# Patient Record
Sex: Female | Born: 1940 | Hispanic: Yes | Marital: Married | Smoking: Former smoker
Health system: Southern US, Community
[De-identification: ages and names within clinical notes are randomized; demographics above are authoritative.]

## PROBLEM LIST (undated history)

## (undated) DIAGNOSIS — M1990 Unspecified osteoarthritis, unspecified site: Secondary | ICD-10-CM

## (undated) DIAGNOSIS — K648 Other hemorrhoids: Secondary | ICD-10-CM

## (undated) DIAGNOSIS — R109 Unspecified abdominal pain: Secondary | ICD-10-CM

## (undated) DIAGNOSIS — K5790 Diverticulosis of intestine, part unspecified, without perforation or abscess without bleeding: Secondary | ICD-10-CM

## (undated) DIAGNOSIS — E785 Hyperlipidemia, unspecified: Secondary | ICD-10-CM

## (undated) DIAGNOSIS — K5730 Diverticulosis of large intestine without perforation or abscess without bleeding: Secondary | ICD-10-CM

## (undated) DIAGNOSIS — I10 Essential (primary) hypertension: Secondary | ICD-10-CM

## (undated) DIAGNOSIS — K2270 Barrett's esophagus without dysplasia: Secondary | ICD-10-CM

## (undated) DIAGNOSIS — K219 Gastro-esophageal reflux disease without esophagitis: Secondary | ICD-10-CM

## (undated) HISTORY — DX: Unspecified abdominal pain: R10.9

## (undated) HISTORY — PX: ABDOMINAL HYSTERECTOMY: SHX81

## (undated) HISTORY — DX: Unspecified osteoarthritis, unspecified site: M19.90

## (undated) HISTORY — DX: Barrett's esophagus without dysplasia: K22.70

## (undated) HISTORY — DX: Essential (primary) hypertension: I10

## (undated) HISTORY — PX: UPPER GASTROINTESTINAL ENDOSCOPY: SHX188

## (undated) HISTORY — DX: Hyperlipidemia, unspecified: E78.5

## (undated) HISTORY — DX: Diverticulosis of large intestine without perforation or abscess without bleeding: K57.30

## (undated) HISTORY — DX: Gastro-esophageal reflux disease without esophagitis: K21.9

## (undated) HISTORY — PX: LUMBAR LAMINECTOMY: SHX95

## (undated) HISTORY — PX: KNEE ARTHROSCOPY: SUR90

## (undated) HISTORY — PX: COLONOSCOPY: SHX174

## (undated) HISTORY — DX: Other hemorrhoids: K64.8

## (undated) HISTORY — DX: Diverticulosis of intestine, part unspecified, without perforation or abscess without bleeding: K57.90

---

## 1997-08-22 ENCOUNTER — Ambulatory Visit (HOSPITAL_COMMUNITY): Admission: RE | Admit: 1997-08-22 | Discharge: 1997-08-22 | Payer: Self-pay | Admitting: Internal Medicine

## 1997-10-31 ENCOUNTER — Ambulatory Visit (HOSPITAL_BASED_OUTPATIENT_CLINIC_OR_DEPARTMENT_OTHER): Admission: RE | Admit: 1997-10-31 | Discharge: 1997-10-31 | Payer: Self-pay | Admitting: *Deleted

## 1998-02-20 ENCOUNTER — Encounter: Admission: RE | Admit: 1998-02-20 | Discharge: 1998-05-21 | Payer: Self-pay | Admitting: Internal Medicine

## 1999-09-28 ENCOUNTER — Encounter: Admission: RE | Admit: 1999-09-28 | Discharge: 1999-09-28 | Payer: Self-pay | Admitting: Internal Medicine

## 1999-09-28 ENCOUNTER — Encounter: Payer: Self-pay | Admitting: Internal Medicine

## 2000-05-17 ENCOUNTER — Encounter: Payer: Self-pay | Admitting: *Deleted

## 2000-05-17 ENCOUNTER — Ambulatory Visit (HOSPITAL_COMMUNITY): Admission: RE | Admit: 2000-05-17 | Discharge: 2000-05-17 | Payer: Self-pay | Admitting: *Deleted

## 2000-06-09 ENCOUNTER — Encounter: Payer: Self-pay | Admitting: *Deleted

## 2000-06-09 ENCOUNTER — Encounter: Admission: RE | Admit: 2000-06-09 | Discharge: 2000-06-09 | Payer: Self-pay | Admitting: *Deleted

## 2000-06-23 ENCOUNTER — Encounter: Admission: RE | Admit: 2000-06-23 | Discharge: 2000-06-23 | Payer: Self-pay | Admitting: *Deleted

## 2000-06-23 ENCOUNTER — Encounter: Payer: Self-pay | Admitting: *Deleted

## 2000-11-24 ENCOUNTER — Ambulatory Visit (HOSPITAL_COMMUNITY): Admission: RE | Admit: 2000-11-24 | Discharge: 2000-11-24 | Payer: Self-pay

## 2000-12-12 DIAGNOSIS — K2270 Barrett's esophagus without dysplasia: Secondary | ICD-10-CM

## 2000-12-12 HISTORY — DX: Barrett's esophagus without dysplasia: K22.70

## 2000-12-13 ENCOUNTER — Other Ambulatory Visit: Admission: RE | Admit: 2000-12-13 | Discharge: 2000-12-13 | Payer: Self-pay | Admitting: Gastroenterology

## 2000-12-13 ENCOUNTER — Encounter (INDEPENDENT_AMBULATORY_CARE_PROVIDER_SITE_OTHER): Payer: Self-pay

## 2002-11-21 ENCOUNTER — Ambulatory Visit (HOSPITAL_COMMUNITY): Admission: RE | Admit: 2002-11-21 | Discharge: 2002-11-21 | Payer: Self-pay | Admitting: *Deleted

## 2004-01-03 ENCOUNTER — Ambulatory Visit: Payer: Self-pay | Admitting: Internal Medicine

## 2004-06-26 ENCOUNTER — Ambulatory Visit: Payer: Self-pay | Admitting: Internal Medicine

## 2005-09-23 ENCOUNTER — Ambulatory Visit: Payer: Self-pay | Admitting: Internal Medicine

## 2005-11-12 ENCOUNTER — Ambulatory Visit: Payer: Self-pay | Admitting: Internal Medicine

## 2006-02-10 ENCOUNTER — Ambulatory Visit: Payer: Self-pay | Admitting: Internal Medicine

## 2006-03-15 ENCOUNTER — Ambulatory Visit: Payer: Self-pay | Admitting: Internal Medicine

## 2006-04-26 ENCOUNTER — Ambulatory Visit: Payer: Self-pay | Admitting: Internal Medicine

## 2006-04-26 LAB — CONVERTED CEMR LAB: Hgb A1c MFr Bld: 7.4 % — ABNORMAL HIGH (ref 4.6–6.0)

## 2006-07-26 ENCOUNTER — Ambulatory Visit: Payer: Self-pay | Admitting: Internal Medicine

## 2006-07-26 DIAGNOSIS — Z9889 Other specified postprocedural states: Secondary | ICD-10-CM | POA: Insufficient documentation

## 2006-07-26 DIAGNOSIS — E119 Type 2 diabetes mellitus without complications: Secondary | ICD-10-CM | POA: Insufficient documentation

## 2006-07-26 DIAGNOSIS — I10 Essential (primary) hypertension: Secondary | ICD-10-CM

## 2006-07-26 DIAGNOSIS — K5730 Diverticulosis of large intestine without perforation or abscess without bleeding: Secondary | ICD-10-CM

## 2006-07-26 DIAGNOSIS — E785 Hyperlipidemia, unspecified: Secondary | ICD-10-CM | POA: Insufficient documentation

## 2006-07-26 HISTORY — DX: Other specified postprocedural states: Z98.890

## 2006-07-26 HISTORY — DX: Essential (primary) hypertension: I10

## 2006-07-26 HISTORY — DX: Type 2 diabetes mellitus without complications: E11.9

## 2006-07-26 HISTORY — DX: Diverticulosis of large intestine without perforation or abscess without bleeding: K57.30

## 2006-10-26 ENCOUNTER — Encounter: Payer: Self-pay | Admitting: Internal Medicine

## 2006-12-12 ENCOUNTER — Encounter: Payer: Self-pay | Admitting: Internal Medicine

## 2007-02-16 ENCOUNTER — Ambulatory Visit: Payer: Self-pay | Admitting: Internal Medicine

## 2007-02-16 LAB — CONVERTED CEMR LAB: Blood Glucose, Fingerstick: 227

## 2007-02-20 ENCOUNTER — Telehealth: Payer: Self-pay | Admitting: *Deleted

## 2007-02-21 ENCOUNTER — Encounter: Payer: Self-pay | Admitting: Internal Medicine

## 2007-03-24 ENCOUNTER — Ambulatory Visit: Payer: Self-pay | Admitting: Internal Medicine

## 2007-03-24 DIAGNOSIS — J069 Acute upper respiratory infection, unspecified: Secondary | ICD-10-CM | POA: Insufficient documentation

## 2007-05-16 ENCOUNTER — Ambulatory Visit: Payer: Self-pay | Admitting: Internal Medicine

## 2007-05-16 LAB — CONVERTED CEMR LAB
Albumin: 3.7 g/dL (ref 3.5–5.2)
Basophils Absolute: 0.1 10*3/uL (ref 0.0–0.1)
Blood Glucose, Fingerstick: 130
Cholesterol: 168 mg/dL (ref 0–200)
Creatinine,U: 140.2 mg/dL
Eosinophils Absolute: 0.2 10*3/uL (ref 0.0–0.6)
GFR calc Af Amer: 108 mL/min
GFR calc non Af Amer: 89 mL/min
HCT: 38.2 % (ref 36.0–46.0)
HDL: 55.3 mg/dL (ref >39.0)
Hemoglobin: 12.7 g/dL (ref 12.0–15.0)
Hgb A1c MFr Bld: 7.4 % — ABNORMAL HIGH (ref 4.6–6.0)
MCHC: 33.4 g/dL (ref 30.0–36.0)
MCV: 89.6 fL (ref 78.0–100.0)
Microalb, Ur: 1.1 mg/dL (ref 0.0–1.9)
Monocytes Absolute: 0.4 10*3/uL (ref 0.2–0.7)
Neutrophils Relative %: 51.9 % (ref 43.0–77.0)
Potassium: 4.2 meq/L (ref 3.5–5.1)
Sodium: 141 meq/L (ref 135–145)
TSH: 2.45 microintl units/mL (ref 0.35–5.50)
Total CHOL/HDL Ratio: 3

## 2007-07-21 ENCOUNTER — Telehealth (INDEPENDENT_AMBULATORY_CARE_PROVIDER_SITE_OTHER): Payer: Self-pay

## 2007-09-04 ENCOUNTER — Ambulatory Visit: Payer: Self-pay | Admitting: Internal Medicine

## 2007-09-04 DIAGNOSIS — R002 Palpitations: Secondary | ICD-10-CM

## 2007-09-04 DIAGNOSIS — M1990 Unspecified osteoarthritis, unspecified site: Secondary | ICD-10-CM | POA: Insufficient documentation

## 2007-09-04 HISTORY — DX: Palpitations: R00.2

## 2007-11-30 ENCOUNTER — Ambulatory Visit: Payer: Self-pay | Admitting: Internal Medicine

## 2008-02-27 ENCOUNTER — Ambulatory Visit: Payer: Self-pay | Admitting: Internal Medicine

## 2008-02-29 ENCOUNTER — Telehealth (INDEPENDENT_AMBULATORY_CARE_PROVIDER_SITE_OTHER): Payer: Self-pay

## 2008-02-29 ENCOUNTER — Telehealth (INDEPENDENT_AMBULATORY_CARE_PROVIDER_SITE_OTHER): Payer: Self-pay | Admitting: *Deleted

## 2008-03-21 ENCOUNTER — Telehealth: Payer: Self-pay | Admitting: Internal Medicine

## 2008-05-28 ENCOUNTER — Ambulatory Visit: Payer: Self-pay | Admitting: Internal Medicine

## 2008-06-03 ENCOUNTER — Telehealth: Payer: Self-pay | Admitting: Internal Medicine

## 2008-06-03 ENCOUNTER — Encounter: Payer: Self-pay | Admitting: Internal Medicine

## 2008-06-03 ENCOUNTER — Encounter: Admission: RE | Admit: 2008-06-03 | Discharge: 2008-09-01 | Payer: Self-pay | Admitting: Internal Medicine

## 2008-06-25 ENCOUNTER — Ambulatory Visit: Payer: Self-pay | Admitting: Internal Medicine

## 2008-06-25 DIAGNOSIS — K219 Gastro-esophageal reflux disease without esophagitis: Secondary | ICD-10-CM | POA: Insufficient documentation

## 2008-11-07 ENCOUNTER — Ambulatory Visit: Payer: Self-pay | Admitting: Internal Medicine

## 2008-11-07 LAB — CONVERTED CEMR LAB
Cholesterol: 188 mg/dL (ref 0–200)
Hgb A1c MFr Bld: 6.9 % — ABNORMAL HIGH (ref 4.6–6.5)

## 2009-01-17 ENCOUNTER — Ambulatory Visit: Payer: Self-pay | Admitting: Family Medicine

## 2009-01-17 DIAGNOSIS — H81399 Other peripheral vertigo, unspecified ear: Secondary | ICD-10-CM | POA: Insufficient documentation

## 2009-01-27 ENCOUNTER — Encounter: Payer: Self-pay | Admitting: Internal Medicine

## 2009-01-27 ENCOUNTER — Encounter (INDEPENDENT_AMBULATORY_CARE_PROVIDER_SITE_OTHER): Payer: Self-pay | Admitting: *Deleted

## 2009-01-29 ENCOUNTER — Telehealth: Payer: Self-pay | Admitting: Family Medicine

## 2009-03-10 ENCOUNTER — Ambulatory Visit: Payer: Self-pay | Admitting: Internal Medicine

## 2009-06-17 ENCOUNTER — Ambulatory Visit: Payer: Self-pay | Admitting: Internal Medicine

## 2009-07-29 ENCOUNTER — Telehealth: Payer: Self-pay | Admitting: Internal Medicine

## 2009-09-11 ENCOUNTER — Telehealth: Payer: Self-pay | Admitting: Internal Medicine

## 2009-09-23 ENCOUNTER — Ambulatory Visit: Payer: Self-pay | Admitting: Internal Medicine

## 2009-12-15 ENCOUNTER — Telehealth: Payer: Self-pay | Admitting: Internal Medicine

## 2009-12-26 ENCOUNTER — Ambulatory Visit: Payer: Self-pay | Admitting: Internal Medicine

## 2009-12-29 LAB — CONVERTED CEMR LAB
ALT: 51 units/L — ABNORMAL HIGH (ref 0–35)
AST: 38 units/L — ABNORMAL HIGH (ref 0–37)
Alkaline Phosphatase: 64 units/L (ref 39–117)
Calcium: 9.7 mg/dL (ref 8.4–10.5)
Creatinine,U: 23.5 mg/dL
Eosinophils Relative: 2.9 % (ref 0.0–5.0)
GFR calc non Af Amer: 79.22 mL/min (ref >60)
HCT: 36.9 % (ref 36.0–46.0)
Hemoglobin: 12.6 g/dL (ref 12.0–15.0)
LDL Cholesterol: 102 mg/dL — ABNORMAL HIGH (ref 0–99)
Lymphocytes Relative: 37.6 % (ref 12.0–46.0)
Lymphs Abs: 3.1 10*3/uL (ref 0.7–4.0)
Microalb Creat Ratio: 1.3 mg/g (ref 0.0–30.0)
Monocytes Relative: 5.2 % (ref 3.0–12.0)
Neutro Abs: 4.4 10*3/uL (ref 1.4–7.7)
Platelets: 228 10*3/uL (ref 150.0–400.0)
Potassium: 5 meq/L (ref 3.5–5.1)
Sodium: 140 meq/L (ref 135–145)
TSH: 3.14 microintl units/mL (ref 0.35–5.50)
Total Bilirubin: 0.4 mg/dL (ref 0.3–1.2)
VLDL: 25.4 mg/dL (ref 0.0–40.0)
WBC: 8.3 10*3/uL (ref 4.5–10.5)

## 2010-02-25 ENCOUNTER — Telehealth: Payer: Self-pay | Admitting: Internal Medicine

## 2010-03-27 ENCOUNTER — Other Ambulatory Visit: Payer: Self-pay | Admitting: Internal Medicine

## 2010-03-27 ENCOUNTER — Ambulatory Visit
Admission: RE | Admit: 2010-03-27 | Discharge: 2010-03-27 | Payer: Self-pay | Source: Home / Self Care | Attending: Internal Medicine | Admitting: Internal Medicine

## 2010-03-27 LAB — HEMOGLOBIN A1C: Hgb A1c MFr Bld: 8 % — ABNORMAL HIGH (ref 4.6–6.5)

## 2010-03-29 ENCOUNTER — Encounter: Payer: Self-pay | Admitting: Internal Medicine

## 2010-04-01 ENCOUNTER — Telehealth: Payer: Self-pay

## 2010-04-02 ENCOUNTER — Ambulatory Visit
Admission: RE | Admit: 2010-04-02 | Discharge: 2010-04-02 | Payer: Self-pay | Source: Home / Self Care | Attending: Internal Medicine | Admitting: Internal Medicine

## 2010-04-02 ENCOUNTER — Encounter: Payer: Self-pay | Admitting: Internal Medicine

## 2010-04-02 DIAGNOSIS — R079 Chest pain, unspecified: Secondary | ICD-10-CM | POA: Insufficient documentation

## 2010-04-03 ENCOUNTER — Ambulatory Visit: Payer: Self-pay | Admitting: Internal Medicine

## 2010-04-03 ENCOUNTER — Other Ambulatory Visit: Payer: Self-pay | Admitting: Internal Medicine

## 2010-04-03 ENCOUNTER — Ambulatory Visit
Admission: RE | Admit: 2010-04-03 | Discharge: 2010-04-03 | Payer: Self-pay | Source: Home / Self Care | Attending: Internal Medicine | Admitting: Internal Medicine

## 2010-04-03 DIAGNOSIS — R799 Abnormal finding of blood chemistry, unspecified: Secondary | ICD-10-CM | POA: Insufficient documentation

## 2010-04-03 LAB — CREATININE, SERUM: Creatinine, Ser: 0.7 mg/dL (ref 0.4–1.2)

## 2010-04-03 LAB — BUN: BUN: 16 mg/dL (ref 6–23)

## 2010-04-05 LAB — CONVERTED CEMR LAB: Hgb A1c MFr Bld: 8.2 % — ABNORMAL HIGH (ref 4.6–6.0)

## 2010-04-07 NOTE — Assessment & Plan Note (Signed)
Summary: 3 month rov/njr   Vital Signs:  Patient profile:   70 year old female Weight:      175 pounds Temp:     98.3 degrees F oral BP sitting:   142 / 78  (left arm) Cuff size:   regular  Vitals Entered By: Raechel Ache, RN (March 10, 2009 8:36 AM) CC: 3 mo ROV, FBS 182. Is Patient Diabetic? Yes   CC:  3 mo ROV and FBS 182.Marland Kitchen  History of Present Illness: 70 year old patient who is seen today for follow up of her type 2 diabetes.  She has hypertension and dyslipidemia.  She admits to some dietary noncompliance or the holidays and feels that her blood sugars have tended up.  Her last hemoglobin A1c6.9.  She remains on statin therapy for dyslipidemia, which she continues to tolerate well.  She has treated hypertension that has been stable.  She denies any cardiopulmonary complaints.  Allergies: 1)  Penicillin G Potassium (Penicillin G Potassium)  Past History:  Past Medical History: Reviewed history from 06/25/2008 and no changes required. Diabetes mellitus, type II Hyperlipidemia Hypertension Diverticulosis, colon Osteoarthritis GERD  Past Surgical History: Reviewed history from 09/04/2007 and no changes required. Hysterectomy left salpingo-oophorectomy 1980 C-section x 3 Lumbar laminectomy 1989 arthroscopic left knee surgery 1999  colonoscopy 2002  Review of Systems  The patient denies anorexia, fever, weight loss, weight gain, vision loss, decreased hearing, hoarseness, chest pain, syncope, dyspnea on exertion, peripheral edema, prolonged cough, headaches, hemoptysis, abdominal pain, melena, hematochezia, severe indigestion/heartburn, hematuria, incontinence, genital sores, muscle weakness, suspicious skin lesions, transient blindness, difficulty walking, depression, unusual weight change, abnormal bleeding, enlarged lymph nodes, angioedema, and breast masses.    Physical Exam  General:  overweight-appearing.  122/78overweight-appearing.   Head:  Normocephalic  and atraumatic without obvious abnormalities. No apparent alopecia or balding. Eyes:  No corneal or conjunctival inflammation noted. EOMI. Perrla. Funduscopic exam benign, without hemorrhages, exudates or papilledema. Vision grossly normal. Mouth:  Oral mucosa and oropharynx without lesions or exudates.  Teeth in good repair. Neck:  No deformities, masses, or tenderness noted. Lungs:  Normal respiratory effort, chest expands symmetrically. Lungs are clear to auscultation, no crackles or wheezes. Heart:  Normal rate and regular rhythm. S1 and S2 normal without gallop, murmur, click, rub or other extra sounds. Abdomen:  Bowel sounds positive,abdomen soft and non-tender without masses, organomegaly or hernias noted. Msk:  No deformity or scoliosis noted of thoracic or lumbar spine.   Pulses:  R and L carotid,radial,femoral,dorsalis pedis and posterior tibial pulses are full and equal bilaterally Extremities:  No clubbing, cyanosis, edema, or deformity noted with normal full range of motion of all joints.   Skin:  Intact without suspicious lesions or rashes Cervical Nodes:  No lymphadenopathy noted   Impression & Recommendations:  Problem # 1:  HYPERTENSION (ICD-401.9)  Her updated medication list for this problem includes:    Benazepril-hydrochlorothiazide 20-12.5 Mg Tabs (Benazepril-hydrochlorothiazide) .Marland Kitchen... 1 once daily  Her updated medication list for this problem includes:    Benazepril-hydrochlorothiazide 20-12.5 Mg Tabs (Benazepril-hydrochlorothiazide) .Marland Kitchen... 1 once daily  Problem # 2:  HYPERLIPIDEMIA (ICD-272.4)  Her updated medication list for this problem includes:    Pravastatin Sodium 40 Mg Tabs (Pravastatin sodium) ..... One daily  Her updated medication list for this problem includes:    Pravastatin Sodium 40 Mg Tabs (Pravastatin sodium) ..... One daily  Problem # 3:  DIABETES MELLITUS, TYPE II (ICD-250.00)  Her updated medication list for this problem includes:  Amaryl  4 Mg Tabs (Glimepiride) .Marland Kitchen... 1 tablet two times a day    Benazepril-hydrochlorothiazide 20-12.5 Mg Tabs (Benazepril-hydrochlorothiazide) .Marland Kitchen... 1 once daily    Metformin Hcl 1000 Mg Tabs (Metformin hcl) .Marland Kitchen... 1 two times a day    Her updated medication list for this problem includes:    Amaryl 4 Mg Tabs (Glimepiride) .Marland Kitchen... 1 tablet two times a day    Benazepril-hydrochlorothiazide 20-12.5 Mg Tabs (Benazepril-hydrochlorothiazide) .Marland Kitchen... 1 once daily    Metformin Hcl 1000 Mg Tabs (Metformin hcl) .Marland Kitchen... 1 two times a day  Complete Medication List: 1)  Amaryl 4 Mg Tabs (Glimepiride) .Marland Kitchen.. 1 tablet two times a day 2)  Protonix 40 Mg Tbec (Pantoprazole sodium) .... Take 1 tablet by mouth twice a day 3)  Levsin 0.125 Mg/57ml Elix (Hyoscyamine sulfate) .Marland Kitchen.. 1 q6h as needed 4)  Loratadine Allergy Relief D-12 5-120 Mg Tb12 (Loratadine-pseudoephedrine) .... One twice daily 5)  Freestyle Test Strp (Glucose blood) .... Use two times a day 6)  Benazepril-hydrochlorothiazide 20-12.5 Mg Tabs (Benazepril-hydrochlorothiazide) .Marland Kitchen.. 1 once daily 7)  Pravastatin Sodium 40 Mg Tabs (Pravastatin sodium) .... One daily 8)  Metformin Hcl 1000 Mg Tabs (Metformin hcl) .Marland Kitchen.. 1 two times a day 9)  Proair Hfa 108 (90 Base) Mcg/act Aers (Albuterol sulfate) .... 2 puffs q 4 hours for wheezing  Other Orders: Venipuncture (14782) TLB-A1C / Hgb A1C (Glycohemoglobin) (83036-A1C)  Patient Instructions: 1)  Please schedule a follow-up appointment in 3 months. 2)  Advised not to eat any food or drink any liquids after 10 PM the night before your procedure. 3)  Limit your Sodium (Salt). 4)  It is important that you exercise regularly at least 20 minutes 5 times a week. If you develop chest pain, have severe difficulty breathing, or feel very tired , stop exercising immediately and seek medical attention. 5)  You need to lose weight. Consider a lower calorie diet and regular exercise.  6)  Check your blood sugars regularly. If  your readings are usually above : or below 70 you should contact our office. 7)  It is important that your Diabetic A1c level is checked every 3 months. 8)  See your eye doctor yearly to check for diabetic eye damage. Prescriptions: METFORMIN HCL 1000 MG TABS (METFORMIN HCL) 1 two times a day  #180 x 3   Entered and Authorized by:   Gordy Savers  MD   Signed by:   Gordy Savers  MD on 03/10/2009   Method used:   Print then Give to Patient   RxID:   [id number] PRAVASTATIN SODIUM 40 MG TABS (PRAVASTATIN SODIUM) one daily  #90 x 6   Entered and Authorized by:   Gordy Savers  MD   Signed by:   Gordy Savers  MD on 03/10/2009   Method used:   Print then Give to Patient   RxID:   [id number] BENAZEPRIL-HYDROCHLOROTHIAZIDE 20-12.5 MG TABS (BENAZEPRIL-HYDROCHLOROTHIAZIDE) 1 once daily  #90 x 6   Entered and Authorized by:   Gordy Savers  MD   Signed by:   Gordy Savers  MD on 03/10/2009   Method used:   Print then Give to Patient   RxID:   [id number] FREESTYLE TEST   STRP (GLUCOSE BLOOD) use two times a day  #180 x 6   Entered and Authorized by:   Gordy Savers  MD   Signed by:   Gordy Savers  MD on 03/10/2009   Method used:  Print then Give to Patient   RxID:   [id number] LORATADINE ALLERGY RELIEF D-12 5-120 MG  TB12 (LORATADINE-PSEUDOEPHEDRINE) one twice daily  #30 x 5   Entered and Authorized by:   Gordy Savers  MD   Signed by:   Gordy Savers  MD on 03/10/2009   Method used:   Print then Give to Patient   RxID:   [id number] LEVSIN 0.125 MG/5ML  ELIX (HYOSCYAMINE SULFATE) 1 q6h as needed  #50 Each x 4   Entered and Authorized by:   Gordy Savers  MD   Signed by:   Gordy Savers  MD on 03/10/2009   Method used:   Print then Give to Patient   RxID:   [id number] PROTONIX 40 MG TBEC (PANTOPRAZOLE SODIUM) Take 1 tablet by mouth twice a day  #90 x 6   Entered and Authorized by:    Gordy Savers  MD   Signed by:   Gordy Savers  MD on 03/10/2009   Method used:   Print then Give to Patient   RxID:   [id number] AMARYL 4 MG TABS (GLIMEPIRIDE) 1 tablet two times a day  #180 x 6   Entered and Authorized by:   Gordy Savers  MD   Signed by:   Gordy Savers  MD on 03/10/2009   Method used:   Print then Give to Patient   RxID:   [id number]

## 2010-04-07 NOTE — Assessment & Plan Note (Signed)
Summary: pt will come in fasting/njr   Vital Signs:  Patient profile:   70 year old female Height:      62 inches Weight:      174 pounds BMI:     31.94 Temp:     98.4 degrees F oral Pulse rate:   72 / minute Resp:     14 per minute BP sitting:   140 / 90  (left arm)  Vitals Entered By: Willy Eddy, LPN (December 26, 2009 9:27 AM) CC: annual visit for disease managment-pt is unsure of metformin doesnt know if she take 500 or 1000-cbg 166--declines pelvic exam Is Patient Diabetic? Yes Did you bring your meter with you today? No   CC:  annual visit for disease managment-pt is unsure of metformin doesnt know if she take 500 or 1000-cbg 166--declines pelvic exam.  History of Present Illness: 70 year old patient seen today for a wellness exam.  Medical problems include type 2 diabetes, hypertension, and dyslipidemia.  Her last hemoglobin A1c was 8.0.  There is been some very modest weight loss.  She has osteoarthritis, gastroesophageal reflux disease.  Here for Medicare AWV:  1.   Risk factors based on Past M, S, F history:cardiovascular risk factors include diabetes, hypertension, dyslipidemia 2.   Physical Activities: no activity restrictions, but no regular exercise program.  Walks occasionally 3.   Depression/mood: no history depression, or mood disorder 4.   Hearing: no hearing deficits 5.   ADL's: independent in all aspects of the daily living 6.   Fall Risk: low 7.   Home Safety: Toprol is identified 8.   Height, weight, &visual acuity:height and weight stable.  No difficulty with visual acuity 9.   Counseling: follow-up eye examination and encouraged 10.   Labs ordered based on risk factors: laboratory profile, including lipid panel, and hemoglobin A1c will be reviewed 11.           Referral Coordination- ophthalmology referral 12.           Care Plan- heart healthy diet, exercise and weight loss.  All encouraged 13.            Cognitive Assessment- alert and oriented  normal affect.  No difficulty with short-term memory.  Able to handle all executive functioning without difficulty   Preventive Screening-Counseling & Management  Alcohol-Tobacco     Smoking Status: quit  Current Problems (verified): 1)  Vertigo, Peripheral  (ICD-386.10) 2)  Gerd  (ICD-530.81) 3)  Palpitations, Occasional  (ICD-785.1) 4)  Osteoarthritis  (ICD-715.90) 5)  Uri  (ICD-465.9) 6)  Diverticulosis, Colon  (ICD-562.10) 7)  Arthroscopy, Left Knee, Hx of  (ICD-V45.89) 8)  Hypertension  (ICD-401.9) 9)  Hyperlipidemia  (ICD-272.4) 10)  Diabetes Mellitus, Type II  (ICD-250.00)  Current Medications (verified): 1)  Amaryl 4 Mg Tabs (Glimepiride) .Marland Kitchen.. 1 Tablet Two Times A Day 2)  Protonix 40 Mg Tbec (Pantoprazole Sodium) .... Take 1 Tablet By Mouth Twice A Day 3)  Levsin 0.125 Mg/3ml  Elix (Hyoscyamine Sulfate) .Marland Kitchen.. 1 Q6h As Needed 4)  Loratadine Allergy Relief D-12 5-120 Mg  Tb12 (Loratadine-Pseudoephedrine) .... One Twice Daily 5)  Freestyle Test   Strp (Glucose Blood) .... Use Two Times A Day 6)  Benazepril-Hydrochlorothiazide 20-12.5 Mg Tabs (Benazepril-Hydrochlorothiazide) .Marland Kitchen.. 1 Once Daily 7)  Pravastatin Sodium 40 Mg Tabs (Pravastatin Sodium) .... One Daily 8)  Metformin Hcl 1000 Mg Tabs (Metformin Hcl) .Marland Kitchen.. 1 Two Times A Day 9)  Proair Hfa 108 (90 Base) Mcg/act Aers (Albuterol Sulfate) .Marland KitchenMarland KitchenMarland Kitchen  2 Puffs Q 4 Hours For Wheezing  Allergies (verified): 1)  Penicillin G Potassium (Penicillin G Potassium)  Past History:  Past Medical History: Reviewed history from 06/25/2008 and no changes required. Diabetes mellitus, type II Hyperlipidemia Hypertension Diverticulosis, colon Osteoarthritis GERD  Past Surgical History: Reviewed history from 09/04/2007 and no changes required. Hysterectomy left salpingo-oophorectomy 1980 C-section x 3 Lumbar laminectomy 1989 arthroscopic left knee surgery 1999  colonoscopy 2002  Family History: Reviewed history from 09/23/2009 and no  changes required. father died age 68, complications of cardiac and cerebrovascular disease mother, age 56 Brother died of complications of DM  Social History: Reviewed history from 02/27/2008 and no changes required. Married Never Smoked  Review of Systems  The patient denies anorexia, fever, weight loss, weight gain, vision loss, decreased hearing, hoarseness, chest pain, syncope, dyspnea on exertion, peripheral edema, prolonged cough, headaches, hemoptysis, abdominal pain, melena, hematochezia, severe indigestion/heartburn, hematuria, incontinence, genital sores, muscle weakness, suspicious skin lesions, transient blindness, difficulty walking, depression, unusual weight change, abnormal bleeding, enlarged lymph nodes, angioedema, and breast masses.         Flu Vaccine Consent Questions     Do you have a history of severe allergic reactions to this vaccine? no    Any prior history of allergic reactions to egg and/or gelatin? no    Do you have a sensitivity to the preservative Thimersol? no    Do you have a past history of Guillan-Barre Syndrome? no    Do you currently have an acute febrile illness? no    Have you ever had a severe reaction to latex? no    Vaccine information given and explained to patient? yes    Are you currently pregnant? no    Lot Number:[id number]   Exp Date:09/05/2010   Site Given  Left Deltoid IM   Physical Exam  General:  overweight-appearing.  normal blood pressure Head:  Normocephalic and atraumatic without obvious abnormalities. No apparent alopecia or balding. Eyes:  No corneal or conjunctival inflammation noted. EOMI. Perrla. Funduscopic exam benign, without hemorrhages, exudates or papilledema. Vision grossly normal. Ears:  External ear exam shows no significant lesions or deformities.  Otoscopic examination reveals clear canals, tympanic membranes are intact bilaterally without bulging, retraction, inflammation or discharge. Hearing is grossly normal  bilaterally. Nose:  External nasal examination shows no deformity or inflammation. Nasal mucosa are pink and moist without lesions or exudates. Mouth:  Oral mucosa and oropharynx without lesions or exudates.  Teeth in good repair. Neck:  No deformities, masses, or tenderness noted. Chest Wall:  No deformities, masses, or tenderness noted. Breasts:  No mass, nodules, thickening, tenderness, bulging, retraction, inflamation, nipple discharge or skin changes noted.   Lungs:  Normal respiratory effort, chest expands symmetrically. Lungs are clear to auscultation, no crackles or wheezes. Heart:  Normal rate and regular rhythm. S1 and S2 normal without gallop, murmur, click, rub or other extra sounds. Abdomen:  Bowel sounds positive,abdomen soft and non-tender without masses, organomegaly or hernias noted. Msk:  No deformity or scoliosis noted of thoracic or lumbar spine.   Pulses:  R and L carotid,radial,femoral,dorsalis pedis and posterior tibial pulses are full and equal bilaterally Extremities:  No clubbing, cyanosis, edema, or deformity noted with normal full range of motion of all joints.   Neurologic:  No cranial nerve deficits noted. Station and gait are normal. Plantar reflexes are down-going bilaterally. DTRs are symmetrical throughout. Sensory, motor and coordinative functions appear intact. Skin:  Intact without suspicious lesions or rashes Cervical Nodes:  No lymphadenopathy noted Axillary Nodes:  No palpable lymphadenopathy Inguinal Nodes:  No significant adenopathy Psych:  Cognition and judgment appear intact. Alert and cooperative with normal attention span and concentration. No apparent delusions, illusions, hallucinations  Diabetes Management Exam:    Foot Exam (with socks and/or shoes not present):       Sensory-Pinprick/Light touch:          Left medial foot (L-4): normal          Left dorsal foot (L-5): normal          Left lateral foot (S-1): normal          Right medial foot  (L-4): normal          Right dorsal foot (L-5): normal          Right lateral foot (S-1): normal       Sensory-Monofilament:          Left foot: normal          Right foot: normal       Inspection:          Left foot: normal          Right foot: normal       Nails:          Left foot: normal          Right foot: normal    Foot Exam by Podiatrist:       Date: 12/26/2009       Results: no diabetic findings       Done by: PCP    Eye Exam:       Eye Exam done here today          Results: normal   Impression & Recommendations:  Problem # 1:  Preventive Health Care (ICD-V70.0)  Orders: Medicare -1st Annual Wellness Visit [phone])  Complete Medication List: 1)  Amaryl 4 Mg Tabs (Glimepiride) .Marland Kitchen.. 1 tablet two times a day 2)  Protonix 40 Mg Tbec (Pantoprazole sodium) .... Take 1 tablet by mouth twice a day 3)  Levsin 0.125 Mg/97ml Elix (Hyoscyamine sulfate) .Marland Kitchen.. 1 q6h as needed 4)  Loratadine Allergy Relief D-12 5-120 Mg Tb12 (Loratadine-pseudoephedrine) .... One twice daily 5)  Freestyle Test Strp (Glucose blood) .... Use two times a day 6)  Benazepril-hydrochlorothiazide 20-12.5 Mg Tabs (Benazepril-hydrochlorothiazide) .Marland Kitchen.. 1 once daily 7)  Pravastatin Sodium 40 Mg Tabs (Pravastatin sodium) .... One daily 8)  Metformin Hcl 1000 Mg Tabs (Metformin hcl) .Marland Kitchen.. 1 two times a day 9)  Proair Hfa 108 (90 Base) Mcg/act Aers (Albuterol sulfate) .... 2 puffs q 4 hours for wheezing  Other Orders: Flu Vaccine 76yrs + MEDICARE PATIENTS (C3762) Administration Flu vaccine - MCR (G0008) Tdap => 96yrs IM (83151) Admin 1st Vaccine (76160) Pneumococcal Vaccine (73710) Admin of Any Addtl Vaccine (62694) Venipuncture (85462) TLB-Lipid Panel (80061-LIPID) TLB-BMP (Basic Metabolic Panel-BMET) (80048-METABOL) TLB-CBC Platelet - w/Differential (85025-CBCD) TLB-Hepatic/Liver Function Pnl (80076-HEPATIC) TLB-TSH (Thyroid Stimulating Hormone) (84443-TSH) TLB-A1C / Hgb A1C (Glycohemoglobin)  (83036-A1C) TLB-Microalbumin/Creat Ratio, Urine (82043-MALB) Specimen Handling (70350)  Patient Instructions: 1)  Please schedule a follow-up appointment in 3 months. 2)  Limit your Sodium (Salt). 3)  It is important that you exercise regularly at least 20 minutes 5 times a week. If you develop chest pain, have severe difficulty breathing, or feel very tired , stop exercising immediately and seek medical attention. 4)  You need to lose weight. Consider a lower calorie diet and regular exercise.  5)  Check  your blood sugars regularly. If your readings are usually above : or below 70 you should contact our office. 6)  It is important that your Diabetic A1c level is checked every 3 months. 7)  See your eye doctor yearly to check for diabetic eye damage. Prescriptions: METFORMIN HCL 1000 MG TABS (METFORMIN HCL) 1 two times a day  #180 x 3   Entered and Authorized by:   Gordy Savers  MD   Signed by:   Gordy Savers  MD on 12/26/2009   Method used:   Electronically to        [facility] [address] (retail)       [phone]       [place], [place]  [address]       Ph: [phone]       Fax: [phone]   RxID:   [id number] PRAVASTATIN SODIUM 40 MG TABS (PRAVASTATIN SODIUM) one daily  #90 x 4   Entered and Authorized by:   Gordy Savers  MD   Signed by:   Gordy Savers  MD on 12/26/2009   Method used:   Electronically to        [facility] [address] (retail)       [phone]       [place], [place]  [address]       Ph: [phone]       Fax: [phone]   RxID:   [id number] BENAZEPRIL-HYDROCHLOROTHIAZIDE 20-12.5 MG TABS (BENAZEPRIL-HYDROCHLOROTHIAZIDE) 1 once daily  #90 x 6   Entered and Authorized by:   Gordy Savers  MD   Signed by:   Gordy Savers  MD on 12/26/2009   Method used:   Electronically to        [facility] [address] (retail)       [phone]       [place], [place]  [address]       Ph: [phone]       Fax: [phone]   RxID:   [id number] FREESTYLE TEST   STRP (GLUCOSE BLOOD) use two times a day  #180 x 6   Entered and Authorized by:   Gordy Savers  MD   Signed by:   Gordy Savers  MD on 12/26/2009   Method used:   Electronically to        [facility] [address] (retail)       [phone]       [place], [place]  [address]       Ph: [phone]       Fax: [phone]   RxID:   [id number] LEVSIN 0.125 MG/5ML  ELIX (HYOSCYAMINE SULFATE) 1 q6h as needed  #50 Each x 4   Entered and Authorized by:   Gordy Savers  MD   Signed by:   Gordy Savers  MD on 12/26/2009   Method used:   Electronically to        [facility] [address] (retail)       [phone]       [place], [place]  [address]       Ph: [phone]       Fax: [phone]   RxID:  [id number] PROTONIX 40 MG TBEC (PANTOPRAZOLE SODIUM) Take 1 tablet by mouth twice a day  #90 x 6   Entered and Authorized by:   Gordy Savers  MD   Signed by:   Gordy Savers  MD on 12/26/2009   Method used:   Electronically to        [facility] [address] (retail)       [phone]       [place], [place]  [address]       Ph: [phone]       Fax: [phone]   RxID:   [id number] AMARYL 4 MG TABS (GLIMEPIRIDE) 1 tablet two times a day  #180 x 4   Entered and Authorized by:   Gordy Savers  MD   Signed by:   Gordy Savers  MD on 12/26/2009   Method used:   Electronically to        [facility] [address] (retail)       [phone]       [place], [place]  [address]       Ph: [phone]       Fax: [phone]   RxID:   [id number]    Orders Added: 1)  Flu Vaccine 20yrs + MEDICARE PATIENTS [Q2039] 2)  Administration Flu vaccine -  MCR [G0008] 3)  Tdap => 48yrs IM [90715] 4)  Admin 1st Vaccine [90471] 5)  Pneumococcal Vaccine [90732] 6)  Admin of Any Addtl Vaccine [90472] 7)  Medicare -1st Annual Wellness Visit [G0438] 8)  Est. Patient Level III [[address]] 9)  Venipuncture [36415] 10)  TLB-Lipid Panel [80061-LIPID] 11)  TLB-BMP (Basic Metabolic Panel-BMET) [80048-METABOL] 12)  TLB-CBC Platelet - w/Differential [85025-CBCD] 13)  TLB-Hepatic/Liver Function Pnl [80076-HEPATIC] 14)  TLB-TSH (Thyroid Stimulating Hormone) [84443-TSH] 15)  TLB-A1C / Hgb A1C (Glycohemoglobin) [83036-A1C] 16)  TLB-Microalbumin/Creat Ratio, Urine [82043-MALB] 17)  Specimen Handling [99000]   Immunizations Administered:  Tetanus Vaccine:    Vaccine Type: Tdap    Site: right deltoid    Mfr: GlaxoSmithKline    Dose: 0.5 ml    Route: IM    Given by: Willy Eddy, LPN    Exp. Date: 12/26/2011    Lot #: [id number]    VIS given: 01/24/08 version given December 26, 2009.  Pneumonia Vaccine:    Vaccine Type: Pneumovax (Medicare)    Site: left deltoid    Mfr: Merck    Dose: 0.5 ml    Route: IM    Given by: Willy Eddy, LPN    Exp. Date: 06/30/2010    Lot #: [id number]    VIS given: 02/10/09 version given December 26, 2009.   Immunizations Administered:  Tetanus Vaccine:    Vaccine Type: Tdap    Site: right deltoid    Mfr: GlaxoSmithKline    Dose: 0.5 ml    Route: IM    Given by: Willy Eddy, LPN    Exp. Date: 12/26/2011    Lot #: [id number]    VIS given: 01/24/08 version given December 26, 2009.  Pneumonia Vaccine:    Vaccine Type: Pneumovax (Medicare)    Site: left deltoid    Mfr: Merck    Dose: 0.5 ml    Route: IM    Given by: Willy Eddy, LPN    Exp. Date: 06/30/2010    Lot #: [id number]    VIS  given: 02/10/09 version given December 26, 2009.

## 2010-04-07 NOTE — Assessment & Plan Note (Signed)
Summary: 3 MTH ROV // RS   Vital Signs:  Patient profile:   70 year old female Weight:      177 pounds Temp:     98.1 degrees F oral BP sitting:   126 / 74  (right arm) Cuff size:   regular  Vitals Entered By: Duard Brady LPN (June 17, 2009 10:53 AM) CC: 3 mos rov - - doing ok  , c/o (L) foot/heal pain    fbs 193 Is Patient Diabetic? Yes Did you bring your meter with you today? No   CC:  3 mos rov - - doing ok   and c/o (L) foot/heal pain    fbs 193.  History of Present Illness: 70 year old patient who is seen today for follow-up of her diabetes.  She is treated hypertension and dyslipidemia.  She is doing well.  Her last hemoglobin A1c was up to 7.6 from 6.9.  She is going to school two to 3 times per week and having cereal at bedtime.  Her weight is up a few pounds.  Otherwise, she feels well except for some left foot and heel pain.  Preventive Screening-Counseling & Management  Alcohol-Tobacco     Smoking Status: quit  Allergies: 1)  Penicillin G Potassium (Penicillin G Potassium)  Past History:  Past Medical History: Reviewed history from 06/25/2008 and no changes required. Diabetes mellitus, type II Hyperlipidemia Hypertension Diverticulosis, colon Osteoarthritis GERD  Social History: Smoking Status:  quit  Review of Systems       The patient complains of difficulty walking.  The patient denies anorexia, fever, weight loss, weight gain, vision loss, decreased hearing, hoarseness, chest pain, syncope, dyspnea on exertion, peripheral edema, prolonged cough, headaches, hemoptysis, abdominal pain, melena, hematochezia, severe indigestion/heartburn, hematuria, incontinence, genital sores, muscle weakness, suspicious skin lesions, transient blindness, depression, unusual weight change, abnormal bleeding, enlarged lymph nodes, angioedema, and breast masses.    Physical Exam  General:  overweight-appearing.  low-normal blood pressureoverweight-appearing.     Head:  Normocephalic and atraumatic without obvious abnormalities. No apparent alopecia or balding. Eyes:  No corneal or conjunctival inflammation noted. EOMI. Perrla. Funduscopic exam benign, without hemorrhages, exudates or papilledema. Vision grossly normal. Mouth:  Oral mucosa and oropharynx without lesions or exudates.  Teeth in good repair. Neck:  No deformities, masses, or tenderness noted. Lungs:  Normal respiratory effort, chest expands symmetrically. Lungs are clear to auscultation, no crackles or wheezes. Heart:  Normal rate and regular rhythm. S1 and S2 normal without gallop, murmur, click, rub or other extra sounds. Abdomen:  Bowel sounds positive,abdomen soft and non-tender without masses, organomegaly or hernias noted. Msk:  No deformity or scoliosis noted of thoracic or lumbar spine.   Pulses:  R and L carotid,radial,femoral,dorsalis pedis and posterior tibial pulses are full and equal bilaterally Extremities:  No clubbing, cyanosis, edema, or deformity noted with normal full range of motion of all joints.     Impression & Recommendations:  Problem # 1:  HYPERTENSION (ICD-401.9)  Her updated medication list for this problem includes:    Benazepril-hydrochlorothiazide 20-12.5 Mg Tabs (Benazepril-hydrochlorothiazide) .Marland Kitchen... 1 once daily  Her updated medication list for this problem includes:    Benazepril-hydrochlorothiazide 20-12.5 Mg Tabs (Benazepril-hydrochlorothiazide) .Marland Kitchen... 1 once daily  Problem # 2:  DIABETES MELLITUS, TYPE II (ICD-250.00)  Her updated medication list for this problem includes:    Amaryl 4 Mg Tabs (Glimepiride) .Marland Kitchen... 1 tablet two times a day    Benazepril-hydrochlorothiazide 20-12.5 Mg Tabs (Benazepril-hydrochlorothiazide) .Marland Kitchen... 1 once daily  Metformin Hcl 1000 Mg Tabs (Metformin hcl) .Marland Kitchen... 1 two times a day    Her updated medication list for this problem includes:    Amaryl 4 Mg Tabs (Glimepiride) .Marland Kitchen... 1 tablet two times a day     Benazepril-hydrochlorothiazide 20-12.5 Mg Tabs (Benazepril-hydrochlorothiazide) .Marland Kitchen... 1 once daily    Metformin Hcl 1000 Mg Tabs (Metformin hcl) .Marland Kitchen... 1 two times a day  Complete Medication List: 1)  Amaryl 4 Mg Tabs (Glimepiride) .Marland Kitchen.. 1 tablet two times a day 2)  Protonix 40 Mg Tbec (Pantoprazole sodium) .... Take 1 tablet by mouth twice a day 3)  Levsin 0.125 Mg/79ml Elix (Hyoscyamine sulfate) .Marland Kitchen.. 1 q6h as needed 4)  Loratadine Allergy Relief D-12 5-120 Mg Tb12 (Loratadine-pseudoephedrine) .... One twice daily 5)  Freestyle Test Strp (Glucose blood) .... Use two times a day 6)  Benazepril-hydrochlorothiazide 20-12.5 Mg Tabs (Benazepril-hydrochlorothiazide) .Marland Kitchen.. 1 once daily 7)  Pravastatin Sodium 40 Mg Tabs (Pravastatin sodium) .... One daily 8)  Metformin Hcl 1000 Mg Tabs (Metformin hcl) .Marland Kitchen.. 1 two times a day 9)  Proair Hfa 108 (90 Base) Mcg/act Aers (Albuterol sulfate) .... 2 puffs q 4 hours for wheezing  Other Orders: Venipuncture (78469) TLB-A1C / Hgb A1C (Glycohemoglobin) (83036-A1C)  Patient Instructions: 1)  Limit your Sodium (Salt). 2)  It is important that you exercise regularly at least 20 minutes 5 times a week. If you develop chest pain, have severe difficulty breathing, or feel very tired , stop exercising immediately and seek medical attention. 3)  You need to lose weight. Consider a lower calorie diet and regular exercise.  4)  Check your blood sugars regularly. If your readings are usually above : or below 70 you should contact our office. 5)  It is important that your Diabetic A1c level is checked every 3 months. 6)  See your eye doctor yearly to check for diabetic eye damage. 7)  Please schedule a follow-up appointment in 3 months. Prescriptions: PROAIR HFA 108 (90 BASE) MCG/ACT AERS (ALBUTEROL SULFATE) 2 puffs q 4 hours for wheezing  #1 x 0   Entered and Authorized by:   Gordy Savers  MD   Signed by:   Gordy Savers  MD on 06/17/2009   Method used:    Print then Give to Patient   RxID:   [id number] METFORMIN HCL 1000 MG TABS (METFORMIN HCL) 1 two times a day  #180 x 3   Entered and Authorized by:   Gordy Savers  MD   Signed by:   Gordy Savers  MD on 06/17/2009   Method used:   Print then Give to Patient   RxID:   [id number] PRAVASTATIN SODIUM 40 MG TABS (PRAVASTATIN SODIUM) one daily  #90 x 6   Entered and Authorized by:   Gordy Savers  MD   Signed by:   Gordy Savers  MD on 06/17/2009   Method used:   Print then Give to Patient   RxID:   [id number] BENAZEPRIL-HYDROCHLOROTHIAZIDE 20-12.5 MG TABS (BENAZEPRIL-HYDROCHLOROTHIAZIDE) 1 once daily  #90 x 6   Entered and Authorized by:   Gordy Savers  MD   Signed by:   Gordy Savers  MD on 06/17/2009   Method used:   Print then Give to Patient   RxID:   [id number] FREESTYLE TEST   STRP (GLUCOSE BLOOD) use two times a day  #180 x 6   Entered and Authorized by:   Gordy Savers  MD  Signed by:   Gordy Savers  MD on 06/17/2009   Method used:   Print then Give to Patient   RxID:   [id number] PROTONIX 40 MG TBEC (PANTOPRAZOLE SODIUM) Take 1 tablet by mouth twice a day  #90 x 6   Entered and Authorized by:   Gordy Savers  MD   Signed by:   Gordy Savers  MD on 06/17/2009   Method used:   Print then Give to Patient   RxID:   [id number] AMARYL 4 MG TABS (GLIMEPIRIDE) 1 tablet two times a day  #180 x 6   Entered and Authorized by:   Gordy Savers  MD   Signed by:   Gordy Savers  MD on 06/17/2009   Method used:   Print then Give to Patient   RxID:   [id number]

## 2010-04-07 NOTE — Assessment & Plan Note (Signed)
Summary: 3 month fup--will fast///ccm  a  Vital Signs:  [place] profile:   70 year old female Weight:      177 pounds Temp:     97.9 degrees F oral BP sitting:   110 / 70  (right arm) Cuff size:   regular  Vitals Entered By: Duard Brady LPN (September 23, 2009 8:13 AM) L.CC: 3 mos rov - doing ok , c/o stomach pain on / off     fbs 179 Is [place] Diabetic? Yes Did you bring your meter with you today? No   CC:  3 mos rov - doing ok  and c/o stomach pain on / off     fbs 179.  History of Present Illness: Briana [place] who is seen today for follow-up of her type 2 diabetes.  She has treated hypertension and dyslipidemia.  Her hemoglobin  A1c as have been in the 7.5 range recently. She lost someway temporarily, but has regained it back.  She does describe rare episodes of nocturnal diaphoresis.  She has not documented any hypoglycemia, but does get out of bed to have a snack.  Her brother recently died of apparent complications of hypoglycemia  Allergies: 1)  Penicillin G Potassium (Penicillin G Potassium)  Past History:  Past Medical History: Reviewed history from 06/25/2008 and no changes required. Diabetes mellitus, type II Hyperlipidemia Hypertension Diverticulosis, colon Osteoarthritis GERD  Family History: Reviewed history from 11/30/2007 and no changes required. father died age 28, complications of cardiac and cerebrovascular disease mother, age 48 Brother died of complications of DM  Social History: Reviewed history from 02/27/2008 and no changes required. Married Never Smoked  Review of Systems  The [place] denies anorexia, fever, weight loss, weight gain, vision loss, decreased hearing, hoarseness, chest pain, syncope, dyspnea on exertion, peripheral edema, prolonged cough, headaches, hemoptysis, abdominal pain, melena, hematochezia, severe indigestion/heartburn, hematuria, incontinence, genital sores, muscle weakness, suspicious skin lesions, transient  blindness, difficulty walking, depression, unusual weight change, abnormal bleeding, enlarged lymph nodes, angioedema, and breast masses.    Physical Exam  General:  overweight-appearing.  110/70overweight-appearing.   Head:  Normocephalic and atraumatic without obvious abnormalities. No apparent alopecia or balding. Eyes:  No corneal or conjunctival inflammation noted. EOMI. Perrla. Funduscopic exam benign, without hemorrhages, exudates or papilledema. Vision grossly normal. Ears:  External ear exam shows no significant lesions or deformities.  Otoscopic examination reveals clear canals, tympanic membranes are intact bilaterally without bulging, retraction, inflammation or discharge. Hearing is grossly normal bilaterally. Mouth:  Oral mucosa and oropharynx without lesions or exudates.  Teeth in good repair. Neck:  No deformities, masses, or tenderness noted. Lungs:  Normal respiratory effort, chest expands symmetrically. Lungs are clear to auscultation, no crackles or wheezes. Heart:  Normal rate and regular rhythm. S1 and S2 normal without gallop, murmur, click, rub or other extra sounds. Abdomen:  Bowel sounds positive,abdomen soft and non-tender without masses, organomegaly or hernias noted. Msk:  No deformity or scoliosis noted of thoracic or lumbar spine.   Pulses:  R and L carotid,radial,femoral,dorsalis pedis and posterior tibial pulses are full and equal bilaterally  Diabetes Management Exam:    Foot Exam (with socks and/or shoes not present):       Sensory-Pinprick/Light touch:          Left medial foot (L-4): normal          Left dorsal foot (L-5): normal          Left lateral foot (S-1): normal  Right medial foot (L-4): normal          Right dorsal foot (L-5): normal          Right lateral foot (S-1): normal       Sensory-Monofilament:          Left foot: normal          Right foot: normal       Inspection:          Left foot: normal          Right foot: normal        Nails:          Left foot: normal          Right foot: normal    Foot Exam by Podiatrist:       Date: 09/23/2009       Results: no diabetic findings       Done by: PCP   Impression & Recommendations:  Problem # 1:  HYPERTENSION (ICD-401.9)  Her updated medication list for this problem includes:    Benazepril-hydrochlorothiazide 20-12.5 Mg Tabs (Benazepril-hydrochlorothiazide) .Marland Kitchen... 1 once daily  Her updated medication list for this problem includes:    Benazepril-hydrochlorothiazide 20-12.5 Mg Tabs (Benazepril-hydrochlorothiazide) .Marland Kitchen... 1 once daily  Problem # 2:  HYPERLIPIDEMIA (ICD-272.4)  Her updated medication list for this problem includes:    Pravastatin Sodium 40 Mg Tabs (Pravastatin sodium) ..... One daily  Her updated medication list for this problem includes:    Pravastatin Sodium 40 Mg Tabs (Pravastatin sodium) ..... One daily  Problem # 3:  DIABETES MELLITUS, TYPE II (ICD-250.00)  Her updated medication list for this problem includes:    Amaryl 4 Mg Tabs (Glimepiride) .Marland Kitchen... 1 tablet two times a day    Benazepril-hydrochlorothiazide 20-12.5 Mg Tabs (Benazepril-hydrochlorothiazide) .Marland Kitchen... 1 once daily    Metformin Hcl 1000 Mg Tabs (Metformin hcl) .Marland Kitchen... 1 two times a day    Her updated medication list for this problem includes:    Amaryl 4 Mg Tabs (Glimepiride) .Marland Kitchen... 1 tablet two times a day    Benazepril-hydrochlorothiazide 20-12.5 Mg Tabs (Benazepril-hydrochlorothiazide) .Marland Kitchen... 1 once daily    Metformin Hcl 1000 Mg Tabs (Metformin hcl) .Marland Kitchen... 1 two times a day  Complete Medication List: 1)  Amaryl 4 Mg Tabs (Glimepiride) .Marland Kitchen.. 1 tablet two times a day 2)  Protonix 40 Mg Tbec (Pantoprazole sodium) .... Take 1 tablet by mouth twice a day 3)  Levsin 0.125 Mg/72ml Elix (Hyoscyamine sulfate) .Marland Kitchen.. 1 q6h as needed 4)  Loratadine Allergy Relief D-12 5-120 Mg Tb12 (Loratadine-pseudoephedrine) .... One twice daily 5)  Freestyle Test Strp (Glucose blood) .... Use two times a  day 6)  Benazepril-hydrochlorothiazide 20-12.5 Mg Tabs (Benazepril-hydrochlorothiazide) .Marland Kitchen.. 1 once daily 7)  Pravastatin Sodium 40 Mg Tabs (Pravastatin sodium) .... One daily 8)  Metformin Hcl 1000 Mg Tabs (Metformin hcl) .Marland Kitchen.. 1 two times a day 9)  Proair Hfa 108 (90 Base) Mcg/act Aers (Albuterol sulfate) .... 2 puffs q 4 hours for wheezing  Other Orders: Venipuncture (07371) TLB-A1C / Hgb A1C (Glycohemoglobin) (83036-A1C)  [place] Instructions: 1)  Please schedule a follow-up appointment in 3 months for CPX  2)  Advised not to eat any food or drink any liquids after 10 PM the night before your procedure. 3)  It is important that you exercise regularly at least 20 minutes 5 times a week. If you develop chest pain, have severe difficulty breathing, or feel very tired , stop exercising immediately and seek medical attention.  4)  You need to lose weight. Consider a lower calorie diet and regular exercise.  5)  Take calcium +Vitamin D daily. 6)  Check your blood sugars regularly. If your readings are usually above : or below 70 you should contact our office. 7)  It is important that your Diabetic A1c level is checked every 3 months. 8)  See your eye doctor yearly to check for diabetic eye damage.

## 2010-04-07 NOTE — Progress Notes (Signed)
Summary: refill metformin  Phone Note Refill Request Message from:  Fax from Pharmacy on December 15, 2009 9:22 AM  Refills Requested: Medication #1:  METFORMIN HCL 1000 MG TABS 1 two times a day   Last Refilled: 09/30/2009 [facility]   Method Requested: Fax to Local Pharmacy Initial call taken by: Duard Brady LPN,  December 15, 2009 9:23 AM    Prescriptions: METFORMIN HCL 1000 MG TABS (METFORMIN HCL) 1 two times a day  #180 x 3   Entered by:   Duard Brady LPN   Authorized by:   Gordy Savers  MD   Signed by:   Duard Brady LPN on 45/40/9811   Method used:   Faxed to ...       [facility] [address] (retail)       [phone]       [place], [place]  [address]       Ph: [phone]       Fax: [phone]   RxID:   [id number]  [facility]ed to [facility]   kik

## 2010-04-07 NOTE — Progress Notes (Signed)
Summary: referral  Phone Note Call from Patient Call back at Home Phone [phone]   Caller: Patient---live call Reason for Call: Referral Summary of Call: needs referral to have a screening colonoscopy. ok to leave a mesage.  is it ok for her to take fish oil vitamin and caltrate? Initial call taken by: Warnell Forester,  Jul 29, 2009 11:14 AM  Follow-up for Phone Call        al lOK Follow-up by: Gordy Savers  MD,  Jul 29, 2009 12:35 PM  Additional Follow-up for Phone Call Additional follow up Details #1::        lmoam to call me back. Additional Follow-up by: Warnell Forester,  Jul 29, 2009 12:43 PM    Additional Follow-up for Phone Call Additional follow up Details #2::    pt is aware that pcc will make appt for colonoscopy. she is aware of the other ? about vitamins. Follow-up by: Warnell Forester,  August 06, 2009 3:46 PM

## 2010-04-07 NOTE — Progress Notes (Signed)
Summary: refill pravastatin , glimipiride  Phone Note Call from Patient   Caller: Patient Call For: Gordy Savers  MD Reason for Call: Refill Medication Summary of Call: request  Refill on pravastatin and glimipiride to [facility] Initial call taken by: Duard Brady LPN,  September 11, 1608 1:49 PM    Prescriptions: AMARYL 4 MG TABS (GLIMEPIRIDE) 1 tablet two times a day  #180 x 4   Entered by:   Duard Brady LPN   Authorized by:   Gordy Savers  MD   Signed by:   Duard Brady LPN on 96/06/5407   Method used:   Electronically to        [facility] [address] (retail)       [phone]       [place], [place]  [address]       Ph: [phone]       Fax: [phone]   RxID:   [id number] PRAVASTATIN SODIUM 40 MG TABS (PRAVASTATIN SODIUM) one daily  #90 x 4   Entered by:   Duard Brady LPN   Authorized by:   Gordy Savers  MD   Signed by:   Duard Brady LPN on 64/40/3474   Method used:   Electronically to        [facility] [address] (retail)       [phone]       [place], [place]  [address]       Ph: [phone]       Fax: [phone]   RxID:   [id number]

## 2010-04-08 ENCOUNTER — Telehealth: Payer: Self-pay | Admitting: *Deleted

## 2010-04-08 NOTE — Telephone Encounter (Signed)
Needs test results 

## 2010-04-09 NOTE — Telephone Encounter (Signed)
Spoke with pt - informed that xray was normal - no PE. Doing ok , if sx do not improve - call. kik

## 2010-04-09 NOTE — Progress Notes (Signed)
Summary: change from pravastatin to generic lipitor  Phone Note Call from Patient   Caller: Patient Call For: Gordy Savers  MD Summary of Call: wanting to change from pravastatin to generic lipitor -   if ok'd by dr. Amador Cunas - need to print out all meds for refills to be sent to [facility] - want protonix rx sent to house.     Follow-up for Phone Call        i have mail order forms from pt at my desk >KIK Follow-up by: Duard Brady LPN,  February 25, 2010 1:00 PM    New/Updated Medications: LIPITOR 40 MG TABS (ATORVASTATIN CALCIUM) one daily Prescriptions: LIPITOR 40 MG TABS (ATORVASTATIN CALCIUM) one daily  #90 x 6   Entered and Authorized by:   Gordy Savers  MD   Signed by:   Gordy Savers  MD on 02/26/2010   Method used:   Print then Give to Patient   RxID:   [id number] PROAIR HFA 108 (90 BASE) MCG/ACT AERS (ALBUTEROL SULFATE) 2 puffs q 4 hours for wheezing  #1 x 0   Entered and Authorized by:   Gordy Savers  MD   Signed by:   Gordy Savers  MD on 02/26/2010   Method used:   Print then Give to Patient   RxID:   [id number] METFORMIN HCL 1000 MG TABS (METFORMIN HCL) 1 two times a day  #180 x 3   Entered and Authorized by:   Gordy Savers  MD   Signed by:   Gordy Savers  MD on 02/26/2010   Method used:   Print then Give to Patient   RxID:   [id number] BENAZEPRIL-HYDROCHLOROTHIAZIDE 20-12.5 MG TABS (BENAZEPRIL-HYDROCHLOROTHIAZIDE) 1 once daily  #90 x 6   Entered and Authorized by:   Gordy Savers  MD   Signed by:   Gordy Savers  MD on 02/26/2010   Method used:   Print then Give to Patient   RxID:   [id number] FREESTYLE TEST   STRP (GLUCOSE BLOOD) use two times a day  #180 x 6   Entered and Authorized by:   Gordy Savers  MD   Signed by:   Gordy Savers  MD on 02/26/2010   Method used:   Print then Give to Patient   RxID:   [id number] LEVSIN 0.125 MG TABS (HYOSCYAMINE  SULFATE) 1 every 6 hours prn  #50 x 4   Entered and Authorized by:   Gordy Savers  MD   Signed by:   Gordy Savers  MD on 02/26/2010   Method used:   Print then Give to Patient   RxID:   [id number] PROTONIX 40 MG TBEC (PANTOPRAZOLE SODIUM) Take 1 tablet by mouth twice a day  #90 x 6   Entered and Authorized by:   Gordy Savers  MD   Signed by:   Gordy Savers  MD on 02/26/2010   Method used:   Print then Give to Patient   RxID:   [id number] AMARYL 4 MG TABS (GLIMEPIRIDE) 1 tablet two times a day  #180 x 4   Entered and Authorized by:   Gordy Savers  MD   Signed by:   Gordy Savers  MD on 02/26/2010   Method used:   Print then Give to Patient   RxID:   [id number]

## 2010-04-09 NOTE — Telephone Encounter (Signed)
Please check on status and let her know that all results are normal

## 2010-04-09 NOTE — Progress Notes (Signed)
Summary: (L) flank pain  Phone Note Call from Patient   Caller: Patient Call For: Gordy Savers  MD Reason for Call: Talk to Nurse Summary of Call: still having (L) flank pain - it woke her up last night.  Discussed soft bland diet for 24 hrs. appt made for tomorrow if no improvement. KIK Initial call taken by: Duard Brady LPN,  April 01, 2010 12:16 PM

## 2010-04-09 NOTE — Assessment & Plan Note (Signed)
Summary: (L) flank pain     kik   Vital Signs:  Patient profile:   70 year old female Weight:      171 pounds Temp:     98.0 degrees F oral BP sitting:   120 / 80  (right arm) Cuff size:   regular  Vitals Entered By: Duard Brady LPN (April 02, 2010 11:29 AM) CC: c/o (R) flank pain  , lower shoulder pain     bs217 Is Patient Diabetic? Yes Did you bring your meter with you today? No   CC:  c/o (R) flank pain   and lower shoulder pain     bs217.  History of Present Illness: 70 year old patient who is seen today with a two-day history of pleuritic chest pain.  Late Monday night.  She noted the onset of sharp pain initially began in the right posterior chest wall area.  Assessment developed also on the left and in the interscapular area.  Pain is worse by cough, sneezing, and deep inspiration.  There has been no sputum production, shortness of breath, hemoptysis, or fever.  She denies any URI symptoms.  She does have a history of type 2 diabetes as well as treated hypertension.  No prior history of thromboembolic disease.  She has had left knee arthroscopic surgery in past. Her diabetes has been fairly stable, but has noted an elevated blood sugar as a more frequently in the morning.  Allergies: 1)  Penicillin G Potassium (Penicillin G Potassium)  Past History:  Past Medical History: Reviewed history from 06/25/2008 and no changes required. Diabetes mellitus, type II Hyperlipidemia Hypertension Diverticulosis, colon Osteoarthritis GERD  Past Surgical History: Reviewed history from 09/04/2007 and no changes required. Hysterectomy left salpingo-oophorectomy 1980 C-section x 3 Lumbar laminectomy 1989 arthroscopic left knee surgery 1999  colonoscopy 2002  Review of Systems       The patient complains of chest pain.  The patient denies anorexia, fever, weight loss, weight gain, vision loss, decreased hearing, hoarseness, syncope, dyspnea on exertion, peripheral edema,  prolonged cough, headaches, hemoptysis, abdominal pain, melena, hematochezia, severe indigestion/heartburn, hematuria, incontinence, genital sores, muscle weakness, suspicious skin lesions, transient blindness, difficulty walking, depression, unusual weight change, abnormal bleeding, enlarged lymph nodes, angioedema, and breast masses.    Physical Exam  General:  overweight-appearing.  no acute distress.  Blood pressure was normal Head:  Normocephalic and atraumatic without obvious abnormalities. No apparent alopecia or balding. Eyes:  No corneal or conjunctival inflammation noted. EOMI. Perrla. Funduscopic exam benign, without hemorrhages, exudates or papilledema. Vision grossly normal. Mouth:  Oral mucosa and oropharynx without lesions or exudates.  Teeth in good repair. Neck:  No deformities, masses, or tenderness noted. Lungs:  Normal respiratory effort, chest expands symmetrically. Lungs are clear to auscultation, no crackles or wheezes. O2 saturation 97% no rub Heart:  Normal rate and regular rhythm. S1 and S2 normal without gallop, murmur, click, rub or other extra sounds.  no tachycardia Abdomen:  Bowel sounds positive,abdomen soft and non-tender without masses, organomegaly or hernias noted. Extremities:  no swelling or calf tenderness negative Homans   Impression & Recommendations:  Problem # 1:  CHEST PAIN (ICD-786.50)  Orders: Venipuncture (09811) T-D-Dimer Fibrin Derivatives Quantitive ([id number]) Specimen Handling (13086) if d-dimer is positive will proceed with a chest CT to rule out pulmonary emboli  Problem # 2:  HYPERTENSION (ICD-401.9)  Her updated medication list for this problem includes:    Benazepril-hydrochlorothiazide 20-12.5 Mg Tabs (Benazepril-hydrochlorothiazide) .Marland Kitchen... 1 once daily  Problem # 3:  DIABETES MELLITUS, TYPE II (ICD-250.00)  Her updated medication list for this problem includes:    Amaryl 4 Mg Tabs (Glimepiride) .Marland Kitchen... 1 tablet two times  a day    Benazepril-hydrochlorothiazide 20-12.5 Mg Tabs (Benazepril-hydrochlorothiazide) .Marland Kitchen... 1 once daily    Metformin Hcl 1000 Mg Tabs (Metformin hcl) .Marland Kitchen... 1 two times a day  Complete Medication List: 1)  Amaryl 4 Mg Tabs (Glimepiride) .Marland Kitchen.. 1 tablet two times a day 2)  Protonix 40 Mg Tbec (Pantoprazole sodium) .... Take 1 tablet by mouth twice a day 3)  Levsin 0.125 Mg Tabs (Hyoscyamine sulfate) .Marland Kitchen.. 1 every 6 hours prn 4)  Loratadine Allergy Relief D-12 5-120 Mg Tb12 (Loratadine-pseudoephedrine) .... One twice daily 5)  Freestyle Test Strp (Glucose blood) .... Use two times a day 6)  Benazepril-hydrochlorothiazide 20-12.5 Mg Tabs (Benazepril-hydrochlorothiazide) .Marland Kitchen.. 1 once daily 7)  Metformin Hcl 1000 Mg Tabs (Metformin hcl) .Marland Kitchen.. 1 two times a day 8)  Proair Hfa 108 (90 Base) Mcg/act Aers (Albuterol sulfate) .... 2 puffs q 4 hours for wheezing 9)  Lipitor 40 Mg Tabs (Atorvastatin calcium) .... One daily  Patient Instructions: 1)  Take 400-600mg  of Ibuprofen (Advil, Motrin) with food every 4-6 hours as needed for relief of pain or comfort of fever. 2)  Please schedule a follow-up appointment in 2 months. 3)  Limit your Sodium (Salt) to less than 2 grams a day(slightly less than 1/2 a teaspoon) to prevent fluid retention, swelling, or worsening of symptoms.   Orders Added: 1)  Est. Patient Level IV [60454] 2)  Venipuncture [09811] 3)  T-D-Dimer Fibrin Derivatives Quantitive [[id number]] 4)  Specimen Handling [99000]

## 2010-04-09 NOTE — Assessment & Plan Note (Signed)
Summary: 3 MONTH ROV/NJR   Vital Signs:  Patient profile:   70 year old female Weight:      171 pounds Temp:     98.4 degrees F oral BP sitting:   122 / 80  (right arm) Cuff size:   regular  Vitals Entered By: Duard Brady LPN (March 27, 2010 10:48 AM) CC: 3 mos rov - doing well    fbs 189 Is Patient Diabetic? Yes Did you bring your meter with you today? No   CC:  3 mos rov - doing well    fbs 189.  History of Present Illness: 70 year old patient who is seen today for follow-up.  She has type 2 diabetes, hypertension, and dyslipidemia.  Three months ago.  She had a comprehensive evaluation.  This included a hemoglobin A1c of 7.8.  There is been some modest weight loss.  She is exercising more regularly.  She has developed some mild left lateral chest wall pain for the past few days.  Otherwise, no concerns or complaints.  She denies any exertional chest pain  Allergies: 1)  Penicillin G Potassium (Penicillin G Potassium)  Past History:  Past Medical History: Reviewed history from 06/25/2008 and no changes required. Diabetes mellitus, type II Hyperlipidemia Hypertension Diverticulosis, colon Osteoarthritis GERD  Past Surgical History: Reviewed history from 09/04/2007 and no changes required. Hysterectomy left salpingo-oophorectomy 1980 C-section x 3 Lumbar laminectomy 1989 arthroscopic left knee surgery 1999  colonoscopy 2002  Family History: Reviewed history from 09/23/2009 and no changes required. father died age 83, complications of cardiac and cerebrovascular disease mother, age 103 Brother died of complications of DM  Social History: Reviewed history from 02/27/2008 and no changes required. Married Never Smoked  Review of Systems       The patient complains of chest pain.  The patient denies anorexia, fever, weight loss, weight gain, vision loss, decreased hearing, hoarseness, syncope, dyspnea on exertion, peripheral edema, prolonged cough,  headaches, hemoptysis, abdominal pain, melena, hematochezia, severe indigestion/heartburn, hematuria, incontinence, genital sores, muscle weakness, suspicious skin lesions, transient blindness, difficulty walking, depression, unusual weight change, abnormal bleeding, enlarged lymph nodes, angioedema, and breast masses.    Physical Exam  General:  overweight-appearing.  120/82overweight-appearing.   Head:  Normocephalic and atraumatic without obvious abnormalities. No apparent alopecia or balding. Eyes:  No corneal or conjunctival inflammation noted. EOMI. Perrla. Funduscopic exam benign, without hemorrhages, exudates or papilledema. Vision grossly normal. Mouth:  Oral mucosa and oropharynx without lesions or exudates.  Teeth in good repair. Neck:  No deformities, masses, or tenderness noted. Chest Wall:  No deformities, masses, or tenderness noted.  Lungs:  Normal respiratory effort, chest expands symmetrically. Lungs are clear to auscultation, no crackles or wheezes. Heart:  Normal rate and regular rhythm. S1 and S2 normal without gallop, murmur, click, rub or other extra sounds. Abdomen:  Bowel sounds positive,abdomen soft and non-tender without masses, organomegaly or hernias noted. Msk:  No deformity or scoliosis noted of thoracic or lumbar spine.   Extremities:  No clubbing, cyanosis, edema, or deformity noted with normal full range of motion of all joints.     Impression & Recommendations:  Problem # 1:  HYPERLIPIDEMIA (ICD-272.4)  Her updated medication list for this problem includes:    Lipitor 40 Mg Tabs (Atorvastatin calcium) ..... One daily  Her updated medication list for this problem includes:    Lipitor 40 Mg Tabs (Atorvastatin calcium) ..... One daily  Problem # 2:  HYPERTENSION (ICD-401.9)  Her updated medication list  for this problem includes:    Benazepril-hydrochlorothiazide 20-12.5 Mg Tabs (Benazepril-hydrochlorothiazide) .Marland Kitchen... 1 once daily  Her updated medication  list for this problem includes:    Benazepril-hydrochlorothiazide 20-12.5 Mg Tabs (Benazepril-hydrochlorothiazide) .Marland Kitchen... 1 once daily  Problem # 3:  DIABETES MELLITUS, TYPE II (ICD-250.00)  Her updated medication list for this problem includes:    Amaryl 4 Mg Tabs (Glimepiride) .Marland Kitchen... 1 tablet two times a day    Benazepril-hydrochlorothiazide 20-12.5 Mg Tabs (Benazepril-hydrochlorothiazide) .Marland Kitchen... 1 once daily    Metformin Hcl 1000 Mg Tabs (Metformin hcl) .Marland Kitchen... 1 two times a day    Her updated medication list for this problem includes:    Amaryl 4 Mg Tabs (Glimepiride) .Marland Kitchen... 1 tablet two times a day    Benazepril-hydrochlorothiazide 20-12.5 Mg Tabs (Benazepril-hydrochlorothiazide) .Marland Kitchen... 1 once daily    Metformin Hcl 1000 Mg Tabs (Metformin hcl) .Marland Kitchen... 1 two times a day  Orders: Venipuncture (16109) TLB-A1C / Hgb A1C (Glycohemoglobin) (83036-A1C) Specimen Handling (60454)  Complete Medication List: 1)  Amaryl 4 Mg Tabs (Glimepiride) .Marland Kitchen.. 1 tablet two times a day 2)  Protonix 40 Mg Tbec (Pantoprazole sodium) .... Take 1 tablet by mouth twice a day 3)  Levsin 0.125 Mg Tabs (Hyoscyamine sulfate) .Marland Kitchen.. 1 every 6 hours prn 4)  Loratadine Allergy Relief D-12 5-120 Mg Tb12 (Loratadine-pseudoephedrine) .... One twice daily 5)  Freestyle Test Strp (Glucose blood) .... Use two times a day 6)  Benazepril-hydrochlorothiazide 20-12.5 Mg Tabs (Benazepril-hydrochlorothiazide) .Marland Kitchen.. 1 once daily 7)  Metformin Hcl 1000 Mg Tabs (Metformin hcl) .Marland Kitchen.. 1 two times a day 8)  Proair Hfa 108 (90 Base) Mcg/act Aers (Albuterol sulfate) .... 2 puffs q 4 hours for wheezing 9)  Lipitor 40 Mg Tabs (Atorvastatin calcium) .... One daily  Patient Instructions: 1)  Please schedule a follow-up appointment in 3 months. 2)  Limit your Sodium (Salt). 3)  It is important that you exercise regularly at least 20 minutes 5 times a week. If you develop chest pain, have severe difficulty breathing, or feel very tired , stop  exercising immediately and seek medical attention. 4)  You need to lose weight. Consider a lower calorie diet and regular exercise.  5)  Check your blood sugars regularly. If your readings are usually above : or below 70 you should contact our office. 6)  It is important that your Diabetic A1c level is checked every 3 months. 7)  See your eye doctor yearly to check for diabetic eye damage.   Orders Added: 1)  Est. Patient Level III [09811] 2)  Venipuncture [36415] 3)  TLB-A1C / Hgb A1C (Glycohemoglobin) [83036-A1C] 4)  Specimen Handling [99000]

## 2010-06-18 ENCOUNTER — Encounter: Payer: Self-pay | Admitting: Internal Medicine

## 2010-06-19 ENCOUNTER — Encounter: Payer: Self-pay | Admitting: Internal Medicine

## 2010-06-19 ENCOUNTER — Ambulatory Visit (INDEPENDENT_AMBULATORY_CARE_PROVIDER_SITE_OTHER): Payer: Medicare Other | Admitting: Internal Medicine

## 2010-06-19 DIAGNOSIS — K219 Gastro-esophageal reflux disease without esophagitis: Secondary | ICD-10-CM

## 2010-06-19 DIAGNOSIS — E785 Hyperlipidemia, unspecified: Secondary | ICD-10-CM

## 2010-06-19 DIAGNOSIS — E119 Type 2 diabetes mellitus without complications: Secondary | ICD-10-CM

## 2010-06-19 NOTE — Progress Notes (Signed)
Subjective:    Patient ID: Briana Patton, female    DOB: 09/22/1940, 70 y.o.   MRN: [id number]  HPI  70 year old patient who is seen today for followup of her type 2 diabetes. She has dyslipidemia and a history of hypertension. She denies any cardiopulmonary complaints. She does have some allergy-related issues and some mild breakthrough reflux symptoms. Her last hemoglobin A1c was 8 months ago and was 8.0. A fasting blood sugar 191. She remains on Amaryl and metformin. She continues to tolerate both medications without difficulty.    Review of Systems  Constitutional: Negative.   HENT: Positive for congestion, rhinorrhea and postnasal drip. Negative for hearing loss, sore throat, dental problem, sinus pressure and tinnitus.   Eyes: Negative for pain, discharge and visual disturbance.  Respiratory: Negative for cough and shortness of breath.   Cardiovascular: Negative for chest pain, palpitations and leg swelling.  Gastrointestinal: Negative for nausea, vomiting, abdominal pain, diarrhea, constipation, blood in stool and abdominal distention.  Genitourinary: Negative for dysuria, urgency, frequency, hematuria, flank pain, vaginal bleeding, vaginal discharge, difficulty urinating, vaginal pain and pelvic pain.  Musculoskeletal: Negative for joint swelling, arthralgias and gait problem.  Skin: Negative for rash.  Neurological: Negative for dizziness, syncope, speech difficulty, weakness, numbness and headaches.  Hematological: Negative for adenopathy.  Psychiatric/Behavioral: Negative for behavioral problems, dysphoric mood and agitation. The patient is not nervous/anxious.        Objective:   Physical Exam  Constitutional: She is oriented to person, place, and time. She appears well-developed and well-nourished.       122/80  HENT:  Head: Normocephalic.  Right Ear: External ear normal.  Left Ear: External ear normal.  Mouth/Throat: Oropharynx is clear and moist.  Eyes: Conjunctivae  and EOM are normal. Pupils are equal, round, and reactive to light.  Neck: Normal range of motion. Neck supple. No thyromegaly present.  Cardiovascular: Normal rate, regular rhythm, normal heart sounds and intact distal pulses.   Pulmonary/Chest: Effort normal and breath sounds normal.  Abdominal: Soft. Bowel sounds are normal. She exhibits no mass. There is no tenderness.  Musculoskeletal: Normal range of motion.  Lymphadenopathy:    She has no cervical adenopathy.  Neurological: She is alert and oriented to person, place, and time.  Skin: Skin is warm and dry. No rash noted.  Psychiatric: She has a normal mood and affect. Her behavior is normal.          Assessment & Plan:  Diabetes mellitus. Unclear control. Lifestyle issues addressed. We'll check a hemoglobin A1c Blood pressure well controlled we'll continue present regimen Gastroesophageal reflux disease fairly stable. Antireflux measures discussed Dyslipidemia. Remains on Lipitor 40 we'll continue present regimen

## 2010-06-19 NOTE — Patient Instructions (Signed)
Limit your sodium (Salt) intake  You need to lose weight.  Consider a lower calorie diet and regular exercise.    It is important that you exercise regularly, at least 20 minutes 3 to 4 times per week.  If you develop chest pain or shortness of breath seek  medical attention.   Please check your hemoglobin A1c every 3 months   

## 2010-06-19 NOTE — Progress Notes (Signed)
Quick Note:  Spoke with pt informed of lab results and Dr. Vergia Alberts instructions. Will come by office next week to pick up meal planning infor. KIK ______

## 2010-07-23 LAB — HM MAMMOGRAPHY

## 2010-07-24 ENCOUNTER — Encounter: Payer: Self-pay | Admitting: Internal Medicine

## 2010-08-04 ENCOUNTER — Encounter: Payer: Self-pay | Admitting: Internal Medicine

## 2010-08-04 ENCOUNTER — Ambulatory Visit (INDEPENDENT_AMBULATORY_CARE_PROVIDER_SITE_OTHER): Payer: Medicare Other | Admitting: Internal Medicine

## 2010-08-04 DIAGNOSIS — E119 Type 2 diabetes mellitus without complications: Secondary | ICD-10-CM

## 2010-08-04 DIAGNOSIS — L0292 Furuncle, unspecified: Secondary | ICD-10-CM

## 2010-08-04 LAB — GLUCOSE, POCT (MANUAL RESULT ENTRY): POC Glucose: 273

## 2010-08-04 MED ORDER — CIPROFLOXACIN HCL 500 MG PO TABS: 500.0000 mg | ORAL_TABLET | Freq: Two times a day (BID) | ORAL | Status: AC

## 2010-08-04 NOTE — Progress Notes (Signed)
Subjective:    Patient ID: Briana Patton, female    DOB: 02-16-1941, 70 y.o.   MRN: [id number]  HPI  70 year old patient who has developed a painful nodule involving the sacral area. She does have a history of type 2 diabetes and recent hemoglobin A1c 8.0. She has treated hypertension and dyslipidemia    Review of Systems  Skin: Positive for rash.       Objective:   Physical Exam  Constitutional: She appears well-developed and well-nourished. No distress.  Skin:       Patient had an approximate one and a half centimeter erythematous tender nodule in the sacral area high in the intergluteal cleft. No obvious fluctuance          Assessment & Plan:   Furuncle high intergluteal cleft. We'll treat with sitz baths and antibiotic therapy. She is aware that she may require I&D if symptoms do not improve.

## 2010-08-04 NOTE — Patient Instructions (Signed)
And take warm sitz baths 3 times daily Take your antibiotic as prescribed until ALL of it is gone, but stop if you develop a rash, swelling, or any side effects of the medication.  Contact our office as soon as possible if  there are side effects of the medication.   Call or return to clinic prn if these symptoms worsen or fail to improve as anticipated.

## 2010-09-18 ENCOUNTER — Encounter: Payer: Medicare Other | Admitting: Internal Medicine

## 2011-02-12 ENCOUNTER — Other Ambulatory Visit: Payer: Self-pay | Admitting: Internal Medicine

## 2011-02-12 MED ORDER — BENAZEPRIL-HYDROCHLOROTHIAZIDE 20-12.5 MG PO TABS: 1.0000 | ORAL_TABLET | Freq: Every day | ORAL | Status: DC

## 2011-02-12 NOTE — Telephone Encounter (Signed)
Pt called req refill of benazepril-hydrochlorthiazide (LOTENSIN HCT) 20-12.5 MG per tablet to [facility] on [address]. Pt completely out of med. Pls call in asap today.

## 2011-02-12 NOTE — Telephone Encounter (Signed)
done

## 2011-03-11 ENCOUNTER — Other Ambulatory Visit: Payer: Self-pay | Admitting: Internal Medicine

## 2011-03-16 ENCOUNTER — Encounter: Payer: Self-pay | Admitting: Internal Medicine

## 2011-03-16 ENCOUNTER — Ambulatory Visit (INDEPENDENT_AMBULATORY_CARE_PROVIDER_SITE_OTHER): Payer: Medicare Other | Admitting: Internal Medicine

## 2011-03-16 DIAGNOSIS — Z23 Encounter for immunization: Secondary | ICD-10-CM

## 2011-03-16 DIAGNOSIS — I10 Essential (primary) hypertension: Secondary | ICD-10-CM

## 2011-03-16 DIAGNOSIS — Z0000 Encounter for general adult medical examination without abnormal findings: Secondary | ICD-10-CM

## 2011-03-16 DIAGNOSIS — E785 Hyperlipidemia, unspecified: Secondary | ICD-10-CM

## 2011-03-16 DIAGNOSIS — E119 Type 2 diabetes mellitus without complications: Secondary | ICD-10-CM

## 2011-03-16 DIAGNOSIS — M1990 Unspecified osteoarthritis, unspecified site: Secondary | ICD-10-CM

## 2011-03-16 DIAGNOSIS — R0602 Shortness of breath: Secondary | ICD-10-CM

## 2011-03-16 LAB — GLUCOSE, POCT (MANUAL RESULT ENTRY): POC Glucose: 186

## 2011-03-16 LAB — LIPID PANEL
Cholesterol: 170 mg/dL (ref 0–200)
HDL: 62.8 mg/dL (ref >39.00)
LDL Cholesterol: 82 mg/dL (ref 0–99)
Total CHOL/HDL Ratio: 3
Triglycerides: 127 mg/dL (ref 0.0–149.0)
VLDL: 25.4 mg/dL (ref 0.0–40.0)

## 2011-03-16 LAB — COMPREHENSIVE METABOLIC PANEL
ALT: 29 U/L (ref 0–35)
BUN: 14 mg/dL (ref 6–23)
CO2: 28 mEq/L (ref 19–32)
Calcium: 9.1 mg/dL (ref 8.4–10.5)
Chloride: 102 mEq/L (ref 96–112)
Creatinine, Ser: 0.8 mg/dL (ref 0.4–1.2)
GFR: 77.77 mL/min (ref >60.00)
Glucose, Bld: 164 mg/dL — ABNORMAL HIGH (ref 70–99)

## 2011-03-16 LAB — CBC WITH DIFFERENTIAL/PLATELET
Basophils Absolute: 0.1 10*3/uL (ref 0.0–0.1)
Eosinophils Relative: 1.9 % (ref 0.0–5.0)
Hemoglobin: 12.8 g/dL (ref 12.0–15.0)
Lymphocytes Relative: 37.1 % (ref 12.0–46.0)
Monocytes Relative: 5.4 % (ref 3.0–12.0)
Neutro Abs: 5.1 10*3/uL (ref 1.4–7.7)
RBC: 4.23 Mil/uL (ref 3.87–5.11)
RDW: 13.4 % (ref 11.5–14.6)
WBC: 9.4 10*3/uL (ref 4.5–10.5)

## 2011-03-16 MED ORDER — GLUCOSE BLOOD VI STRP: 1.0000 | ORAL_STRIP | Freq: Every day | Status: DC

## 2011-03-16 MED ORDER — GLUCOSE BLOOD VI STRP: ORAL_STRIP | Status: DC

## 2011-03-16 MED ORDER — GLIMEPIRIDE 4 MG PO TABS: 4.0000 mg | ORAL_TABLET | Freq: Every day | ORAL | Status: DC

## 2011-03-16 MED ORDER — METFORMIN HCL 1000 MG PO TABS: 1000.0000 mg | ORAL_TABLET | Freq: Two times a day (BID) | ORAL | Status: DC

## 2011-03-16 MED ORDER — PANTOPRAZOLE SODIUM 40 MG PO TBEC: 40.0000 mg | DELAYED_RELEASE_TABLET | Freq: Two times a day (BID) | ORAL | Status: DC

## 2011-03-16 MED ORDER — ATORVASTATIN CALCIUM 40 MG PO TABS: 40.0000 mg | ORAL_TABLET | Freq: Every day | ORAL | Status: DC

## 2011-03-16 MED ORDER — BENAZEPRIL-HYDROCHLOROTHIAZIDE 20-12.5 MG PO TABS: 1.0000 | ORAL_TABLET | Freq: Every day | ORAL | Status: DC

## 2011-03-16 NOTE — Patient Instructions (Signed)
Please check your hemoglobin A1c every 3 months    It is important that you exercise regularly, at least 20 minutes 3 to 4 times per week.  If you develop chest pain or shortness of breath seek  medical attention.  You need to lose weight.  Consider a lower calorie diet and regular exercise. 

## 2011-03-16 NOTE — Progress Notes (Signed)
Subjective:    Patient ID: Briana Patton, female    DOB: [birth year], 71 y.o.   MRN: [id number]  HPI  History of Present Illness:   5 -year-old patient seen today for a wellness exam. Medical problems include type 2 diabetes, hypertension, and dyslipidemia. Her last hemoglobin A1c was 8.0. There is been some very modest weight loss. She has osteoarthritis, gastroesophageal reflux disease. She spends considerable time on the [place] it to her mother's poor health and has not been seen here recently. She missed her 10 year colonoscopy last year  Here for Medicare AWV:   1. Risk factors based on Past M, S, F history:cardiovascular risk factors include diabetes, hypertension, dyslipidemia  2. Physical Activities: no activity restrictions, but no regular exercise program. Walks occasionally  3. Depression/mood: no history depression, or mood disorder  4. Hearing: no hearing deficits  5. ADL's: independent in all aspects of the daily living  6. Fall Risk: low  7. Home Safety: Toprol is identified  8. Height, weight, &visual acuity:height and weight stable. No difficulty with visual acuity  9. Counseling: follow-up eye examination and encouraged  10. Labs ordered based on risk factors: laboratory profile, including lipid panel, and hemoglobin A1c will be reviewed  11. Referral Coordination- ophthalmology referral  12. Care Plan- heart healthy diet, exercise and weight loss. All encouraged  13. Cognitive Assessment- alert and oriented normal affect. No difficulty with short-term memory. Able to handle all executive functioning without difficulty   Preventive Screening-Counseling & Management  Alcohol-Tobacco  Smoking Status: quit   Current Problems (verified):   1) Vertigo, Peripheral (ICD-386.10)  2) Gerd (ICD-530.81)  3) Palpitations, Occasional (ICD-785.1)  4) Osteoarthritis (ICD-715.90)  5) Uri (ICD-465.9)  6) Diverticulosis, Colon (ICD-562.10)  7) Arthroscopy, Left Knee, Hx of  (ICD-V45.89)  8) Hypertension (ICD-401.9)  9) Hyperlipidemia (ICD-272.4)  10) Diabetes Mellitus, Type II (ICD-250.00)   Current Medications (verified):  1) Amaryl 4 Mg Tabs (Glimepiride) .Marland Kitchen.. 1 Tablet Two Times A Day  2) Protonix 40 Mg Tbec (Pantoprazole Sodium) .... Take 1 Tablet By Mouth Twice A Day  3) Levsin 0.125 Mg/61ml Elix (Hyoscyamine Sulfate) .Marland Kitchen.. 1 Q6h As Needed  4) Loratadine Allergy Relief D-12 5-120 Mg Tb12 (Loratadine-Pseudoephedrine) .... One Twice Daily  5) Freestyle Test Strp (Glucose Blood) .... Use Two Times A Day  6) Benazepril-Hydrochlorothiazide 20-12.5 Mg Tabs (Benazepril-Hydrochlorothiazide) .Marland Kitchen.. 1 Once Daily  7) Pravastatin Sodium 40 Mg Tabs (Pravastatin Sodium) .... One Daily  8) Metformin Hcl 1000 Mg Tabs (Metformin Hcl) .Marland Kitchen.. 1 Two Times A Day  9) Proair Hfa 108 (90 Base) Mcg/act Aers (Albuterol Sulfate) .... 2 Puffs Q 4 Hours For Wheezing   Allergies (verified):  1) Penicillin G Potassium (Penicillin G Potassium)  Past History:  Past Medical History:  Reviewed history from 06/25/2008 and no changes required.  Diabetes mellitus, type II  Hyperlipidemia  Hypertension  Diverticulosis, colon  Osteoarthritis  GERD   Past Surgical History:  Reviewed history from 09/04/2007 and no changes required.   Hysterectomy left salpingo-oophorectomy 1980  C-section x 3  Lumbar laminectomy 1989  arthroscopic left knee surgery 1999  colonoscopy 2002   Family History:  Reviewed history from 09/23/2009 and no changes required.   father died age 54, complications of cardiac and cerebrovascular disease  mother, age 11  Brother died of complications of DM  Social History:  Reviewed history from 02/27/2008 and no changes required.  Married  Never Smoked   Review of Systems  Constitutional: Negative for fever, appetite  change, fatigue and unexpected weight change.  HENT: Negative for hearing loss, ear pain, nosebleeds, congestion, sore throat, mouth sores, trouble  swallowing, neck stiffness, dental problem, voice change, sinus pressure and tinnitus.   Eyes: Negative for photophobia, pain, redness and visual disturbance.  Respiratory: Negative for cough, chest tightness and shortness of breath.   Cardiovascular: Negative for chest pain, palpitations and leg swelling.  Gastrointestinal: Negative for nausea, vomiting, abdominal pain, diarrhea, constipation, blood in stool, abdominal distention and rectal pain.  Genitourinary: Negative for dysuria, urgency, frequency, hematuria, flank pain, vaginal bleeding, vaginal discharge, difficulty urinating, genital sores, vaginal pain, menstrual problem and pelvic pain.  Musculoskeletal: Negative for back pain and arthralgias.  Skin: Negative for rash.  Neurological: Negative for dizziness, syncope, speech difficulty, weakness, light-headedness, numbness and headaches.  Hematological: Negative for adenopathy. Does not bruise/bleed easily.  Psychiatric/Behavioral: Negative for suicidal ideas, behavioral problems, self-injury, dysphoric mood and agitation. The patient is not nervous/anxious.        Objective:   Physical Exam  Constitutional: She is oriented to person, place, and time. She appears well-developed and well-nourished.  HENT:  Head: Normocephalic and atraumatic.  Right Ear: External ear normal.  Left Ear: External ear normal.  Mouth/Throat: Oropharynx is clear and moist.  Eyes: Conjunctivae and EOM are normal.  Neck: Normal range of motion. Neck supple. No JVD present. No thyromegaly present.  Cardiovascular: Normal rate, regular rhythm, normal heart sounds and intact distal pulses.   No murmur heard. Pulmonary/Chest: Effort normal and breath sounds normal. She has no wheezes. She has no rales.  Abdominal: Soft. Bowel sounds are normal. She exhibits no distension and no mass. There is no tenderness. There is no rebound and no guarding.       Lower midline scar  Musculoskeletal: Normal range of  motion. She exhibits no edema and no tenderness.  Neurological: She is alert and oriented to person, place, and time. She has normal reflexes. No cranial nerve deficit. She exhibits normal muscle tone. Coordination normal.  Skin: Skin is warm and dry. No rash noted.  Psychiatric: She has a normal mood and affect. Her behavior is normal.          Assessment & Plan:   Preventive health exam. Diabetes mellitus. The patient is on maximal dual therapy. We'll check a hemoglobin A1c. May need additional treatment Hypertension well controlled Dyslipidemia lipid profile will be reviewed  Recheck 3 months All meds refilled We'll schedule a followup colonoscopy

## 2011-03-17 ENCOUNTER — Other Ambulatory Visit: Payer: Self-pay | Admitting: Internal Medicine

## 2011-03-17 MED ORDER — PIOGLITAZONE HCL 15 MG PO TABS: 15.0000 mg | ORAL_TABLET | Freq: Every day | ORAL | Status: DC

## 2011-03-17 NOTE — Progress Notes (Signed)
Quick Note:  Spoke with pt - informed of lab and new med to be sent to [facility] ______

## 2011-05-19 ENCOUNTER — Encounter: Payer: Self-pay | Admitting: Gastroenterology

## 2011-05-31 ENCOUNTER — Encounter: Payer: Self-pay | Admitting: *Deleted

## 2011-06-03 ENCOUNTER — Encounter: Payer: Self-pay | Admitting: Gastroenterology

## 2011-06-03 ENCOUNTER — Ambulatory Visit (INDEPENDENT_AMBULATORY_CARE_PROVIDER_SITE_OTHER): Payer: Medicare Other | Admitting: Gastroenterology

## 2011-06-03 VITALS — BP 140/80 | HR 68 | Ht 59.0 in | Wt 176.0 lb

## 2011-06-03 DIAGNOSIS — K589 Irritable bowel syndrome without diarrhea: Secondary | ICD-10-CM

## 2011-06-03 DIAGNOSIS — K219 Gastro-esophageal reflux disease without esophagitis: Secondary | ICD-10-CM | POA: Insufficient documentation

## 2011-06-03 DIAGNOSIS — E119 Type 2 diabetes mellitus without complications: Secondary | ICD-10-CM

## 2011-06-03 DIAGNOSIS — K2270 Barrett's esophagus without dysplasia: Secondary | ICD-10-CM | POA: Insufficient documentation

## 2011-06-03 HISTORY — DX: Irritable bowel syndrome, unspecified: K58.9

## 2011-06-03 HISTORY — DX: Barrett's esophagus without dysplasia: K22.70

## 2011-06-03 MED ORDER — DEXLANSOPRAZOLE 60 MG PO CPDR: 60.0000 mg | DELAYED_RELEASE_CAPSULE | Freq: Every day | ORAL | Status: DC

## 2011-06-03 NOTE — Patient Instructions (Addendum)
Per your request you can call back to schedule your Endoscopy for follow up of your Barretts and gerd and your Colonoscopy for your 10 year screening. You will also need a pre visit to get your instructions and sign your consents.  Stop your Protonix and start the Dexilant once a day, samples and a rx has been sent to your pharmacy.

## 2011-06-03 NOTE — Progress Notes (Signed)
History of Present Illness:  This is a 71 year old female patient is due for followup 10 year colonoscopy screening. She denies lower gastrointestinal symptoms, melena, or hematochezia. 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 She also has chronic acid reflux and takes Protonix as needed. 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 There is no history of dysphagia, hepatobiliary complaints, or systemic complaints. Her appetite is good her weight is stable. She does have adult onset diabetes, but is not on insulin therapy. Family history is noncontributory. She is due for repeat lab examination with primary care in several weeks.  I have reviewed this patient's present history, medical and surgical past history, allergies and medications.     ROS: The remainder of the 10 point ROS is negative     Physical Exam: Blood pressure 140/80, pulse 60 and regular, and BMI of 35.55. General well developed well nourished patient in no acute distress, appearing her stated age Eyes PERRLA, no icterus, fundoscopic exam per opthamologist Skin no lesions noted Neck supple, no adenopathy, no thyroid enlargement, no tenderness Chest clear to percussion and auscultation Heart no significant murmurs, gallops or rubs noted Abdomen no hepatosplenomegaly masses or tenderness, BS normal.  Extremities no acute joint lesions, edema, phlebitis or evidence of cellulitis. Neurologic patient oriented x 3, cranial nerves intact, no focal neurologic deficits noted. Psychological mental status normal and normal affect.  Assessment and plan: Chronic GERD with previous Barrett's mucosa. She definitely needs followup endoscopy. I also have reviewed a reflux regime with her and I prescribed Dexilant 60 mg every morning. We also have scheduled followup colonoscopy exam with a balanced electrolyte solution preparation, and  changes in her diabetic medications appropriate for these procedures.  No diagnosis found.

## 2011-06-07 ENCOUNTER — Encounter: Payer: Self-pay | Admitting: Gastroenterology

## 2011-06-15 ENCOUNTER — Encounter: Payer: Self-pay | Admitting: Internal Medicine

## 2011-06-15 ENCOUNTER — Ambulatory Visit (INDEPENDENT_AMBULATORY_CARE_PROVIDER_SITE_OTHER): Payer: Medicare Other | Admitting: Internal Medicine

## 2011-06-15 VITALS — BP 118/70 | Temp 98.0°F | Wt 179.0 lb

## 2011-06-15 DIAGNOSIS — I10 Essential (primary) hypertension: Secondary | ICD-10-CM

## 2011-06-15 DIAGNOSIS — E119 Type 2 diabetes mellitus without complications: Secondary | ICD-10-CM

## 2011-06-15 DIAGNOSIS — E785 Hyperlipidemia, unspecified: Secondary | ICD-10-CM

## 2011-06-15 MED ORDER — PIOGLITAZONE HCL 45 MG PO TABS: 45.0000 mg | ORAL_TABLET | Freq: Every day | ORAL | Status: DC

## 2011-06-15 NOTE — Progress Notes (Signed)
Subjective:    Patient ID: Briana Patton, female    DOB: 1940/06/11, 71 y.o.   MRN: [id number]  HPI  71 year old patient who is seen today for her poorly followup. She has type 2 diabetes and Actos was added to her regimen 3 months ago.  She has not been adhering to diet and weight is up 8 pounds. She has dyslipidemia and remains on atorvastatin 40 mg daily. She has treated hypertension which has been stable. No new concerns or complaints today. She does have a history of osteoarthritis that has been stable. Laboratory profile from January reviewed. Cholesterol is at goal. Hemoglobin A1c at that time 8.0   Review of Systems  Constitutional: Positive for activity change and unexpected weight change.  HENT: Negative for hearing loss, congestion, sore throat, rhinorrhea, dental problem, sinus pressure and tinnitus.   Eyes: Negative for pain, discharge and visual disturbance.  Respiratory: Negative for cough and shortness of breath.   Cardiovascular: Negative for chest pain, palpitations and leg swelling.  Gastrointestinal: Negative for nausea, vomiting, abdominal pain, diarrhea, constipation, blood in stool and abdominal distention.  Genitourinary: Negative for dysuria, urgency, frequency, hematuria, flank pain, vaginal bleeding, vaginal discharge, difficulty urinating, vaginal pain and pelvic pain.  Musculoskeletal: Negative for joint swelling, arthralgias and gait problem.  Skin: Negative for rash.  Neurological: Negative for dizziness, syncope, speech difficulty, weakness, numbness and headaches.  Hematological: Negative for adenopathy.  Psychiatric/Behavioral: Negative for behavioral problems, dysphoric mood and agitation. The patient is not nervous/anxious.        Objective:   Physical Exam  Constitutional: She is oriented to person, place, and time. She appears well-developed and well-nourished.       Obese Weight 179 Fasting blood sugar 165 Blood pressure 120/70  HENT:  Head:  Normocephalic.  Right Ear: External ear normal.  Left Ear: External ear normal.  Mouth/Throat: Oropharynx is clear and moist.  Eyes: Conjunctivae and EOM are normal. Pupils are equal, round, and reactive to light.  Neck: Normal range of motion. Neck supple. No thyromegaly present.  Cardiovascular: Normal rate, regular rhythm, normal heart sounds and intact distal pulses.   Pulmonary/Chest: Effort normal and breath sounds normal.  Abdominal: Soft. Bowel sounds are normal. She exhibits no mass. There is no tenderness.  Musculoskeletal: Normal range of motion.  Lymphadenopathy:    She has no cervical adenopathy.  Neurological: She is alert and oriented to person, place, and time.  Skin: Skin is warm and dry. No rash noted.  Psychiatric: She has a normal mood and affect. Her behavior is normal.          Assessment & Plan:   Diabetes mellitus. We'll increase Actos to 45 mg daily weight loss exercise better diet all encouraged we'll recheck a hemoglobin A1c in 3 months Hypertension well controlled Dyslipidemia stable we'll continue atorvastatin 40 mg daily Exogenous obesity diet weight loss exercise encouraged

## 2011-06-15 NOTE — Patient Instructions (Signed)
Please check your hemoglobin A1c every 3 months    It is important that you exercise regularly, at least 20 minutes 3 to 4 times per week.  If you develop chest pain or shortness of breath seek  medical attention.  You need to lose weight.  Consider a lower calorie diet and regular exercise. 

## 2011-06-15 NOTE — Progress Notes (Signed)
Subjective:    Patient ID: Briana Patton, female    DOB: 22-Nov-1940, 71 y.o.   MRN: [id number]  HPI  Wt Readings from Last 3 Encounters:  06/15/11 179 lb (81.194 kg)  06/03/11 176 lb (79.833 kg)  03/16/11 171 lb (77.565 kg)    Review of Systems     Objective:   Physical Exam        Assessment & Plan:

## 2011-06-16 ENCOUNTER — Ambulatory Visit (AMBULATORY_SURGERY_CENTER): Payer: Medicare Other | Admitting: *Deleted

## 2011-06-16 VITALS — Ht 59.0 in | Wt 175.5 lb

## 2011-06-16 DIAGNOSIS — K2270 Barrett's esophagus without dysplasia: Secondary | ICD-10-CM

## 2011-06-16 DIAGNOSIS — Z1211 Encounter for screening for malignant neoplasm of colon: Secondary | ICD-10-CM

## 2011-06-23 ENCOUNTER — Encounter: Payer: Self-pay | Admitting: Gastroenterology

## 2011-06-23 MED ORDER — MOVIPREP 100 G PO SOLR: ORAL | Status: DC

## 2011-06-23 NOTE — Progress Notes (Signed)
Addended by: Virgel Paling on: 06/23/2011 11:00 AM   Modules accepted: Orders

## 2011-06-29 ENCOUNTER — Encounter: Payer: Self-pay | Admitting: Gastroenterology

## 2011-06-29 ENCOUNTER — Ambulatory Visit (AMBULATORY_SURGERY_CENTER): Payer: Medicare Other | Admitting: Gastroenterology

## 2011-06-29 VITALS — BP 147/72 | HR 60 | Temp 98.2°F | Resp 19 | Ht 59.0 in | Wt 175.0 lb

## 2011-06-29 DIAGNOSIS — D126 Benign neoplasm of colon, unspecified: Secondary | ICD-10-CM

## 2011-06-29 DIAGNOSIS — Z1211 Encounter for screening for malignant neoplasm of colon: Secondary | ICD-10-CM

## 2011-06-29 DIAGNOSIS — K5730 Diverticulosis of large intestine without perforation or abscess without bleeding: Secondary | ICD-10-CM

## 2011-06-29 DIAGNOSIS — K219 Gastro-esophageal reflux disease without esophagitis: Secondary | ICD-10-CM

## 2011-06-29 DIAGNOSIS — K2270 Barrett's esophagus without dysplasia: Secondary | ICD-10-CM

## 2011-06-29 DIAGNOSIS — R079 Chest pain, unspecified: Secondary | ICD-10-CM

## 2011-06-29 MED ORDER — SODIUM CHLORIDE 0.9 % IV SOLN: 500.0000 mL | INTRAVENOUS | Status: DC

## 2011-06-29 NOTE — Op Note (Signed)
[facility] [address] [place], [place]  [address]  COLONOSCOPY PROCEDURE REPORT  PATIENT:  Briana, Patton  MR#:  [id number] BIRTHDATE:  October 13, 1940, 70 yrs. old  GENDER:  female ENDOSCOPIST:  Vania Rea. Jarold Motto, MD, [facility] REF. BY:  Eleonore Chiquito, M.D. PROCEDURE DATE:  06/29/2011 PROCEDURE:  Colonoscopy with snare polypectomy ASA CLASS:  Class III INDICATIONS:  Routine Risk Screening MEDICATIONS:   propofol (Diprivan) 230 mg IV  DESCRIPTION OF PROCEDURE:   After the risks and benefits and of the procedure were explained, informed consent was obtained. Digital rectal exam was performed and revealed no abnormalities. The LB [id number] endoscope was introduced through the anus and advanced to the cecum, which was identified by both the appendix and ileocecal valve.  The quality of the prep was excellent, using MoviPrep.  The instrument was then slowly withdrawn as the colon was fully examined. <<PROCEDUREIMAGES>>  FINDINGS:  Moderate diverticulosis was found in the sigmoid to descending colon segments.  A sessile polyp was found. 4 mm sessile polyp in descending colon cold snare excised. Retroflexed views in the rectum revealed no abnormalities.    The scope was then withdrawn from the patient and the procedure completed.  COMPLICATIONS:  None ENDOSCOPIC IMPRESSION: 1) Moderate diverticulosis in the sigmoid to descending colon segments 2) Sessile polyp R/O ADENOMA. RECOMMENDATIONS: 1) Await pathology results 2) Repeat colonoscopy in 5 years if polyp adenomatous; otherwise 10 years 3) High fiber diet.  REPEAT EXAM:  No  ______________________________ Vania Rea. Jarold Motto, MD, [facility]  CC:  n. eSIGNED:   Vania Rea. Brae Schaafsma at 06/29/2011 03:51 PM  [name], [phone]

## 2011-06-29 NOTE — Progress Notes (Signed)
PROPOFOL PER S CAMP CRNA. MEDS TITATED PER MD AND CRNA. SEE SCANNED INTRA PROCEDURE REPORT. EWM

## 2011-06-29 NOTE — Op Note (Signed)
[facility] [address] [place], [place]  [address]  ENDOSCOPY PROCEDURE REPORT  PATIENT:  Briana, Patton  MR#:  [id number] BIRTHDATE:  04/11/1940, 70 yrs. old  GENDER:  female  ENDOSCOPIST:  Vania Rea. Jarold Motto, MD, [facility] Referred by:  Eleonore Chiquito, M.D.  PROCEDURE DATE:  06/29/2011 PROCEDURE:  EGD with biopsy, 432[address] ASA CLASS:  Class III INDICATIONS:  h/o Barrett's Esophagus  MEDICATIONS:   There was residual sedation effect present from prior procedure., propofol (Diprivan) 120 mg IV TOPICAL ANESTHETIC:  DESCRIPTION OF PROCEDURE:   After the risks and benefits of the procedure were explained, informed consent was obtained.  The LB GIF-H180 [id number] endoscope was introduced through the mouth and advanced to the second portion of the duodenum.  The instrument was slowly withdrawn as the mucosa was fully examined. <<PROCEDUREIMAGES>>  Barrett's esophagus was found. DISTAL 3-4 CM OF ESOPHAGUS BIOPSIED.SEE PICTURES.  The duodenal bulb was normal in appearance, as was the postbulbar duodenum.  The stomach was entered and closely examined. The antrum, angularis, and lesser curvature were well visualized, including a retroflexed view of the cardia and fundus. The stomach wall was normally distensable. The scope passed easily through the pylorus into the duodenum. Retroflexed views revealed no abnormalities.    The scope was then withdrawn from the patient and the procedure completed.  COMPLICATIONS:  None  ENDOSCOPIC IMPRESSION: 1) Barrett's esophagus 2) Normal duodenum 3) Normal stomach CHRONIC GERD,BARRETT'S AREA BIOPSIED,NO STRICTURE OR EROSIONS NOTED. RECOMMENDATIONS: 1) Await biopsy results 2) continue PPI F/U PER PATHOLOGY RESULTS.  ______________________________ Vania Rea. Jarold Motto, MD, [facility]  CC:  n. eSIGNED:   Vania Rea. Birttany Dechellis at 06/29/2011 03:57 PM  Briana Patton, Briana Patton

## 2011-06-29 NOTE — Patient Instructions (Signed)
Handouts were given on polyps, diverticulosis, high fiber diet, barrett's esophagus and GERD.  Per Dr. Jarold Motto continue taking acid reducing medication.  Blood sugar was 151 in the recovery room.  Resume your prior medications today.  Please call if any questions or concerns.    YOU HAD AN ENDOSCOPIC PROCEDURE TODAY AT THE [facility]: Refer to the procedure report that was given to you for any specific questions about what was found during the examination.  If the procedure report does not answer your questions, please call your gastroenterologist to clarify.  If you requested that your care partner not be given the details of your procedure findings, then the procedure report has been included in a sealed envelope for you to review at your convenience later.  YOU SHOULD EXPECT: Some feelings of bloating in the abdomen. Passage of more gas than usual.  Walking can help get rid of the air that was put into your GI tract during the procedure and reduce the bloating. If you had a lower endoscopy (such as a colonoscopy or flexible sigmoidoscopy) you may notice spotting of blood in your stool or on the toilet paper. If you underwent a bowel prep for your procedure, then you may not have a normal bowel movement for a few days.  DIET: Your first meal following the procedure should be a light meal and then it is ok to progress to your normal diet.  A half-sandwich or bowl of soup is an example of a good first meal.  Heavy or fried foods are harder to digest and may make you feel nauseous or bloated.  Likewise meals heavy in dairy and vegetables can cause extra gas to form and this can also increase the bloating.  Drink plenty of fluids but you should avoid alcoholic beverages for 24 hours.  ACTIVITY: Your care partner should take you home directly after the procedure.  You should plan to take it easy, moving slowly for the rest of the day.  You can resume normal activity the day after the procedure  however you should NOT DRIVE or use heavy machinery for 24 hours (because of the sedation medicines used during the test).    SYMPTOMS TO REPORT IMMEDIATELY: A gastroenterologist can be reached at any hour.  During normal business hours, 8:30 AM to 5:00 PM Monday through Friday, call [phone].  After hours and on weekends, please call the GI answering service at [phone] who will take a message and have the physician on call contact you.   Following lower endoscopy (colonoscopy or flexible sigmoidoscopy):  Excessive amounts of blood in the stool  Significant tenderness or worsening of abdominal pains  Swelling of the abdomen that is new, acute  Fever of 100F or higher  Following upper endoscopy (EGD)  Vomiting of blood or coffee ground material  New chest pain or pain under the shoulder blades  Painful or persistently difficult swallowing  New shortness of breath  Fever of 100F or higher  Black, tarry-looking stools  FOLLOW UP: If any biopsies were taken you will be contacted by phone or by letter within the next 1-3 weeks.  Call your gastroenterologist if you have not heard about the biopsies in 3 weeks.  Our staff will call the home number listed on your records the next business day following your procedure to check on you and address any questions or concerns that you may have at that time regarding the information given to you following your procedure. This  is a courtesy call and so if there is no answer at the home number and we have not heard from you through the emergency physician on call, we will assume that you have returned to your regular daily activities without incident.  SIGNATURES/CONFIDENTIALITY: You and/or your care partner have signed paperwork which will be entered into your electronic medical record.  These signatures attest to the fact that that the information above on your After Visit Summary has been reviewed and is understood.  Full responsibility of  the confidentiality of this discharge information lies with you and/or your care-partner.

## 2011-06-29 NOTE — Progress Notes (Signed)
No complaints noted in the recovery room. Maw  Patient did not experience any of the following events: a burn prior to discharge; a fall within the facility; wrong site/side/patient/procedure/implant event; or a hospital transfer or hospital admission upon discharge from the facility. (G8907) Patient did not have preoperative order for IV antibiotic SSI prophylaxis. (G8918)  

## 2011-06-30 ENCOUNTER — Telehealth: Payer: Self-pay | Admitting: *Deleted

## 2011-06-30 NOTE — Telephone Encounter (Signed)
  Follow up Call-  Call back number 06/29/2011  Post procedure Call Back phone  # [phone]  Permission to leave phone message Yes   Spoke with her husband  Patient questions:  Do you have a fever, pain , or abdominal swelling? no Pain Score  0 *  Have you tolerated food without any problems? yes  Have you been able to return to your normal activities? yes  Do you have any questions about your discharge instructions: Diet   no Medications  no Follow up visit  no  Do you have questions or concerns about your Care? no  Actions: * If pain score is 4 or above: No action needed, pain <4.

## 2011-07-05 ENCOUNTER — Encounter: Payer: Self-pay | Admitting: Gastroenterology

## 2011-09-14 ENCOUNTER — Encounter: Payer: Self-pay | Admitting: Internal Medicine

## 2011-09-14 ENCOUNTER — Ambulatory Visit (INDEPENDENT_AMBULATORY_CARE_PROVIDER_SITE_OTHER): Payer: Medicare Other | Admitting: Internal Medicine

## 2011-09-14 VITALS — BP 100/60 | Temp 98.0°F | Wt 181.0 lb

## 2011-09-14 DIAGNOSIS — E785 Hyperlipidemia, unspecified: Secondary | ICD-10-CM

## 2011-09-14 DIAGNOSIS — I10 Essential (primary) hypertension: Secondary | ICD-10-CM

## 2011-09-14 DIAGNOSIS — E119 Type 2 diabetes mellitus without complications: Secondary | ICD-10-CM

## 2011-09-14 MED ORDER — GLUCOSE BLOOD VI STRP: 1.0000 | ORAL_STRIP | Freq: Every day | Status: DC

## 2011-09-14 MED ORDER — PIOGLITAZONE HCL 45 MG PO TABS: 45.0000 mg | ORAL_TABLET | Freq: Every day | ORAL | Status: DC

## 2011-09-14 MED ORDER — ATORVASTATIN CALCIUM 40 MG PO TABS: 40.0000 mg | ORAL_TABLET | Freq: Every day | ORAL | Status: DC

## 2011-09-14 MED ORDER — BENAZEPRIL-HYDROCHLOROTHIAZIDE 20-12.5 MG PO TABS: 1.0000 | ORAL_TABLET | Freq: Every day | ORAL | Status: DC

## 2011-09-14 MED ORDER — METFORMIN HCL 1000 MG PO TABS: 1000.0000 mg | ORAL_TABLET | Freq: Two times a day (BID) | ORAL | Status: DC

## 2011-09-14 MED ORDER — GLIMEPIRIDE 4 MG PO TABS: 4.0000 mg | ORAL_TABLET | Freq: Every day | ORAL | Status: DC

## 2011-09-14 NOTE — Progress Notes (Signed)
Subjective:    Patient ID: Briana Patton, female    DOB: 02/28/1941, 71 y.o.   MRN: [id number]  HPI  71 year old patient who is seen today for followup. She has a history of type 2 diabetes and is on triple oral therapy. She states that due to some low blood sugar readings she has occasionally cut her Actos by half. Her last 2 hemoglobin A1c's have been 8.0. She complains of some nonspecific next tetanus and also some occasional leg cramps but she attributes to Lipitor. In general she is doing well. She does have obesity and has failed to lose any weight. She has osteoarthritis hypertension and a history of treated dyslipidemia. Lipid profile was checked in January.   Past Medical History  Diagnosis Date  . Diabetes mellitus   . Hyperlipidemia   . Hypertension   . Diverticulosis   . Osteoarthritis   . GERD (gastroesophageal reflux disease)   . Diverticulosis of colon (without mention of hemorrhage)   . Internal hemorrhoids without mention of complication   . Barrett esophagus 12/12/2000    egd bx    History   Social History  . Marital Status: Married    Spouse Name: N/A    Number of Children: N/A  . Years of Education: N/A   Occupational History  . Not on file.   Social History Main Topics  . Smoking status: Former Smoker    Quit date: 03/08/1998  . Smokeless tobacco: Never Used  . Alcohol Use: No  . Drug Use: No  . Sexually Active: Not on file   Other Topics Concern  . Not on file   Social History Narrative   Father age 33 complications of cardiac and cerebrovascular disease    Past Surgical History  Procedure Date  . Abdominal hysterectomy   . Cesarean section   . Lumbar laminectomy   . Knee arthroscopy     left knee surgery 1999  . Colonoscopy   . Upper gastrointestinal endoscopy     Family History  Problem Relation Age of Onset  . Diabetes Brother   . Diabetes Mother   . Diabetes Father   . Alcohol abuse Sister     X75  . Alcohol abuse Brother     . Colon cancer Neg Hx   . Esophageal cancer Neg Hx   . Rectal cancer Neg Hx   . Stomach cancer Neg Hx     Allergies  Allergen Reactions  . Penicillins     REACTION: unspecified    Current Outpatient Prescriptions on File Prior to Visit  Medication Sig Dispense Refill  . albuterol (PROAIR HFA) 108 (90 BASE) MCG/ACT inhaler Inhale 2 puffs into the lungs every 6 (six) hours as needed.        Marland Kitchen atorvastatin (LIPITOR) 40 MG tablet Take 1 tablet (40 mg total) by mouth daily.  90 tablet  6  . benazepril-hydrochlorthiazide (LOTENSIN HCT) 20-12.5 MG per tablet Take 1 tablet by mouth daily.  90 tablet  1  . glimepiride (AMARYL) 4 MG tablet Take 1 tablet (4 mg total) by mouth daily before breakfast.  180 tablet  6  . glucose blood (FREESTYLE LITE) test strip 1 each by Other route daily. Use as instructed  100 each  12  . hyoscyamine (LEVSIN SL) 0.125 MG SL tablet Place 0.125 mg under the tongue every 4 (four) hours as needed.        . loratadine-pseudoephedrine (CLARITIN-D 12-HOUR) 5-120 MG per tablet Take 1 tablet by mouth  2 (two) times daily.        . metFORMIN (GLUCOPHAGE) 1000 MG tablet Take 1 tablet (1,000 mg total) by mouth 2 (two) times daily with a meal.  180 tablet  6  . pioglitazone (ACTOS) 45 MG tablet Take 1 tablet (45 mg total) by mouth daily.  90 tablet  6  . Polyethyl Glycol-Propyl Glycol (SYSTANE OP) Apply to eye. As directed for dry eyes       . DISCONTD: dexlansoprazole (DEXILANT) 60 MG capsule Take 1 capsule (60 mg total) by mouth daily.  30 capsule  3    BP 100/60  Temp 98 F (36.7 C) (Oral)  Wt 181 lb (82.101 kg)     Review of Systems  Constitutional: Negative.   HENT: Negative for hearing loss, congestion, sore throat, rhinorrhea, dental problem, sinus pressure and tinnitus.   Eyes: Negative for pain, discharge and visual disturbance.  Respiratory: Negative for cough and shortness of breath.   Cardiovascular: Negative for chest pain, palpitations and leg swelling.   Gastrointestinal: Negative for nausea, vomiting, abdominal pain, diarrhea, constipation, blood in stool and abdominal distention.  Genitourinary: Negative for dysuria, urgency, frequency, hematuria, flank pain, vaginal bleeding, vaginal discharge, difficulty urinating, vaginal pain and pelvic pain.  Musculoskeletal: Positive for myalgias. Negative for joint swelling, arthralgias and gait problem.  Skin: Negative for rash.  Neurological: Negative for dizziness, syncope, speech difficulty, weakness, numbness and headaches.  Hematological: Negative for adenopathy.  Psychiatric/Behavioral: Negative for behavioral problems, dysphoric mood and agitation. The patient is not nervous/anxious.        Objective:   Physical Exam  Constitutional: She is oriented to person, place, and time. She appears well-developed and well-nourished.  HENT:  Head: Normocephalic.  Right Ear: External ear normal.  Left Ear: External ear normal.  Mouth/Throat: Oropharynx is clear and moist.  Eyes: Conjunctivae and EOM are normal. Pupils are equal, round, and reactive to light.  Neck: Normal range of motion. Neck supple. No thyromegaly present.  Cardiovascular: Normal rate, regular rhythm, normal heart sounds and intact distal pulses.   Pulmonary/Chest: Effort normal and breath sounds normal.  Abdominal: Soft. Bowel sounds are normal. She exhibits no mass. There is no tenderness.  Musculoskeletal: Normal range of motion.  Lymphadenopathy:    She has no cervical adenopathy.  Neurological: She is alert and oriented to person, place, and time.  Skin: Skin is warm and dry. No rash noted.  Psychiatric: She has a normal mood and affect. Her behavior is normal.          Assessment & Plan:    Diabetes mellitus. We'll check a hemoglobin A1c. Nonpharmacologic measures discussed Hypertension stable. Blood pressure well controlled will continue present medications Dyslipidemia. Side effects related to statin therapy  we'll continue same  Recheck 3 months All medications refilled

## 2011-09-14 NOTE — Patient Instructions (Signed)
You need to lose weight.  Consider a lower calorie diet and regular exercise.  Limit your sodium (Salt) intake    It is important that you exercise regularly, at least 20 minutes 3 to 4 times per week.  If you develop chest pain or shortness of breath seek  medical attention.   

## 2011-09-21 ENCOUNTER — Telehealth: Payer: Self-pay | Admitting: Internal Medicine

## 2011-09-21 NOTE — Telephone Encounter (Signed)
Spoke with pt - we dont have any test strips at this time in the office.. Check with insurance company to see what they will pay for and let me know -

## 2011-09-21 NOTE — Telephone Encounter (Signed)
Pt is out of test strips and states that she does not have the money to buy any and is requesting samples. Please contact

## 2011-10-01 ENCOUNTER — Telehealth: Payer: Self-pay | Admitting: Internal Medicine

## 2011-10-01 MED ORDER — GLUCOSE BLOOD VI STRP: 1.0000 | ORAL_STRIP | Freq: Every day | Status: DC

## 2011-10-01 NOTE — Telephone Encounter (Signed)
Faxed

## 2011-10-01 NOTE — Telephone Encounter (Signed)
Pt requesting a script be sent to [facility] phone [phone] fax [phone] for her test strips

## 2011-11-25 ENCOUNTER — Encounter: Payer: Self-pay | Admitting: Internal Medicine

## 2011-12-15 ENCOUNTER — Ambulatory Visit (INDEPENDENT_AMBULATORY_CARE_PROVIDER_SITE_OTHER): Payer: Medicare Other | Admitting: Internal Medicine

## 2011-12-15 ENCOUNTER — Encounter: Payer: Self-pay | Admitting: Internal Medicine

## 2011-12-15 VITALS — BP 108/68 | Temp 97.7°F | Wt 184.0 lb

## 2011-12-15 DIAGNOSIS — I10 Essential (primary) hypertension: Secondary | ICD-10-CM

## 2011-12-15 DIAGNOSIS — E785 Hyperlipidemia, unspecified: Secondary | ICD-10-CM

## 2011-12-15 DIAGNOSIS — E119 Type 2 diabetes mellitus without complications: Secondary | ICD-10-CM

## 2011-12-15 DIAGNOSIS — M1990 Unspecified osteoarthritis, unspecified site: Secondary | ICD-10-CM

## 2011-12-15 LAB — HEMOGLOBIN A1C: Hgb A1c MFr Bld: 6.3 % (ref 4.6–6.5)

## 2011-12-15 MED ORDER — PANTOPRAZOLE SODIUM 40 MG PO TBEC: 40.0000 mg | DELAYED_RELEASE_TABLET | Freq: Every day | ORAL | Status: DC

## 2011-12-15 NOTE — Progress Notes (Signed)
Subjective:    Patient ID: Briana Patton, female    DOB: 22-Aug-1940, 71 y.o.   MRN: [id number]  HPI  71 year old patient who has type 2 diabetes. Her last hemoglobin A1c 7.0. She remains on triple therapy. She remains quite inactive and limited somewhat by arthritic knee pain She has hypertension and dyslipidemia. She has been compliant with these medications. She has gastroesophageal reflux disease. Denies any cardiopulmonary complaints.  Past Medical History  Diagnosis Date  . Diabetes mellitus   . Hyperlipidemia   . Hypertension   . Diverticulosis   . Osteoarthritis   . GERD (gastroesophageal reflux disease)   . Diverticulosis of colon (without mention of hemorrhage)   . Internal hemorrhoids without mention of complication   . Barrett esophagus 12/12/2000    egd bx    History   Social History  . Marital Status: Married    Spouse Name: N/A    Number of Children: N/A  . Years of Education: N/A   Occupational History  . Not on file.   Social History Main Topics  . Smoking status: Former Smoker    Quit date: 03/08/1998  . Smokeless tobacco: Never Used  . Alcohol Use: No  . Drug Use: No  . Sexually Active: Not on file   Other Topics Concern  . Not on file   Social History Narrative   Father age 82 complications of cardiac and cerebrovascular disease    Past Surgical History  Procedure Date  . Abdominal hysterectomy   . Cesarean section   . Lumbar laminectomy   . Knee arthroscopy     left knee surgery 1999  . Colonoscopy   . Upper gastrointestinal endoscopy     Family History  Problem Relation Age of Onset  . Diabetes Brother   . Diabetes Mother   . Diabetes Father   . Alcohol abuse Sister     X67  . Alcohol abuse Brother   . Colon cancer Neg Hx   . Esophageal cancer Neg Hx   . Rectal cancer Neg Hx   . Stomach cancer Neg Hx     Allergies  Allergen Reactions  . Penicillins     REACTION: unspecified    Current Outpatient Prescriptions on File  Prior to Visit  Medication Sig Dispense Refill  . albuterol (PROAIR HFA) 108 (90 BASE) MCG/ACT inhaler Inhale 2 puffs into the lungs every 6 (six) hours as needed.        Marland Kitchen atorvastatin (LIPITOR) 40 MG tablet Take 1 tablet (40 mg total) by mouth daily.  90 tablet  6  . benazepril-hydrochlorthiazide (LOTENSIN HCT) 20-12.5 MG per tablet Take 1 tablet by mouth daily.  90 tablet  1  . dexlansoprazole (DEXILANT) 60 MG capsule Take 60 mg by mouth daily.      Marland Kitchen glimepiride (AMARYL) 4 MG tablet Take 1 tablet (4 mg total) by mouth daily before breakfast.  180 tablet  6  . glucose blood (FREESTYLE LITE) test strip 1 each by Other route daily. Use as instructed  100 each  3  . hyoscyamine (LEVSIN SL) 0.125 MG SL tablet Place 0.125 mg under the tongue every 4 (four) hours as needed.        . loratadine-pseudoephedrine (CLARITIN-D 12-HOUR) 5-120 MG per tablet Take 1 tablet by mouth 2 (two) times daily.        . metFORMIN (GLUCOPHAGE) 1000 MG tablet Take 1 tablet (1,000 mg total) by mouth 2 (two) times daily with a meal.  180 tablet  6  . pioglitazone (ACTOS) 45 MG tablet Take 1 tablet (45 mg total) by mouth daily.  90 tablet  6  . Polyethyl Glycol-Propyl Glycol (SYSTANE OP) Apply to eye. As directed for dry eyes         BP 108/68  Temp 97.7 F (36.5 C) (Oral)  Wt 184 lb (83.462 kg)       Review of Systems  Constitutional: Negative.   HENT: Negative for hearing loss, congestion, sore throat, rhinorrhea, dental problem, sinus pressure and tinnitus.   Eyes: Negative for pain, discharge and visual disturbance.  Respiratory: Negative for cough and shortness of breath.   Cardiovascular: Negative for chest pain, palpitations and leg swelling.  Gastrointestinal: Negative for nausea, vomiting, abdominal pain, diarrhea, constipation, blood in stool and abdominal distention.  Genitourinary: Negative for dysuria, urgency, frequency, hematuria, flank pain, vaginal bleeding, vaginal discharge, difficulty  urinating, vaginal pain and pelvic pain.  Musculoskeletal: Positive for back pain, arthralgias and gait problem. Negative for joint swelling.  Skin: Negative for rash.  Neurological: Negative for dizziness, syncope, speech difficulty, weakness, numbness and headaches.  Hematological: Negative for adenopathy.  Psychiatric/Behavioral: Negative for behavioral problems, dysphoric mood and agitation. The patient is not nervous/anxious.        Objective:   Physical Exam  Constitutional: She is oriented to person, place, and time. She appears well-developed and well-nourished.       Blood pressure 110/70 Weight 184  HENT:  Head: Normocephalic.  Right Ear: External ear normal.  Left Ear: External ear normal.  Mouth/Throat: Oropharynx is clear and moist.  Eyes: Conjunctivae normal and EOM are normal. Pupils are equal, round, and reactive to light.  Neck: Normal range of motion. Neck supple. No thyromegaly present.  Cardiovascular: Normal rate, regular rhythm, normal heart sounds and intact distal pulses.   Pulmonary/Chest: Effort normal and breath sounds normal.  Abdominal: Soft. Bowel sounds are normal. She exhibits no mass. There is no tenderness.  Musculoskeletal: Normal range of motion.  Lymphadenopathy:    She has no cervical adenopathy.  Neurological: She is alert and oriented to person, place, and time.  Skin: Skin is warm and dry. No rash noted.  Psychiatric: She has a normal mood and affect. Her behavior is normal.          Assessment & Plan:   Diabetes mellitus. Will check a hemoglobin A1c weight loss more exercise encouraged Hypertension excellent control Dyslipidemia we'll continue atorvastatin Gastroesophageal reflux disease   Recheck 3 months

## 2011-12-15 NOTE — Patient Instructions (Signed)
You need to lose weight.  Consider a lower calorie diet and regular exercise.  Limit your sodium (Salt) intake   Please check your hemoglobin A1c every 3 months    It is important that you exercise regularly, at least 20 minutes 3 to 4 times per week.  If you develop chest pain or shortness of breath seek  medical attention.

## 2011-12-16 ENCOUNTER — Telehealth: Payer: Self-pay | Admitting: Internal Medicine

## 2011-12-16 NOTE — Telephone Encounter (Signed)
Spoke with pt - did not get Flu vaccine when here - was not ordered . Discussed A1c

## 2011-12-16 NOTE — Telephone Encounter (Signed)
Pt home # is disconnected. Please call pt on cell#

## 2012-01-06 ENCOUNTER — Telehealth: Payer: Self-pay | Admitting: Internal Medicine

## 2012-01-06 MED ORDER — GLUCOSE BLOOD VI STRP: 1.0000 | ORAL_STRIP | Freq: Every day | Status: DC

## 2012-01-06 MED ORDER — FREESTYLE FREEDOM LITE W/DEVICE KIT: PACK | Status: DC

## 2012-01-06 NOTE — Telephone Encounter (Signed)
Pt needs new rxs for freestyle glucometer, test strips call into [facility] [address]

## 2012-01-06 NOTE — Telephone Encounter (Signed)
Done

## 2012-03-16 ENCOUNTER — Encounter: Payer: Medicare Other | Admitting: Internal Medicine

## 2012-04-11 ENCOUNTER — Other Ambulatory Visit: Payer: Self-pay | Admitting: Internal Medicine

## 2012-05-26 ENCOUNTER — Ambulatory Visit (INDEPENDENT_AMBULATORY_CARE_PROVIDER_SITE_OTHER): Payer: Medicare Other | Admitting: Internal Medicine

## 2012-05-26 ENCOUNTER — Encounter: Payer: Self-pay | Admitting: Internal Medicine

## 2012-05-26 VITALS — BP 130/70 | HR 73 | Temp 97.8°F | Resp 20 | Ht 59.25 in | Wt 186.0 lb

## 2012-05-26 DIAGNOSIS — M1990 Unspecified osteoarthritis, unspecified site: Secondary | ICD-10-CM

## 2012-05-26 DIAGNOSIS — Z0000 Encounter for general adult medical examination without abnormal findings: Secondary | ICD-10-CM

## 2012-05-26 DIAGNOSIS — E785 Hyperlipidemia, unspecified: Secondary | ICD-10-CM

## 2012-05-26 DIAGNOSIS — I10 Essential (primary) hypertension: Secondary | ICD-10-CM

## 2012-05-26 DIAGNOSIS — R002 Palpitations: Secondary | ICD-10-CM

## 2012-05-26 DIAGNOSIS — E119 Type 2 diabetes mellitus without complications: Secondary | ICD-10-CM

## 2012-05-26 DIAGNOSIS — R799 Abnormal finding of blood chemistry, unspecified: Secondary | ICD-10-CM

## 2012-05-26 LAB — COMPREHENSIVE METABOLIC PANEL
AST: 19 U/L (ref 0–37)
Alkaline Phosphatase: 46 U/L (ref 39–117)
BUN: 19 mg/dL (ref 6–23)
Creatinine, Ser: 0.8 mg/dL (ref 0.4–1.2)
Total Bilirubin: 0.5 mg/dL (ref 0.3–1.2)

## 2012-05-26 LAB — MICROALBUMIN / CREATININE URINE RATIO
Creatinine,U: 37.8 mg/dL
Microalb Creat Ratio: 0.5 mg/g (ref 0.0–30.0)
Microalb, Ur: 0.2 mg/dL (ref 0.0–1.9)

## 2012-05-26 LAB — LIPID PANEL
HDL: 66.2 mg/dL (ref >39.00)
VLDL: 27.4 mg/dL (ref 0.0–40.0)

## 2012-05-26 LAB — CBC WITH DIFFERENTIAL/PLATELET
Basophils Relative: 0.9 % (ref 0.0–3.0)
Eosinophils Absolute: 0.2 10*3/uL (ref 0.0–0.7)
Lymphocytes Relative: 39.1 % (ref 12.0–46.0)
MCHC: 33.3 g/dL (ref 30.0–36.0)
Monocytes Relative: 7.3 % (ref 3.0–12.0)
Neutrophils Relative %: 49.5 % (ref 43.0–77.0)
RBC: 3.82 Mil/uL — ABNORMAL LOW (ref 3.87–5.11)
WBC: 6.4 10*3/uL (ref 4.5–10.5)

## 2012-05-26 MED ORDER — GLIMEPIRIDE 4 MG PO TABS: 2.0000 mg | ORAL_TABLET | Freq: Every day | ORAL | Status: DC

## 2012-05-26 NOTE — Progress Notes (Signed)
Patient ID: Briana Patton, female   DOB: Feb 26, 1941, 72 y.o.   MRN: [id number]  Subjective:    Patient ID: Briana Patton, female    DOB: Feb 23, 1941, 72 y.o.   MRN: [id number]  HPI  History of Present Illness:   72 -year-old patient seen today for a wellness exam. Medical problems include type 2 diabetes, hypertension, and dyslipidemia. Her last hemoglobin A1c was 6.3. There is been some very modest weight loss. She has osteoarthritis, gastroesophageal reflux disease.   Wt Readings from Last 3 Encounters:  05/26/12 186 lb (84.369 kg)  12/15/11 184 lb (83.462 kg)  09/14/11 181 lb (82.101 kg)    Here for Medicare AWV:   1. Risk factors based on Past M, S, F history:cardiovascular risk factors include diabetes, hypertension, dyslipidemia  2. Physical Activities: no activity restrictions, but no regular exercise program. Walks occasionally  3. Depression/mood: no history depression, or mood disorder  4. Hearing: no hearing deficits  5. ADL's: independent in all aspects of the daily living  6. Fall Risk: low  7. Home Safety: Toprol is identified  8. Height, weight, &visual acuity:height and weight stable. No difficulty with visual acuity  9. Counseling: follow-up eye examination and encouraged  10. Labs ordered based on risk factors: laboratory profile, including lipid panel, and hemoglobin A1c will be reviewed  11. Referral Coordination- ophthalmology referral  12. Care Plan- heart healthy diet, exercise and weight loss. All encouraged  13. Cognitive Assessment- alert and oriented normal affect. No difficulty with short-term memory. Able to handle all executive functioning without difficulty   Preventive Screening-Counseling & Management  Alcohol-Tobacco  Smoking Status: quit   Current Problems (verified):   1) Vertigo, Peripheral (ICD-386.10)  2) Gerd (ICD-530.81)  3) Palpitations, Occasional (ICD-785.1)  4) Osteoarthritis (ICD-715.90)  5) Uri (ICD-465.9)  6) Diverticulosis,  Colon (ICD-562.10)  7) Arthroscopy, Left Knee, Hx of (ICD-V45.89)  8) Hypertension (ICD-401.9)  9) Hyperlipidemia (ICD-272.4)  10) Diabetes Mellitus, Type II (ICD-250.00)   Current Medications (verified):  1) Amaryl 4 Mg Tabs (Glimepiride) .Marland Kitchen.. 1 Tablet Two Times A Day  2) Protonix 40 Mg Tbec (Pantoprazole Sodium) .... Take 1 Tablet By Mouth Twice A Day  3) Levsin 0.125 Mg/12ml Elix (Hyoscyamine Sulfate) .Marland Kitchen.. 1 Q6h As Needed  4) Loratadine Allergy Relief D-12 5-120 Mg Tb12 (Loratadine-Pseudoephedrine) .... One Twice Daily  5) Freestyle Test Strp (Glucose Blood) .... Use Two Times A Day  6) Benazepril-Hydrochlorothiazide 20-12.5 Mg Tabs (Benazepril-Hydrochlorothiazide) .Marland Kitchen.. 1 Once Daily  7) Pravastatin Sodium 40 Mg Tabs (Pravastatin Sodium) .... One Daily  8) Metformin Hcl 1000 Mg Tabs (Metformin Hcl) .Marland Kitchen.. 1 Two Times A Day  9) Proair Hfa 108 (90 Base) Mcg/act Aers (Albuterol Sulfate) .... 2 Puffs Q 4 Hours For Wheezing   Allergies (verified):  1) Penicillin G Potassium (Penicillin G Potassium)  Past History:  Past Medical History:  Reviewed history from 06/25/2008 and no changes required.  Diabetes mellitus, type II  Hyperlipidemia  Hypertension  Diverticulosis, colon  Osteoarthritis  GERD   Past Surgical History:  Reviewed history from 09/04/2007 and no changes required.   Hysterectomy left salpingo-oophorectomy 1980  C-section x 3  Lumbar laminectomy 1989  arthroscopic left knee surgery 1999  colonoscopy 2002   Family History:  Reviewed history from 09/23/2009 and no changes required.   father died age 54, complications of cardiac and cerebrovascular disease  mother, age 38  Brother died of complications of DM   Social History:  Reviewed history from 02/27/2008 and no changes required.  Married  Never Smoked     Past Medical History  Diagnosis Date  . Diabetes mellitus   . Hyperlipidemia   . Hypertension   . Diverticulosis   . Osteoarthritis   . GERD  (gastroesophageal reflux disease)   . Diverticulosis of colon (without mention of hemorrhage)   . Internal hemorrhoids without mention of complication   . Barrett esophagus 12/12/2000    egd bx    History   Social History  . Marital Status: Married    Spouse Name: N/A    Number of Children: N/A  . Years of Education: N/A   Occupational History  . Not on file.   Social History Main Topics  . Smoking status: Former Smoker    Quit date: 03/08/1998  . Smokeless tobacco: Never Used  . Alcohol Use: No  . Drug Use: No  . Sexually Active: Not on file   Other Topics Concern  . Not on file   Social History Narrative   Father age 52 complications of cardiac and cerebrovascular disease          Past Surgical History  Procedure Laterality Date  . Abdominal hysterectomy    . Cesarean section    . Lumbar laminectomy    . Knee arthroscopy      left knee surgery 1999  . Colonoscopy    . Upper gastrointestinal endoscopy      Family History  Problem Relation Age of Onset  . Diabetes Brother   . Diabetes Mother   . Diabetes Father   . Alcohol abuse Sister     X4  . Alcohol abuse Brother   . Colon cancer Neg Hx   . Esophageal cancer Neg Hx   . Rectal cancer Neg Hx   . Stomach cancer Neg Hx     Allergies  Allergen Reactions  . Penicillins     REACTION: unspecified    Current Outpatient Prescriptions on File Prior to Visit  Medication Sig Dispense Refill  . albuterol (PROAIR HFA) 108 (90 BASE) MCG/ACT inhaler Inhale 2 puffs into the lungs every 6 (six) hours as needed.        Marland Kitchen atorvastatin (LIPITOR) 40 MG tablet Take 1 tablet (40 mg total) by mouth daily.  90 tablet  6  . benazepril-hydrochlorthiazide (LOTENSIN HCT) 20-12.5 MG per tablet Take 1 tablet by mouth daily.  90 tablet  1  . Blood Glucose Monitoring Suppl (FREESTYLE FREEDOM LITE) W/DEVICE KIT As directed , dx code 250.00  1 each  0  . glimepiride (AMARYL) 4 MG tablet Take 1 tablet (4 mg total) by mouth daily  before breakfast.  180 tablet  6  . glucose blood (FREESTYLE LITE) test strip 1 each by Other route daily. Use as instructed  100 each  3  . loratadine-pseudoephedrine (CLARITIN-D 12-HOUR) 5-120 MG per tablet Take 1 tablet by mouth 2 (two) times daily.        . metFORMIN (GLUCOPHAGE) 1000 MG tablet Take 1 tablet (1,000 mg total) by mouth 2 (two) times daily with a meal.  180 tablet  6  . pantoprazole (PROTONIX) 40 MG tablet Take 1 tablet (40 mg total) by mouth daily.  30 tablet  3  . pioglitazone (ACTOS) 45 MG tablet Take 1 tablet (45 mg total) by mouth daily.  90 tablet  6  . Polyethyl Glycol-Propyl Glycol (SYSTANE OP) Apply to eye. As directed for dry eyes        No current facility-administered medications on file prior  to visit.    BP 130/70  Pulse 73  Temp(Src) 97.8 F (36.6 C) (Oral)  Resp 20  Ht 4' 11.25" (1.505 m)  Wt 186 lb (84.369 kg)  BMI 37.25 kg/m2  SpO2 97%    Review of Systems  Constitutional: Negative for fever, appetite change, fatigue and unexpected weight change.  HENT: Negative for hearing loss, ear pain, nosebleeds, congestion, sore throat, mouth sores, trouble swallowing, neck stiffness, dental problem, voice change, sinus pressure and tinnitus.   Eyes: Negative for photophobia, pain, redness and visual disturbance.  Respiratory: Negative for cough, chest tightness and shortness of breath.   Cardiovascular: Negative for chest pain, palpitations and leg swelling.  Gastrointestinal: Negative for nausea, vomiting, abdominal pain, diarrhea, constipation, blood in stool, abdominal distention and rectal pain.  Genitourinary: Negative for dysuria, urgency, frequency, hematuria, flank pain, vaginal bleeding, vaginal discharge, difficulty urinating, genital sores, vaginal pain, menstrual problem and pelvic pain.  Musculoskeletal: Negative for back pain and arthralgias.  Skin: Negative for rash.  Neurological: Negative for dizziness, syncope, speech difficulty, weakness,  light-headedness, numbness and headaches.  Hematological: Negative for adenopathy. Does not bruise/bleed easily.  Psychiatric/Behavioral: Negative for suicidal ideas, behavioral problems, self-injury, dysphoric mood and agitation. The patient is not nervous/anxious.        Objective:   Physical Exam  Constitutional: She is oriented to person, place, and time. She appears well-developed and well-nourished.  HENT:  Head: Normocephalic and atraumatic.  Right Ear: External ear normal.  Left Ear: External ear normal.  Mouth/Throat: Oropharynx is clear and moist.  Eyes: Conjunctivae and EOM are normal.  Neck: Normal range of motion. Neck supple. No JVD present. No thyromegaly present.  Cardiovascular: Normal rate, regular rhythm, normal heart sounds and intact distal pulses.   No murmur heard. Pulmonary/Chest: Effort normal and breath sounds normal. She has no wheezes. She has no rales.  Abdominal: Soft. Bowel sounds are normal. She exhibits no distension and no mass. There is no tenderness. There is no rebound and no guarding.  Lower midline scar  Musculoskeletal: Normal range of motion. She exhibits no edema and no tenderness.  Neurological: She is alert and oriented to person, place, and time. She has normal reflexes. No cranial nerve deficit. She exhibits normal muscle tone. Coordination normal.  Skin: Skin is warm and dry. No rash noted.  Psychiatric: She has a normal mood and affect. Her behavior is normal.          Assessment & Plan:   Preventive health exam. Diabetes mellitus. The patient is on maximal dual therapy. We'll check a hemoglobin A1c. May need additional treatment Hypertension well controlled Dyslipidemia lipid profile will be reviewed  Recheck 3 months All meds refilled We'll schedule a followup colonoscopy

## 2012-05-26 NOTE — Patient Instructions (Signed)
Decrease glimepiride to one half tablet daily   Please check your hemoglobin A1c every 3 months    It is important that you exercise regularly, at least 20 minutes 3 to 4 times per week.  If you develop chest pain or shortness of breath seek  medical attention.  You need to lose weight.  Consider a lower calorie diet and regular exercise.

## 2012-07-12 ENCOUNTER — Other Ambulatory Visit: Payer: Self-pay | Admitting: Internal Medicine

## 2012-08-28 ENCOUNTER — Ambulatory Visit: Payer: Medicare Other | Admitting: Internal Medicine

## 2012-08-28 DIAGNOSIS — Z0289 Encounter for other administrative examinations: Secondary | ICD-10-CM

## 2012-09-04 ENCOUNTER — Ambulatory Visit (INDEPENDENT_AMBULATORY_CARE_PROVIDER_SITE_OTHER): Payer: Medicare Other | Admitting: Internal Medicine

## 2012-09-04 ENCOUNTER — Encounter: Payer: Self-pay | Admitting: Internal Medicine

## 2012-09-04 VITALS — BP 140/66 | HR 75 | Temp 98.1°F | Resp 20 | Wt 188.0 lb

## 2012-09-04 DIAGNOSIS — H81399 Other peripheral vertigo, unspecified ear: Secondary | ICD-10-CM

## 2012-09-04 DIAGNOSIS — E119 Type 2 diabetes mellitus without complications: Secondary | ICD-10-CM

## 2012-09-04 DIAGNOSIS — I10 Essential (primary) hypertension: Secondary | ICD-10-CM

## 2012-09-04 LAB — GLUCOSE, POCT (MANUAL RESULT ENTRY): POC Glucose: 77 mg/dl (ref 70–99)

## 2012-09-04 MED ORDER — MECLIZINE HCL 25 MG PO TABS: 25.0000 mg | ORAL_TABLET | Freq: Three times a day (TID) | ORAL | Status: DC | PRN

## 2012-09-04 NOTE — Patient Instructions (Signed)
Limit your sodium (Salt) intake   Please check your hemoglobin A1c every 3 months    It is important that you exercise regularly, at least 20 minutes 3 to 4 times per week.  If you develop chest pain or shortness of breath seek  medical attention.   

## 2012-09-04 NOTE — Progress Notes (Signed)
Subjective:    Patient ID: Briana Patton, female    DOB: [birth year], 72 y.o.   MRN: [id number]  HPI and 72 year old patient has a history of type 2 diabetes and hypertension. She presents today complaining of some positional vertigo. She has had the this symptom complex in the past. Her diabetes has been in the quite hard to control. Denies any hypoglycemia. Random blood sugar today 77. Amaryl dosing has been down titrated to 2 mg daily   Wt Readings from Last 3 Encounters:  09/04/12 188 lb (85.276 kg)  05/26/12 186 lb (84.369 kg)  12/15/11 184 lb (83.462 kg)     Past Medical History  Diagnosis Date  . Diabetes mellitus   . Hyperlipidemia   . Hypertension   . Diverticulosis   . Osteoarthritis   . GERD (gastroesophageal reflux disease)   . Diverticulosis of colon (without mention of hemorrhage)   . Internal hemorrhoids without mention of complication   . Barrett esophagus 12/12/2000    egd bx    History   Social History  . Marital Status: Married    Spouse Name: N/A    Number of Children: N/A  . Years of Education: N/A   Occupational History  . Not on file.   Social History Main Topics  . Smoking status: Former Smoker    Quit date: 03/08/1998  . Smokeless tobacco: Never Used  . Alcohol Use: No  . Drug Use: No  . Sexually Active: Not on file   Other Topics Concern  . Not on file   Social History Narrative   Father age 75 complications of cardiac and cerebrovascular disease          Past Surgical History  Procedure Laterality Date  . Abdominal hysterectomy    . Cesarean section    . Lumbar laminectomy    . Knee arthroscopy      left knee surgery 1999  . Colonoscopy    . Upper gastrointestinal endoscopy      Family History  Problem Relation Age of Onset  . Diabetes Brother   . Diabetes Mother   . Diabetes Father   . Alcohol abuse Sister     X42  . Alcohol abuse Brother   . Colon cancer Neg Hx   . Esophageal cancer Neg Hx   . Rectal cancer Neg Hx    . Stomach cancer Neg Hx     Allergies  Allergen Reactions  . Penicillins     REACTION: unspecified    Current Outpatient Prescriptions on File Prior to Visit  Medication Sig Dispense Refill  . albuterol (PROAIR HFA) 108 (90 BASE) MCG/ACT inhaler Inhale 2 puffs into the lungs every 6 (six) hours as needed.        Marland Kitchen atorvastatin (LIPITOR) 40 MG tablet Take 1 tablet (40 mg total) by mouth daily.  90 tablet  6  . benazepril-hydrochlorthiazide (LOTENSIN HCT) 20-12.5 MG per tablet TAKE ONE TABLET BY MOUTH EVERY DAY  90 tablet  0  . Blood Glucose Monitoring Suppl (FREESTYLE FREEDOM LITE) W/DEVICE KIT As directed , dx code 250.00  1 each  0  . glimepiride (AMARYL) 4 MG tablet Take 0.5 tablets (2 mg total) by mouth daily before breakfast.  180 tablet  6  . glucose blood (FREESTYLE LITE) test strip 1 each by Other route daily. Use as instructed  100 each  3  . loratadine-pseudoephedrine (CLARITIN-D 12-HOUR) 5-120 MG per tablet Take 1 tablet by mouth as needed.       Marland Kitchen  metFORMIN (GLUCOPHAGE) 1000 MG tablet Take 1 tablet (1,000 mg total) by mouth 2 (two) times daily with a meal.  180 tablet  6  . pantoprazole (PROTONIX) 40 MG tablet Take 1 tablet (40 mg total) by mouth daily.  30 tablet  3  . pioglitazone (ACTOS) 45 MG tablet Take 1 tablet (45 mg total) by mouth daily.  90 tablet  6  . Polyethyl Glycol-Propyl Glycol (SYSTANE OP) Apply to eye. As directed for dry eyes       . Wheat Dextrin (BENEFIBER PO) Take 3 capsules by mouth 2 (two) times daily.       No current facility-administered medications on file prior to visit.    BP 140/66  Pulse 75  Temp(Src) 98.1 F (36.7 C) (Oral)  Resp 20  Wt 188 lb (85.276 kg)  BMI 37.65 kg/m2  SpO2 97%     Review of Systems  HENT: Positive for congestion. Negative for hearing loss, sore throat, rhinorrhea, dental problem, sinus pressure and tinnitus.   Eyes: Negative for pain, discharge and visual disturbance.  Respiratory: Negative for cough and  shortness of breath.   Cardiovascular: Negative for chest pain, palpitations and leg swelling.  Gastrointestinal: Negative for nausea, vomiting, abdominal pain, diarrhea, constipation, blood in stool and abdominal distention.  Genitourinary: Negative for dysuria, urgency, frequency, hematuria, flank pain, vaginal bleeding, vaginal discharge, difficulty urinating, vaginal pain and pelvic pain.  Musculoskeletal: Negative for joint swelling, arthralgias and gait problem.  Skin: Negative for rash.  Neurological: Positive for dizziness and weakness. Negative for syncope, speech difficulty, numbness and headaches.  Hematological: Negative for adenopathy.  Psychiatric/Behavioral: Negative for behavioral problems, dysphoric mood and agitation. The patient is not nervous/anxious.        Objective:   Physical Exam  Constitutional: She is oriented to person, place, and time. She appears well-developed and well-nourished.  HENT:  Head: Normocephalic.  Right Ear: External ear normal.  Left Ear: External ear normal.  Mouth/Throat: Oropharynx is clear and moist.  Eyes: Conjunctivae and EOM are normal. Pupils are equal, round, and reactive to light.  Neck: Normal range of motion. Neck supple. No thyromegaly present.  Cardiovascular: Normal rate, regular rhythm, normal heart sounds and intact distal pulses.   Pulmonary/Chest: Effort normal and breath sounds normal.  Abdominal: Soft. Bowel sounds are normal. She exhibits no mass. There is no tenderness.  Musculoskeletal: Normal range of motion.  Lymphadenopathy:    She has no cervical adenopathy.  Neurological: She is alert and oriented to person, place, and time.  Skin: Skin is warm and dry. No rash noted.  Psychiatric: She has a normal mood and affect. Her behavior is normal.          Assessment & Plan:  Mild positional vertigo. Will treat with meclizine Diabetes mellitus. Tight control. Amaryl has been decreased to 2 mg daily Hypertension  well controlled  Weight loss exercise heart healthy diet all encouraged Recheck 3 months

## 2012-10-10 ENCOUNTER — Other Ambulatory Visit: Payer: Self-pay | Admitting: Internal Medicine

## 2012-10-15 ENCOUNTER — Other Ambulatory Visit: Payer: Self-pay | Admitting: Internal Medicine

## 2012-12-04 ENCOUNTER — Ambulatory Visit: Payer: Medicare Other | Admitting: Internal Medicine

## 2012-12-05 ENCOUNTER — Encounter: Payer: Self-pay | Admitting: Internal Medicine

## 2012-12-05 ENCOUNTER — Ambulatory Visit (INDEPENDENT_AMBULATORY_CARE_PROVIDER_SITE_OTHER): Payer: Medicare Other | Admitting: Internal Medicine

## 2012-12-05 VITALS — BP 124/70 | HR 67 | Temp 97.8°F | Resp 20 | Wt 182.0 lb

## 2012-12-05 DIAGNOSIS — E119 Type 2 diabetes mellitus without complications: Secondary | ICD-10-CM

## 2012-12-05 DIAGNOSIS — E785 Hyperlipidemia, unspecified: Secondary | ICD-10-CM

## 2012-12-05 DIAGNOSIS — Z23 Encounter for immunization: Secondary | ICD-10-CM

## 2012-12-05 DIAGNOSIS — I10 Essential (primary) hypertension: Secondary | ICD-10-CM

## 2012-12-05 LAB — HEMOGLOBIN A1C: Hgb A1c MFr Bld: 6.5 % (ref 4.6–6.5)

## 2012-12-05 NOTE — Progress Notes (Signed)
Subjective:    Patient ID: Briana Patton, female    DOB: 23-Aug-1940, 72 y.o.   MRN: [id number]  HPI  72 year old patient who is seen today for followup. She has a history of type 2 diabetes which has been well-controlled on oral agents. Fasting blood sugar this morning 118. Hemoglobin A1c's have been under excellent control. She has treated hypertension which also has done well. Blood pressure regimen includes ACE inhibition. Complaints include some mild intermittent right lower quadrant pain. She does have a history of diverticulosis and IBS. No complaints of pain today. She is due for her yearly eye exam.  No statin therapy    Review of Systems  Constitutional: Negative.   HENT: Negative for hearing loss, congestion, sore throat, rhinorrhea, dental problem, sinus pressure and tinnitus.   Eyes: Negative for pain, discharge and visual disturbance.  Respiratory: Negative for cough and shortness of breath.   Cardiovascular: Negative for chest pain, palpitations and leg swelling.  Gastrointestinal: Positive for abdominal pain. Negative for nausea, vomiting, diarrhea, constipation, blood in stool and abdominal distention.  Genitourinary: Negative for dysuria, urgency, frequency, hematuria, flank pain, vaginal bleeding, vaginal discharge, difficulty urinating, vaginal pain and pelvic pain.  Musculoskeletal: Negative for joint swelling, arthralgias and gait problem.  Skin: Negative for rash.  Neurological: Negative for dizziness, syncope, speech difficulty, weakness, numbness and headaches.  Hematological: Negative for adenopathy.  Psychiatric/Behavioral: Negative for behavioral problems, dysphoric mood and agitation. The patient is not nervous/anxious.        Objective:   Physical Exam  Constitutional: She is oriented to person, place, and time. She appears well-developed and well-nourished.  HENT:  Head: Normocephalic.  Right Ear: External ear normal.  Left Ear: External ear normal.   Mouth/Throat: Oropharynx is clear and moist.  Eyes: Conjunctivae and EOM are normal. Pupils are equal, round, and reactive to light.  Neck: Normal range of motion. Neck supple. No thyromegaly present.  Cardiovascular: Normal rate, regular rhythm, normal heart sounds and intact distal pulses.   Pulmonary/Chest: Effort normal and breath sounds normal.  Abdominal: Soft. Bowel sounds are normal. She exhibits no distension and no mass. There is no tenderness. There is no rebound and no guarding.  Musculoskeletal: Normal range of motion.  Lymphadenopathy:    She has no cervical adenopathy.  Neurological: She is alert and oriented to person, place, and time.  Skin: Skin is warm and dry. No rash noted.  Psychiatric: She has a normal mood and affect. Her behavior is normal.          Assessment & Plan:   Diabetes mellitus. Well controlled. We'll check a hemoglobin A1c Mild dyslipidemia. We'll start atorvastatin 20 Hypertension well controlled  CPX 6 months

## 2012-12-05 NOTE — Patient Instructions (Addendum)
Limit your sodium (Salt) intake    It is important that you exercise regularly, at least 20 minutes 3 to 4 times per week.  If you develop chest pain or shortness of breath seek  medical attention.  Return in 6 months for follow-up  

## 2013-03-14 ENCOUNTER — Encounter: Payer: Self-pay | Admitting: Family

## 2013-03-14 ENCOUNTER — Ambulatory Visit (INDEPENDENT_AMBULATORY_CARE_PROVIDER_SITE_OTHER): Payer: Medicare Other | Admitting: Family

## 2013-03-14 VITALS — BP 152/60 | HR 76 | Temp 98.0°F | Wt 187.0 lb

## 2013-03-14 DIAGNOSIS — R059 Cough, unspecified: Secondary | ICD-10-CM

## 2013-03-14 DIAGNOSIS — J309 Allergic rhinitis, unspecified: Secondary | ICD-10-CM

## 2013-03-14 DIAGNOSIS — R05 Cough: Secondary | ICD-10-CM

## 2013-03-14 MED ORDER — FLUTICASONE PROPIONATE 50 MCG/ACT NA SUSP: 2.0000 | Freq: Every day | NASAL | Status: DC

## 2013-03-14 NOTE — Progress Notes (Signed)
Subjective:    Patient ID: Briana Patton, female    DOB: 03/22/1940, 73 y.o.   MRN: [id number]  HPI  73 year old [place] female, nonsmoker, presenting with productive cough and nasal drainage. Produced yellow and green phlegm.  Bloody drainage from nose.  Problems present since October 1.  She coughs more during the night.  She has taken Corci[place] and it has not helped.      Review of Systems  Constitutional: Negative.   HENT: Positive for postnasal drip.        Postnasal drip. Bloody drainage  from nose  Eyes: Negative.   Respiratory: Positive for cough.        Yellow and green sputum  Cardiovascular: Negative.   Gastrointestinal: Negative.   Endocrine: Negative.   Genitourinary: Negative.   Musculoskeletal: Negative.   Allergic/Immunologic: Negative.   Neurological: Negative.   Hematological: Negative.   Psychiatric/Behavioral: Negative.    Past Medical History  Diagnosis Date  . Diabetes mellitus   . Hyperlipidemia   . Hypertension   . Diverticulosis   . Osteoarthritis   . GERD (gastroesophageal reflux disease)   . Diverticulosis of colon (without mention of hemorrhage)   . Internal hemorrhoids without mention of complication   . Barrett esophagus 12/12/2000    egd bx    History   Social History  . Marital Status: Married    Spouse Name: N/A    Number of Children: N/A  . Years of Education: N/A   Occupational History  . Not on file.   Social History Main Topics  . Smoking status: Former Smoker    Quit date: 03/08/1998  . Smokeless tobacco: Never Used  . Alcohol Use: No  . Drug Use: No  . Sexual Activity: Not on file   Other Topics Concern  . Not on file   Social History Narrative   Father age 4 complications of cardiac and cerebrovascular disease          Past Surgical History  Procedure Laterality Date  . Abdominal hysterectomy    . Cesarean section    . Lumbar laminectomy    . Knee arthroscopy      left knee surgery 1999  . Colonoscopy     . Upper gastrointestinal endoscopy      Family History  Problem Relation Age of Onset  . Diabetes Brother   . Diabetes Mother   . Diabetes Father   . Alcohol abuse Sister     X4  . Alcohol abuse Brother   . Colon cancer Neg Hx   . Esophageal cancer Neg Hx   . Rectal cancer Neg Hx   . Stomach cancer Neg Hx     Allergies  Allergen Reactions  . Penicillins     REACTION: unspecified    Current Outpatient Prescriptions on File Prior to Visit  Medication Sig Dispense Refill  . albuterol (PROAIR HFA) 108 (90 BASE) MCG/ACT inhaler Inhale 2 puffs into the lungs every 6 (six) hours as needed.        Marland Kitchen atorvastatin (LIPITOR) 40 MG tablet Take 1 tablet (40 mg total) by mouth daily.  90 tablet  6  . benazepril-hydrochlorthiazide (LOTENSIN HCT) 20-12.5 MG per tablet TAKE ONE TABLET BY MOUTH ONCE DAILY  90 tablet  1  . Blood Glucose Monitoring Suppl (FREESTYLE FREEDOM LITE) W/DEVICE KIT As directed , dx code 250.00  1 each  0  . glimepiride (AMARYL) 4 MG tablet Take 0.5 tablets (2 mg total) by mouth  daily before breakfast.  180 tablet  6  . glucose blood (FREESTYLE LITE) test strip 1 each by Other route daily. Use as instructed  100 each  3  . loratadine-pseudoephedrine (CLARITIN-D 12-HOUR) 5-120 MG per tablet Take 1 tablet by mouth as needed.       . meclizine (ANTIVERT) 25 MG tablet Take 1 tablet (25 mg total) by mouth 3 (three) times daily as needed.  30 tablet  3  . metFORMIN (GLUCOPHAGE) 1000 MG tablet Take 1 tablet (1,000 mg total) by mouth 2 (two) times daily with a meal.  180 tablet  6  . pantoprazole (PROTONIX) 40 MG tablet Take 1 tablet (40 mg total) by mouth daily.  30 tablet  3  . pioglitazone (ACTOS) 45 MG tablet TAKE ONE TABLET BY MOUTH ONCE DAILY  90 tablet  1  . Polyethyl Glycol-Propyl Glycol (SYSTANE OP) Apply to eye. As directed for dry eyes       . Wheat Dextrin (BENEFIBER PO) Take 3 capsules by mouth 2 (two) times daily.       No current facility-administered  medications on file prior to visit.    BP 152/60  Pulse 76  Temp(Src) 98 F (36.7 C) (Oral)  Wt 187 lb (84.823 kg)  SpO2 97%chart     Objective:   Physical Exam  Constitutional: She is oriented to person, place, and time. She appears well-developed and well-nourished.  HENT:  Head: Normocephalic.  Right Ear: External ear normal.  Left Ear: External ear normal.  Nose: Nose normal.  Mouth/Throat: Oropharynx is clear and moist.  Cardiovascular: Normal rate and regular rhythm.   Pulmonary/Chest: Effort normal and breath sounds normal.  Cough present   Neurological: She is alert and oriented to person, place, and time.  Skin: Skin is warm and dry.  Psychiatric: She has a normal mood and affect.          Assessment & Plan:  Assessment: 1. Allergic rhinitis 2. Cough  Plan: Flonase 2 sprays in each nostril once a day. Zyrtec over-the-counter 10 mg once daily. Patient by the office if symptoms worsen or persist. Recheck as scheduled, and as needed.

## 2013-03-14 NOTE — Patient Instructions (Signed)
1. Flonase 2 sprays in each nostril once a day 2. Zyrtec 10mg  (OTC) once daily.      Allergic Rhinitis Allergic rhinitis is when the mucous membranes in the nose respond to allergens. Allergens are particles in the air that cause your body to have an allergic reaction. This causes you to release allergic antibodies. Through a chain of events, these eventually cause you to release histamine into the blood stream (hence the use of antihistamines). Although meant to be protective to the body, it is this release that causes your discomfort, such as frequent sneezing, congestion and an itchy runny nose.  CAUSES  The pollen allergens may come from grasses, trees, and weeds. This is seasonal allergic rhinitis, or "hay fever." Other allergens cause year-round allergic rhinitis (perennial allergic rhinitis) such as house dust mite allergen, pet dander and mold spores.  SYMPTOMS   Nasal stuffiness (congestion).  Runny, itchy nose with sneezing and tearing of the eyes.  There is often an itching of the mouth, eyes and ears. It cannot be cured, but it can be controlled with medications. DIAGNOSIS  If you are unable to determine the offending allergen, skin or blood testing may find it. TREATMENT   Avoid the allergen.  Medications and allergy shots (immunotherapy) can help.  Hay fever may often be treated with antihistamines in pill or nasal spray forms. Antihistamines block the effects of histamine. There are over-the-counter medicines that may help with nasal congestion and swelling around the eyes. Check with your caregiver before taking or giving this medicine. If the treatment above does not work, there are many new medications your caregiver can prescribe. Stronger medications may be used if initial measures are ineffective. Desensitizing injections can be used if medications and avoidance fails. Desensitization is when a patient is given ongoing shots until the body becomes less sensitive to the  allergen. Make sure you follow up with your caregiver if problems continue. SEEK MEDICAL CARE IF:   You develop fever (more than 100.5 F (38.1 C).  You develop a cough that does not stop easily (persistent).  You have shortness of breath.  You start wheezing.  Symptoms interfere with normal daily activities. Document Released: 11/17/2000 Document Revised: 05/17/2011 Document Reviewed: 05/29/2008 [facility] Patient Information 2014 [place].

## 2013-03-20 ENCOUNTER — Other Ambulatory Visit: Payer: Self-pay | Admitting: Internal Medicine

## 2013-04-10 ENCOUNTER — Encounter: Payer: Self-pay | Admitting: Family Medicine

## 2013-04-10 ENCOUNTER — Ambulatory Visit (INDEPENDENT_AMBULATORY_CARE_PROVIDER_SITE_OTHER): Payer: Medicare Other | Admitting: Family Medicine

## 2013-04-10 VITALS — BP 120/58 | HR 72 | Temp 98.6°F | Wt 184.0 lb

## 2013-04-10 DIAGNOSIS — J189 Pneumonia, unspecified organism: Secondary | ICD-10-CM

## 2013-04-10 DIAGNOSIS — J069 Acute upper respiratory infection, unspecified: Secondary | ICD-10-CM

## 2013-04-10 MED ORDER — BENZONATATE 100 MG PO CAPS: 100.0000 mg | ORAL_CAPSULE | Freq: Two times a day (BID) | ORAL | Status: DC | PRN

## 2013-04-10 MED ORDER — AZITHROMYCIN 250 MG PO TABS: ORAL_TABLET | ORAL | Status: DC

## 2013-04-10 NOTE — Progress Notes (Signed)
Chief Complaint  Patient presents with  . Cough    congestion, hurts to cough, blood in mucus    HPI:  -started: 4 days ago -symptoms:nasal congestion, PND sore throat, cough, initially with chills and body aches -denies:fever, SOB, NVD, tooth pain -has tried: OTC cough medication -sick contacts/travel/risks: denies flu exposure or Ebola risks but works at [employer] -Hx of: allergies ROS: See pertinent positives and negatives per HPI.  Past Medical History  Diagnosis Date  . Diabetes mellitus   . Hyperlipidemia   . Hypertension   . Diverticulosis   . Osteoarthritis   . GERD (gastroesophageal reflux disease)   . Diverticulosis of colon (without mention of hemorrhage)   . Internal hemorrhoids without mention of complication   . Barrett esophagus 12/12/2000    egd bx    Past Surgical History  Procedure Laterality Date  . Abdominal hysterectomy    . Cesarean section    . Lumbar laminectomy    . Knee arthroscopy      left knee surgery 1999  . Colonoscopy    . Upper gastrointestinal endoscopy      Family History  Problem Relation Age of Onset  . Diabetes Brother   . Diabetes Mother   . Diabetes Father   . Alcohol abuse Sister     X51  . Alcohol abuse Brother   . Colon cancer Neg Hx   . Esophageal cancer Neg Hx   . Rectal cancer Neg Hx   . Stomach cancer Neg Hx     History   Social History  . Marital Status: Married    Spouse Name: N/A    Number of Children: N/A  . Years of Education: N/A   Social History Main Topics  . Smoking status: Former Smoker    Quit date: 03/08/1998  . Smokeless tobacco: Never Used  . Alcohol Use: No  . Drug Use: No  . Sexual Activity: None   Other Topics Concern  . None   Social History Narrative   Father age 39 complications of cardiac and cerebrovascular disease          Current outpatient prescriptions:albuterol (PROAIR HFA) 108 (90 BASE) MCG/ACT inhaler, Inhale 2 puffs into the lungs every 6 (six) hours as needed.  ,  Disp: , Rfl: ;  atorvastatin (LIPITOR) 40 MG tablet, Take 1 tablet (40 mg total) by mouth daily., Disp: 90 tablet, Rfl: 6;  benazepril-hydrochlorthiazide (LOTENSIN HCT) 20-12.5 MG per tablet, TAKE ONE TABLET BY MOUTH ONCE DAILY, Disp: 90 tablet, Rfl: 1 Blood Glucose Monitoring Suppl (FREESTYLE FREEDOM LITE) W/DEVICE KIT, As directed , dx code 250.00, Disp: 1 each, Rfl: 0;  fluticasone (FLONASE) 50 MCG/ACT nasal spray, Place 2 sprays into both nostrils daily., Disp: 16 g, Rfl: 6;  glimepiride (AMARYL) 4 MG tablet, Take 0.5 tablets (2 mg total) by mouth daily before breakfast., Disp: 180 tablet, Rfl: 6 glucose blood (FREESTYLE LITE) test strip, 1 each by Other route daily. Use as instructed, Disp: 100 each, Rfl: 3;  loratadine-pseudoephedrine (CLARITIN-D 12-HOUR) 5-120 MG per tablet, Take 1 tablet by mouth as needed. , Disp: , Rfl: ;  meclizine (ANTIVERT) 25 MG tablet, Take 1 tablet (25 mg total) by mouth 3 (three) times daily as needed., Disp: 30 tablet, Rfl: 3 metFORMIN (GLUCOPHAGE) 1000 MG tablet, Take 1 tablet (1,000 mg total) by mouth 2 (two) times daily with a meal., Disp: 180 tablet, Rfl: 6;  pantoprazole (PROTONIX) 40 MG tablet, Take 1 tablet (40 mg total) by mouth daily., Disp:  30 tablet, Rfl: 3;  pioglitazone (ACTOS) 45 MG tablet, TAKE ONE TABLET BY MOUTH ONCE DAILY, Disp: 90 tablet, Rfl: 0;  Polyethyl Glycol-Propyl Glycol (SYSTANE OP), Apply to eye. As directed for dry eyes , Disp: , Rfl:  Wheat Dextrin (BENEFIBER PO), Take 3 capsules by mouth 2 (two) times daily., Disp: , Rfl: ;  azithromycin (ZITHROMAX) 250 MG tablet, 2 tabs on the first day and then one tab daily for 4 days, Disp: 6 tablet, Rfl: 0;  benzonatate (TESSALON) 100 MG capsule, Take 1 capsule (100 mg total) by mouth 2 (two) times daily as needed for cough., Disp: 20 capsule, Rfl: 0  EXAM:  Filed Vitals:   04/10/13 1550  BP: 120/58  Pulse: 72  Temp: 98.6 F (37 C)    Body mass index is 36.85 kg/(m^2).  GENERAL: vitals reviewed  and listed above, alert, oriented, appears well hydrated and in no acute distress  HEENT: atraumatic, conjunttiva clear, no obvious abnormalities on inspection of external nose and ears, normal appearance of ear canals and TMs, clear nasal congestion, mild post oropharyngeal erythema with PND, no tonsillar edema or exudate, no sinus TTP  NECK: no obvious masses on inspection  LUNGS: rhonchi R base CV: HRRR, no peripheral edema  MS: moves all extremities without noticeable abnormality  PSYCH: pleasant and cooperative, no obvious depression or anxiety  ASSESSMENT AND PLAN:  Discussed the following assessment and plan:  CAP (community acquired pneumonia) - Plan: azithromycin (ZITHROMAX) 250 MG tablet, benzonatate (TESSALON) 100 MG capsule  Upper respiratory infection - Plan: azithromycin (ZITHROMAX) 250 MG tablet, benzonatate (TESSALON) 100 MG capsule  -mild influenza like symptoms with possible PNA per exam -discussed tx options and return precautions -of course, we advised to return or notify a doctor immediately if symptoms worsen or persist or new concerns arise.    Patient Instructions  -As we discussed, we have prescribed a new medication for you at this appointment. We discussed the common and serious potential adverse effects of this medication and you can review these and more with the pharmacist when you pick up your medication.  Please follow the instructions for use carefully and notify us immediately if you have any problems taking this medication.      Colin Benton R.

## 2013-04-10 NOTE — Progress Notes (Signed)
Pre visit review using our clinic review tool, if applicable. No additional management support is needed unless otherwise documented below in the visit note. 

## 2013-04-10 NOTE — Patient Instructions (Signed)

## 2013-04-18 ENCOUNTER — Telehealth: Payer: Self-pay | Admitting: Internal Medicine

## 2013-04-18 NOTE — Telephone Encounter (Signed)
Patient Information:  Caller Name: Shawan  Phone: [phone]  Patient: Briana Patton  Gender: Female  DOB: Feb 20, 1941  Age: 73 Years  PCP: Maudie Mercury (TEXT 1st, after 20 mins can call), [facility])  Office Follow Up:  Does the office need to follow up with this patient?: No  Instructions For The Office: N/A  RN Note:  Pt still has some wheezing in her L lung after pneumonia dx. Breathging easy.  Symptoms  Reason For Call & Symptoms: Wheezing  Reviewed Health History In EMR: Yes  Reviewed Medications In EMR: Yes  Reviewed Allergies In EMR: Yes  Reviewed Surgeries / Procedures: Yes  Date of Onset of Symptoms: 04/10/2013  Treatments Tried: Tessalon  Treatments Tried Worked: No  Guideline(s) Used:  Cough  Disposition Per Guideline:   Go to Office Now  Reason For Disposition Reached:   Wheezing is present  Advice Given:  Reassurance  Coughing is the way that our lungs remove irritants and mucus. It helps protect our lungs from getting pneumonia.  You can get a dry hacking cough after a chest cold. Sometimes this type of cough can last 1-3 weeks, and be worse at night.  Coughing Spasms:  Drink warm fluids. Inhale warm mist (Reason: both relax the airway and loosen up the phlegm).  Prevent Dehydration:  Drink adequate liquids.  Patient Will Follow Care Advice:  YES  Appointment Scheduled:  04/19/2013 08:45:00 Appointment Scheduled Provider:  Maudie Mercury (TEXT 1st, after 20 mins can call), [facility])

## 2013-04-19 ENCOUNTER — Ambulatory Visit (INDEPENDENT_AMBULATORY_CARE_PROVIDER_SITE_OTHER): Payer: Medicare Other | Admitting: Family Medicine

## 2013-04-19 ENCOUNTER — Encounter: Payer: Self-pay | Admitting: Family Medicine

## 2013-04-19 VITALS — BP 128/70 | HR 73 | Temp 99.1°F | Wt 187.0 lb

## 2013-04-19 DIAGNOSIS — R05 Cough: Secondary | ICD-10-CM

## 2013-04-19 DIAGNOSIS — R059 Cough, unspecified: Secondary | ICD-10-CM

## 2013-04-19 DIAGNOSIS — R062 Wheezing: Secondary | ICD-10-CM

## 2013-04-19 MED ORDER — ALBUTEROL SULFATE HFA 108 (90 BASE) MCG/ACT IN AERS: 2.0000 | INHALATION_SPRAY | Freq: Four times a day (QID) | RESPIRATORY_TRACT | Status: DC | PRN

## 2013-04-19 MED ORDER — FLUTICASONE PROPIONATE 50 MCG/ACT NA SUSP: 2.0000 | Freq: Every day | NASAL | Status: DC

## 2013-04-19 NOTE — Patient Instructions (Signed)
-  start flonase 2 sprays each nostril - take for 21 days (will start helping in about 1 week)  -try albuterol if coughing fit or wheezing to see if this helps  -follow up in 3-4 weeks

## 2013-04-19 NOTE — Progress Notes (Signed)
Chief Complaint  Patient presents with  . Wheezing    left lung bothering patient     HPI:  Briana Patton is a 73 yo pt of Dr. Burnice Logan here for an acute visit for:  Cough: -seen 2/3 with mild influenzal like symptoms and cough and tx with azithromycin for possible secondary pna and tessalon perles and did get much better -she reports: persistent cough and drainage in her throat started yesterday, yesterday felt like had wheeze once -denies: fevers, SOB, tooth pain, sinus pain, ear pain, vomiting   ROS: See pertinent positives and negatives per HPI.  Past Medical History  Diagnosis Date  . Diabetes mellitus   . Hyperlipidemia   . Hypertension   . Diverticulosis   . Osteoarthritis   . GERD (gastroesophageal reflux disease)   . Diverticulosis of colon (without mention of hemorrhage)   . Internal hemorrhoids without mention of complication   . Barrett esophagus 12/12/2000    egd bx    Past Surgical History  Procedure Laterality Date  . Abdominal hysterectomy    . Cesarean section    . Lumbar laminectomy    . Knee arthroscopy      left knee surgery 1999  . Colonoscopy    . Upper gastrointestinal endoscopy      Family History  Problem Relation Age of Onset  . Diabetes Brother   . Diabetes Mother   . Diabetes Father   . Alcohol abuse Sister     X84  . Alcohol abuse Brother   . Colon cancer Neg Hx   . Esophageal cancer Neg Hx   . Rectal cancer Neg Hx   . Stomach cancer Neg Hx     History   Social History  . Marital Status: Married    Spouse Name: N/A    Number of Children: N/A  . Years of Education: N/A   Social History Main Topics  . Smoking status: Former Smoker    Quit date: 03/08/1998  . Smokeless tobacco: Never Used  . Alcohol Use: No  . Drug Use: No  . Sexual Activity: None   Other Topics Concern  . None   Social History Narrative   Father age 37 complications of cardiac and cerebrovascular disease          Current outpatient  prescriptions:atorvastatin (LIPITOR) 40 MG tablet, Take 1 tablet (40 mg total) by mouth daily., Disp: 90 tablet, Rfl: 6;  benazepril-hydrochlorthiazide (LOTENSIN HCT) 20-12.5 MG per tablet, TAKE ONE TABLET BY MOUTH ONCE DAILY, Disp: 90 tablet, Rfl: 1;  benzonatate (TESSALON) 100 MG capsule, Take 1 capsule (100 mg total) by mouth 2 (two) times daily as needed for cough., Disp: 20 capsule, Rfl: 0 Blood Glucose Monitoring Suppl (FREESTYLE FREEDOM LITE) W/DEVICE KIT, As directed , dx code 250.00, Disp: 1 each, Rfl: 0;  glimepiride (AMARYL) 4 MG tablet, Take 0.5 tablets (2 mg total) by mouth daily before breakfast., Disp: 180 tablet, Rfl: 6;  glucose blood (FREESTYLE LITE) test strip, 1 each by Other route daily. Use as instructed, Disp: 100 each, Rfl: 3 loratadine-pseudoephedrine (CLARITIN-D 12-HOUR) 5-120 MG per tablet, Take 1 tablet by mouth as needed. , Disp: , Rfl: ;  meclizine (ANTIVERT) 25 MG tablet, Take 1 tablet (25 mg total) by mouth 3 (three) times daily as needed., Disp: 30 tablet, Rfl: 3;  metFORMIN (GLUCOPHAGE) 1000 MG tablet, Take 1 tablet (1,000 mg total) by mouth 2 (two) times daily with a meal., Disp: 180 tablet, Rfl: 6 pantoprazole (PROTONIX) 40 MG tablet,  Take 1 tablet (40 mg total) by mouth daily., Disp: 30 tablet, Rfl: 3;  pioglitazone (ACTOS) 45 MG tablet, TAKE ONE TABLET BY MOUTH ONCE DAILY, Disp: 90 tablet, Rfl: 0;  Polyethyl Glycol-Propyl Glycol (SYSTANE OP), Apply to eye. As directed for dry eyes , Disp: , Rfl: ;  Wheat Dextrin (BENEFIBER PO), Take 3 capsules by mouth 2 (two) times daily., Disp: , Rfl:  albuterol (PROVENTIL HFA;VENTOLIN HFA) 108 (90 BASE) MCG/ACT inhaler, Inhale 2 puffs into the lungs every 6 (six) hours as needed for wheezing or shortness of breath., Disp: 1 Inhaler, Rfl: 0;  fluticasone (FLONASE) 50 MCG/ACT nasal spray, Place 2 sprays into both nostrils daily., Disp: 16 g, Rfl: 0  EXAM:  Filed Vitals:   04/19/13 0853  BP: 128/70  Pulse: 73  Temp: 99.1 F (37.3 C)     Body mass index is 37.45 kg/(m^2).  GENERAL: vitals reviewed and listed above, alert, oriented, appears well hydrated and in no acute distress  HEENT: atraumatic, conjunttiva clear, no obvious abnormalities on inspection of external nose and ears, normal appearance of ear canals and TMs, clear nasal congestion, mild post oropharyngeal erythema with PND, no tonsillar edema or exudate, no sinus TTP  NECK: no obvious masses on inspection  LUNGS: clear to auscultation bilaterally, no wheezes, rales or rhonchi, good air movement  CV: HRRR, no peripheral edema  MS: moves all extremities without noticeable abnormality  PSYCH: pleasant and cooperative, no obvious depression or anxiety  ASSESSMENT AND PLAN:  Discussed the following assessment and plan:  Cough - Plan: albuterol (PROVENTIL HFA;VENTOLIN HFA) 108 (90 BASE) MCG/ACT inhaler, fluticasone (FLONASE) 50 MCG/ACT nasal spray  -hx of allergies -this is likely post inf PND and possible RAD though lung exam normal today --we discussed possible serious and likely etiologies, workup and treatment, treatment risks and return precautions -after this discussion, Martasia opted for flonase for PND and trial of alb - she has taken these before -follow up advised 3-4 weeks -of course, we advised Suni  to return or notify a doctor immediately if symptoms worsen or persist or new concerns arise.    -Patient advised to return or notify a doctor immediately if symptoms worsen or persist or new concerns arise.  Patient Instructions  -start flonase 2 sprays each nostril - take for 21 days (will start helping in about 1 week)  -try albuterol if coughing fit or wheezing to see if this helps  -follow up in 3-4 weeks     ,  R.

## 2013-04-19 NOTE — Progress Notes (Signed)
Pre visit review using our clinic review tool, if applicable. No additional management support is needed unless otherwise documented below in the visit note. 

## 2013-05-09 ENCOUNTER — Other Ambulatory Visit: Payer: Self-pay | Admitting: Internal Medicine

## 2013-05-29 ENCOUNTER — Telehealth: Payer: Self-pay | Admitting: Internal Medicine

## 2013-05-29 ENCOUNTER — Encounter: Payer: Self-pay | Admitting: Internal Medicine

## 2013-05-29 ENCOUNTER — Ambulatory Visit (INDEPENDENT_AMBULATORY_CARE_PROVIDER_SITE_OTHER): Payer: Medicare Other | Admitting: Internal Medicine

## 2013-05-29 VITALS — BP 130/76 | HR 67 | Temp 98.0°F | Resp 20 | Ht 59.5 in | Wt 189.0 lb

## 2013-05-29 DIAGNOSIS — I10 Essential (primary) hypertension: Secondary | ICD-10-CM

## 2013-05-29 DIAGNOSIS — Z23 Encounter for immunization: Secondary | ICD-10-CM

## 2013-05-29 DIAGNOSIS — Z0000 Encounter for general adult medical examination without abnormal findings: Secondary | ICD-10-CM

## 2013-05-29 DIAGNOSIS — E785 Hyperlipidemia, unspecified: Secondary | ICD-10-CM

## 2013-05-29 DIAGNOSIS — E119 Type 2 diabetes mellitus without complications: Secondary | ICD-10-CM

## 2013-05-29 LAB — CBC WITH DIFFERENTIAL/PLATELET
BASOS ABS: 0 10*3/uL (ref 0.0–0.1)
Basophils Relative: 0.6 % (ref 0.0–3.0)
EOS ABS: 0.2 10*3/uL (ref 0.0–0.7)
Eosinophils Relative: 2.7 % (ref 0.0–5.0)
HEMATOCRIT: 35.7 % — AB (ref 36.0–46.0)
Hemoglobin: 11.7 g/dL — ABNORMAL LOW (ref 12.0–15.0)
LYMPHS ABS: 2.6 10*3/uL (ref 0.7–4.0)
Lymphocytes Relative: 39.8 % (ref 12.0–46.0)
MCHC: 32.8 g/dL (ref 30.0–36.0)
MCV: 90.8 fl (ref 78.0–100.0)
MONO ABS: 0.4 10*3/uL (ref 0.1–1.0)
Monocytes Relative: 6.3 % (ref 3.0–12.0)
Neutro Abs: 3.3 10*3/uL (ref 1.4–7.7)
Neutrophils Relative %: 50.6 % (ref 43.0–77.0)
PLATELETS: 231 10*3/uL (ref 150.0–400.0)
RBC: 3.93 Mil/uL (ref 3.87–5.11)
RDW: 14.3 % (ref 11.5–14.6)
WBC: 6.5 10*3/uL (ref 4.5–10.5)

## 2013-05-29 LAB — TSH: TSH: 3.73 u[IU]/mL (ref 0.35–5.50)

## 2013-05-29 LAB — LIPID PANEL
Cholesterol: 244 mg/dL — ABNORMAL HIGH (ref 0–200)
HDL: 57.3 mg/dL (ref >39.00)
LDL CALC: 145 mg/dL — AB (ref 0–99)
Total CHOL/HDL Ratio: 4
Triglycerides: 210 mg/dL — ABNORMAL HIGH (ref 0.0–149.0)
VLDL: 42 mg/dL — AB (ref 0.0–40.0)

## 2013-05-29 LAB — COMPREHENSIVE METABOLIC PANEL
ALBUMIN: 3.9 g/dL (ref 3.5–5.2)
ALK PHOS: 51 U/L (ref 39–117)
ALT: 14 U/L (ref 0–35)
AST: 19 U/L (ref 0–37)
BUN: 16 mg/dL (ref 6–23)
CO2: 29 mEq/L (ref 19–32)
Calcium: 9.5 mg/dL (ref 8.4–10.5)
Chloride: 101 mEq/L (ref 96–112)
Creatinine, Ser: 0.9 mg/dL (ref 0.4–1.2)
GFR: 68.13 mL/min (ref >60.00)
Glucose, Bld: 100 mg/dL — ABNORMAL HIGH (ref 70–99)
POTASSIUM: 4.6 meq/L (ref 3.5–5.1)
SODIUM: 137 meq/L (ref 135–145)
Total Bilirubin: 0.6 mg/dL (ref 0.3–1.2)
Total Protein: 7.4 g/dL (ref 6.0–8.3)

## 2013-05-29 LAB — HEMOGLOBIN A1C: Hgb A1c MFr Bld: 6.2 % (ref 4.6–6.5)

## 2013-05-29 LAB — MICROALBUMIN / CREATININE URINE RATIO
CREATININE, U: 55.3 mg/dL
MICROALB UR: 0.1 mg/dL (ref 0.0–1.9)
Microalb Creat Ratio: 0.2 mg/g (ref 0.0–30.0)

## 2013-05-29 MED ORDER — PANTOPRAZOLE SODIUM 40 MG PO TBEC: 40.0000 mg | DELAYED_RELEASE_TABLET | Freq: Every day | ORAL | Status: DC

## 2013-05-29 NOTE — Progress Notes (Signed)
Pre-visit discussion using our clinic review tool. No additional management support is needed unless otherwise documented below in the visit note.  

## 2013-05-29 NOTE — Progress Notes (Signed)
Patient ID: Briana Patton, female   DOB: [birth year], 73 y.o.   MRN: [id number]  Subjective:    Patient ID: Briana Patton, female    DOB: [birth year], 73 y.o.   MRN: [id number]  HPI   History of Present Illness:   73  -year-old patient seen today for a wellness exam. Medical problems include type 2 diabetes, hypertension, and dyslipidemia. Her last hemoglobin A1c was 6.3. There is been some very modest weight loss. She has osteoarthritis, gastroesophageal reflux disease.   Wt Readings from Last 3 Encounters:  05/29/13 189 lb (85.73 kg)  04/19/13 187 lb (84.823 kg)  04/10/13 184 lb (83.462 kg)    Here for Medicare AWV:   1. Risk factors based on Past M, S, F history:cardiovascular risk factors include diabetes, hypertension, dyslipidemia  2. Physical Activities: no activity restrictions, but no regular exercise program. Walks occasionally  3. Depression/mood: no history depression, or mood disorder  4. Hearing: no hearing deficits  5. ADL's: independent in all aspects of the daily living  6. Fall Risk: low  7. Home Safety: Toprol is identified  8. Height, weight, &visual acuity:height and weight stable. No difficulty with visual acuity  9. Counseling: follow-up eye examination and encouraged  10. Labs ordered based on risk factors: laboratory profile, including lipid panel, and hemoglobin A1c will be reviewed  11. Referral Coordination- ophthalmology referral  12. Care Plan- heart healthy diet, exercise and weight loss. All encouraged  13. Cognitive Assessment- alert and oriented normal affect. No difficulty with short-term memory. Able to handle all executive functioning without difficulty   Preventive Screening-Counseling & Management  Alcohol-Tobacco  Smoking Status: quit   Current Problems (verified):   1) Vertigo, Peripheral (ICD-386.10)  2) Gerd (ICD-530.81)  3) Palpitations, Occasional (ICD-785.1)  4) Osteoarthritis (ICD-715.90)  5) Uri (ICD-465.9)  6) Diverticulosis,  Colon (ICD-562.10)  7) Arthroscopy, Left Knee, Hx of (ICD-V45.89)  8) Hypertension (ICD-401.9)  9) Hyperlipidemia (ICD-272.4)  10) Diabetes Mellitus, Type II (ICD-250.00)  11. Barrett's esophagus  EGD 4/13  Current Medications (verified):  1) Amaryl 4 Mg Tabs (Glimepiride) .Marland Kitchen.. 1 Tablet Two Times A Day  2) Protonix 40 Mg Tbec (Pantoprazole Sodium) .... Take 1 Tablet By Mouth Twice A Day  3) Levsin 0.125 Mg/34ml Elix (Hyoscyamine Sulfate) .Marland Kitchen.. 1 Q6h As Needed  4) Loratadine Allergy Relief D-12 5-120 Mg Tb12 (Loratadine-Pseudoephedrine) .... One Twice Daily  5) Freestyle Test Strp (Glucose Blood) .... Use Two Times A Day  6) Benazepril-Hydrochlorothiazide 20-12.5 Mg Tabs (Benazepril-Hydrochlorothiazide) .Marland Kitchen.. 1 Once Daily  7) Pravastatin Sodium 40 Mg Tabs (Pravastatin Sodium) .... One Daily  8) Metformin Hcl 1000 Mg Tabs (Metformin Hcl) .Marland Kitchen.. 1 Two Times A Day  9) Proair Hfa 108 (90 Base) Mcg/act Aers (Albuterol Sulfate) .... 2 Puffs Q 4 Hours For Wheezing   Allergies (verified):  1) Penicillin G Potassium (Penicillin G Potassium)   Past History:  Past Medical History:  Reviewed history from 06/25/2008 and no changes required.  Diabetes mellitus, type II  Hyperlipidemia  Hypertension  Diverticulosis, colon  Osteoarthritis  GERD   Past Surgical History:    Hysterectomy left salpingo-oophorectomy 1980  C-section x 3  Lumbar laminectomy 1989  arthroscopic left knee surgery 1999  colonoscopy 2002  4/13  Family History:    father died age 17, complications of cardiac and cerebrovascular disease  mother, age 92  Brother died of complications of DM   Social History:  Reviewed history from 02/27/2008 and no changes required.  Married  Never Smoked  Patient ID: Briana Patton, female   DOB: 1941-01-01, 73 y.o.   MRN: [id number]  Subjective:    Patient ID: Briana Patton, female    DOB: 05/05/1940, 73 y.o.   MRN: [id number]  HPI   History of Present Illness:   73  -year-old patient seen today for a wellness exam. Medical problems include type 2 diabetes, hypertension, and dyslipidemia. Her last hemoglobin A1c was 6.3. There is been some very modest weight loss. She has osteoarthritis, gastroesophageal reflux disease.   Wt Readings from Last 3 Encounters:  05/29/13 189 lb (85.73 kg)  04/19/13 187 lb (84.823 kg)  04/10/13 184 lb (83.462 kg)    Here for Medicare AWV:   1. Risk factors based on Past M, S, F history:cardiovascular risk factors include diabetes, hypertension, dyslipidemia  2. Physical Activities: no activity restrictions, but no regular exercise program. Walks occasionally  3. Depression/mood: no history depression, or mood disorder  4. Hearing: no hearing deficits  5. ADL's: independent in all aspects of the daily living  6. Fall Risk: low  7. Home Safety: Toprol is identified  8. Height, weight, &visual acuity:height and weight stable. No difficulty with visual acuity  9. Counseling: follow-up eye examination and encouraged  10. Labs ordered based on risk factors: laboratory profile, including lipid panel, and hemoglobin A1c will be reviewed  11. Referral Coordination- ophthalmology referral  12. Care Plan- heart healthy diet, exercise and weight loss. All encouraged  13. Cognitive Assessment- alert and oriented normal affect. No difficulty with short-term memory. Able to handle all executive functioning without difficulty   Preventive Screening-Counseling & Management  Alcohol-Tobacco  Smoking Status: quit   Current Problems (verified):   1) Vertigo, Peripheral (ICD-386.10)  2) Gerd (ICD-530.81)  3) Palpitations, Occasional (ICD-785.1)  4) Osteoarthritis (ICD-715.90)  5) Uri (ICD-465.9)  6) Diverticulosis,  Colon (ICD-562.10)  7) Arthroscopy, Left Knee, Hx of (ICD-V45.89)  8) Hypertension (ICD-401.9)  9) Hyperlipidemia (ICD-272.4)  10) Diabetes Mellitus, Type II (ICD-250.00)  11. Barrett's esophagus  EGD 4/13  Current Medications (verified):  1) Amaryl 4 Mg Tabs (Glimepiride) .Marland Kitchen.. 1 Tablet Two Times A Day  2) Protonix 40 Mg Tbec (Pantoprazole Sodium) .... Take 1 Tablet By Mouth Twice A Day  3) Levsin 0.125 Mg/74m Elix (Hyoscyamine Sulfate) ..Marland Kitchen. 1 Q6h As Needed  4) Loratadine Allergy Relief D-12 5-120 Mg Tb12 (Loratadine-Pseudoephedrine) .... One Twice Daily  5) Freestyle Test Strp (Glucose Blood) .... Use Two Times A Day  6) Benazepril-Hydrochlorothiazide 20-12.5 Mg Tabs (Benazepril-Hydrochlorothiazide) ..Marland Kitchen. 1 Once Daily  7) Pravastatin Sodium 40 Mg Tabs (Pravastatin Sodium) .... One Daily  8) Metformin Hcl 1000 Mg Tabs (Metformin Hcl) ..Marland Kitchen. 1 Two Times A Day  9) Proair Hfa 108 (90 Base) Mcg/act Aers (Albuterol Sulfate) .... 2 Puffs Q 4 Hours For Wheezing   Allergies (verified):  1) Penicillin G Potassium (Penicillin G Potassium)   Past History:  Past Medical History:  Reviewed history from 06/25/2008 and no changes required.  Diabetes mellitus, type II  Hyperlipidemia  Hypertension  Diverticulosis, colon  Osteoarthritis  GERD   Past Surgical History:    Hysterectomy left salpingo-oophorectomy 1980  C-section x 3  Lumbar laminectomy 1989  arthroscopic left knee surgery 1999  colonoscopy 2002  4/13  Family History:    father died age 72 complications of cardiac and cerebrovascular disease  mother, age 72 Brother died of complications of DM   Social History:  Reviewed history from 02/27/2008 and no changes required.  Married  Never Smoked  As directed for dry eyes       . Wheat Dextrin (BENEFIBER PO) Take 3 capsules by mouth 2 (two) times daily.       No current facility-administered medications on file prior to visit.    BP 130/76  Pulse 67  Temp(Src) 98 F (36.7 C) (Oral)  Resp 20  Ht 4' 11.5" (1.511 m)  Wt 189 lb (85.73 kg)  BMI 37.55 kg/m2  SpO2 98%    Review of Systems  Constitutional: Negative for fever, appetite change, fatigue and unexpected weight change.  HENT: Negative for congestion, dental problem, ear pain, hearing loss, mouth sores, nosebleeds, sinus pressure, sore throat, tinnitus, trouble swallowing and voice change.   Eyes: Negative for photophobia, pain, redness and visual disturbance.  Respiratory: Negative for cough, chest tightness and shortness of breath.   Cardiovascular: Negative for chest pain, palpitations and leg swelling.  Gastrointestinal: Negative for nausea, vomiting, abdominal pain, diarrhea, constipation, blood in stool, abdominal distention and rectal pain.  Genitourinary: Negative for dysuria, urgency, frequency, hematuria, flank pain, vaginal bleeding, vaginal discharge, difficulty urinating, genital sores, vaginal pain, menstrual problem and pelvic pain.   Musculoskeletal: Negative for arthralgias, back pain and neck stiffness.  Skin: Negative for rash.  Neurological: Negative for dizziness, syncope, speech difficulty, weakness, light-headedness, numbness and headaches.  Hematological: Negative for adenopathy. Does not bruise/bleed easily.  Psychiatric/Behavioral: Negative for suicidal ideas, behavioral problems, self-injury, dysphoric mood and agitation. The patient is not nervous/anxious.        Objective:   Physical Exam  Constitutional: She is oriented to person, place, and time. She appears well-developed and well-nourished.  HENT:  Head: Normocephalic and atraumatic.  Right Ear: External ear normal.  Left Ear: External ear normal.  Mouth/Throat: Oropharynx is clear and moist.  Eyes: Conjunctivae and EOM are normal.  Neck: Normal range of motion. Neck supple. No JVD present. No thyromegaly present.  Cardiovascular: Normal rate, regular rhythm, normal heart sounds and intact distal pulses.   No murmur heard. Decreased right pedal pulses   Pulmonary/Chest: Effort normal and breath sounds normal. She has no wheezes. She has no rales.  Abdominal: Soft. Bowel sounds are normal. She exhibits no distension and no mass. There is no tenderness. There is no rebound and no guarding.  Lower midline scar  Musculoskeletal: Normal range of motion. She exhibits no edema and no tenderness.  Neurological: She is alert and oriented to person, place, and time. She has normal reflexes. No cranial nerve deficit. She exhibits normal muscle tone. Coordination normal.  Skin: Skin is warm and dry. No rash noted.  Psychiatric: She has a normal mood and affect. Her behavior is normal.          Assessment & Plan:   Preventive health exam. Diabetes mellitus. The patient is on maximal dual therapy. Hypertension well controlled Dyslipidemia lipid profile will be reviewed History of Barrett's esophagus  Recheck 3 months All meds refilled

## 2013-05-29 NOTE — Patient Instructions (Addendum)
Limit your sodium (Salt) intake    It is important that you exercise regularly, at least 20 minutes 3 to 4 times per week.  If you develop chest pain or shortness of breath seek  medical attention.  You need to lose weight.  Consider a lower calorie diet and regular exercise.  Avoids foods high in acid such as tomatoes citrus juices, and spicy foods.  Avoid eating within two hours of lying down or before exercising.  Do not overheat.  Try smaller more frequent meals.  If symptoms persist, elevate the head of her bed 12 inches while sleeping.   Please check your hemoglobin A1c every 3 months  Please check your blood pressure on a regular basis.  If it is consistently greater than 150/90, please make an office appointment.  Please see your eye doctor yearly to check for diabetic eye damage

## 2013-05-29 NOTE — Telephone Encounter (Signed)
Relevant patient education mailed to patient.  

## 2013-06-07 ENCOUNTER — Telehealth: Payer: Self-pay

## 2013-06-07 NOTE — Telephone Encounter (Signed)
Relevant patient education mailed to patient.  

## 2013-06-27 ENCOUNTER — Telehealth: Payer: Self-pay | Admitting: Internal Medicine

## 2013-06-27 MED ORDER — GLIMEPIRIDE 4 MG PO TABS: 2.0000 mg | ORAL_TABLET | Freq: Every day | ORAL | Status: DC

## 2013-06-27 NOTE — Telephone Encounter (Signed)
Rx sent to pharmacy   

## 2013-06-27 NOTE — Telephone Encounter (Signed)
[facility] [place], [place] requesting refill of glimepiride (AMARYL) 4 MG tablet, pt completely out of meds

## 2013-08-30 ENCOUNTER — Encounter: Payer: Self-pay | Admitting: Internal Medicine

## 2013-08-30 ENCOUNTER — Ambulatory Visit (INDEPENDENT_AMBULATORY_CARE_PROVIDER_SITE_OTHER): Payer: Medicare Other | Admitting: Internal Medicine

## 2013-08-30 VITALS — BP 130/70 | HR 65 | Temp 98.7°F | Resp 20 | Ht 59.5 in | Wt 187.0 lb

## 2013-08-30 DIAGNOSIS — E119 Type 2 diabetes mellitus without complications: Secondary | ICD-10-CM

## 2013-08-30 DIAGNOSIS — E785 Hyperlipidemia, unspecified: Secondary | ICD-10-CM

## 2013-08-30 DIAGNOSIS — R079 Chest pain, unspecified: Secondary | ICD-10-CM

## 2013-08-30 DIAGNOSIS — I10 Essential (primary) hypertension: Secondary | ICD-10-CM

## 2013-08-30 LAB — HEMOGLOBIN A1C: HEMOGLOBIN A1C: 6.3 % (ref 4.6–6.5)

## 2013-08-30 MED ORDER — ASPIRIN 81 MG PO TABS: 81.0000 mg | ORAL_TABLET | Freq: Every day | ORAL | Status: DC

## 2013-08-30 NOTE — Progress Notes (Signed)
Subjective:    Patient ID: Briana Patton, female    DOB: 06/25/1940, 73 y.o.   MRN: [id number]  HPI  73 year old patient who has diabetes, dyslipidemia, and hypertension.  She is seen today for her three-month followup.  About 2 weeks ago.  She had a single episode of chest pain described as a heaviness in the mid chest area.  She states it lasted about 10 minutes.  This occurred after doing some outdoor yard work.  No recurrent pain and no history of exertional chest pain. Her diabetes has been well-controlled. Last hemoglobin A1c 6 point 2. She has treated hypertension and dyslipidemia.  It is unclear whether she has been taking atorvastatin  Past Medical History  Diagnosis Date  . Diabetes mellitus   . Hyperlipidemia   . Hypertension   . Diverticulosis   . Osteoarthritis   . GERD (gastroesophageal reflux disease)   . Diverticulosis of colon (without mention of hemorrhage)   . Internal hemorrhoids without mention of complication   . Barrett esophagus 12/12/2000    egd bx    History   Social History  . Marital Status: Married    Spouse Name: N/A    Number of Children: N/A  . Years of Education: N/A   Occupational History  . Not on file.   Social History Main Topics  . Smoking status: Former Smoker    Quit date: 03/08/1998  . Smokeless tobacco: Never Used  . Alcohol Use: No  . Drug Use: No  . Sexual Activity: Not on file   Other Topics Concern  . Not on file   Social History Narrative   Father age 19 complications of cardiac and cerebrovascular disease          Past Surgical History  Procedure Laterality Date  . Abdominal hysterectomy    . Cesarean section    . Lumbar laminectomy    . Knee arthroscopy      left knee surgery 1999  . Colonoscopy    . Upper gastrointestinal endoscopy      Family History  Problem Relation Age of Onset  . Diabetes Brother   . Diabetes Mother   . Diabetes Father   . Alcohol abuse Sister     X76  . Alcohol abuse Brother    . Colon cancer Neg Hx   . Esophageal cancer Neg Hx   . Rectal cancer Neg Hx   . Stomach cancer Neg Hx     Allergies  Allergen Reactions  . Penicillins     REACTION: unspecified    Current Outpatient Prescriptions on File Prior to Visit  Medication Sig Dispense Refill  . albuterol (PROVENTIL HFA;VENTOLIN HFA) 108 (90 BASE) MCG/ACT inhaler Inhale 2 puffs into the lungs every 6 (six) hours as needed for wheezing or shortness of breath.  1 Inhaler  0  . atorvastatin (LIPITOR) 40 MG tablet Take 1 tablet (40 mg total) by mouth daily.  90 tablet  6  . benazepril-hydrochlorthiazide (LOTENSIN HCT) 20-12.5 MG per tablet TAKE ONE TABLET BY MOUTH EVERY DAY  90 tablet  1  . Blood Glucose Monitoring Suppl (FREESTYLE FREEDOM LITE) W/DEVICE KIT As directed , dx code 250.00  1 each  0  . fluticasone (FLONASE) 50 MCG/ACT nasal spray Place 2 sprays into both nostrils daily.  16 g  0  . glucose blood (FREESTYLE LITE) test strip 1 each by Other route daily. Use as instructed  100 each  3  . loratadine-pseudoephedrine (CLARITIN-D 12-HOUR) 5-120 MG  per tablet Take 1 tablet by mouth as needed.       . meclizine (ANTIVERT) 25 MG tablet Take 1 tablet (25 mg total) by mouth 3 (three) times daily as needed.  30 tablet  3  . metFORMIN (GLUCOPHAGE) 1000 MG tablet Take 1 tablet (1,000 mg total) by mouth 2 (two) times daily with a meal.  180 tablet  6  . pantoprazole (PROTONIX) 40 MG tablet Take 1 tablet (40 mg total) by mouth daily.  30 tablet  5  . pioglitazone (ACTOS) 45 MG tablet TAKE ONE TABLET BY MOUTH ONCE DAILY  90 tablet  0  . Polyethyl Glycol-Propyl Glycol (SYSTANE OP) Apply to eye. As directed for dry eyes       . Wheat Dextrin (BENEFIBER PO) Take 3 capsules by mouth 2 (two) times daily.       No current facility-administered medications on file prior to visit.    BP 130/70  Pulse 65  Temp(Src) 98.7 F (37.1 C) (Oral)  Resp 20  Ht 4' 11.5" (1.511 m)  Wt 187 lb (84.823 kg)  BMI 37.15 kg/m2  SpO2  98%       Review of Systems  Constitutional: Negative.   HENT: Negative for congestion, dental problem, hearing loss, rhinorrhea, sinus pressure, sore throat and tinnitus.   Eyes: Negative for pain, discharge and visual disturbance.  Respiratory: Positive for chest tightness. Negative for cough and shortness of breath.   Cardiovascular: Positive for chest pain. Negative for palpitations and leg swelling.  Gastrointestinal: Negative for nausea, vomiting, abdominal pain, diarrhea, constipation, blood in stool and abdominal distention.  Genitourinary: Negative for dysuria, urgency, frequency, hematuria, flank pain, vaginal bleeding, vaginal discharge, difficulty urinating, vaginal pain and pelvic pain.  Musculoskeletal: Negative for arthralgias, gait problem and joint swelling.  Skin: Negative for rash.  Neurological: Negative for dizziness, syncope, speech difficulty, weakness, numbness and headaches.  Hematological: Negative for adenopathy.  Psychiatric/Behavioral: Negative for behavioral problems, dysphoric mood and agitation. The patient is not nervous/anxious.        Objective:   Physical Exam  Constitutional: She is oriented to person, place, and time. She appears well-developed and well-nourished.  HENT:  Head: Normocephalic.  Right Ear: External ear normal.  Left Ear: External ear normal.  Mouth/Throat: Oropharynx is clear and moist.  Eyes: Conjunctivae and EOM are normal. Pupils are equal, round, and reactive to light.  Neck: Normal range of motion. Neck supple. No thyromegaly present.  Cardiovascular: Normal rate, regular rhythm and normal heart sounds.   Decreased right dorsalis pedis pulse.  The other pedal pulses full  Pulmonary/Chest: Effort normal and breath sounds normal.  Abdominal: Soft. Bowel sounds are normal. She exhibits no mass. There is no tenderness.  Musculoskeletal: Normal range of motion.  Lymphadenopathy:    She has no cervical adenopathy.    Neurological: She is alert and oriented to person, place, and time.  Skin: Skin is warm and dry. No rash noted.  Psychiatric: She has a normal mood and affect. Her behavior is normal.          Assessment & Plan:   Chest pain in a patient with multiple cardiac risk factors.  We'll set up for a nuclear stress test Diabetes mellitus.  We'll check a hemoglobin A1c.  Hypertension, well controlled Dyslipidemia

## 2013-08-30 NOTE — Patient Instructions (Signed)
Take aspirin 81 mg daily Report any further chest pain  Cardiac stress test as scheduled  Limit your sodium (Salt) intake

## 2013-08-30 NOTE — Progress Notes (Signed)
Pre-visit discussion using our clinic review tool. No additional management support is needed unless otherwise documented below in the visit note.  

## 2013-08-31 ENCOUNTER — Telehealth: Payer: Self-pay | Admitting: Internal Medicine

## 2013-08-31 MED ORDER — ATORVASTATIN CALCIUM 40 MG PO TABS
40.0000 mg | ORAL_TABLET | Freq: Every day | ORAL | Status: DC
Start: 2013-08-31 — End: 2014-03-18

## 2013-08-31 NOTE — Telephone Encounter (Signed)
Spoke to pt told her there were no Rx's given to her yesterday she was just told to start OTC Aspirin 81 mg. Pt said she was told to take Lipitor. Told pt you already have a Rx for that. Pt said but she has not been taking it. Told pt I will send new Rx to the pharmacy for her. Pt verbalized understanding.

## 2013-08-31 NOTE — Telephone Encounter (Signed)
Pt was seen yesterday and lost rxs

## 2013-09-05 ENCOUNTER — Ambulatory Visit (HOSPITAL_COMMUNITY): Payer: Medicare Other | Attending: Internal Medicine | Admitting: Radiology

## 2013-09-05 VITALS — BP 138/63 | HR 61 | Ht 59.5 in | Wt 186.0 lb

## 2013-09-05 DIAGNOSIS — R002 Palpitations: Secondary | ICD-10-CM | POA: Insufficient documentation

## 2013-09-05 DIAGNOSIS — R079 Chest pain, unspecified: Secondary | ICD-10-CM

## 2013-09-05 DIAGNOSIS — R0602 Shortness of breath: Secondary | ICD-10-CM | POA: Insufficient documentation

## 2013-09-05 MED ORDER — TECHNETIUM TC 99M SESTAMIBI GENERIC - CARDIOLITE
10.0000 | Freq: Once | INTRAVENOUS | Status: AC | PRN
  Administered 2013-09-05: 10 via INTRAVENOUS

## 2013-09-05 MED ORDER — TECHNETIUM TC 99M SESTAMIBI GENERIC - CARDIOLITE
30.0000 | Freq: Once | INTRAVENOUS | Status: AC | PRN
  Administered 2013-09-05: 30 via INTRAVENOUS

## 2013-09-05 MED ORDER — REGADENOSON 0.4 MG/5ML IV SOLN
0.4000 mg | Freq: Once | INTRAVENOUS | Status: AC
  Administered 2013-09-05: 0.4 mg via INTRAVENOUS

## 2013-09-05 NOTE — Progress Notes (Signed)
Briana Patton [address] [place], [place] [address] [phone]    Cardiology Nuclear Med Study  Briana Patton is a 73 y.o. female     MRN : [id number]     DOB: [birth year]  Procedure Date: 09/05/2013  Nuclear Med Background Indication for Stress Test:  Evaluation for Ischemia History:  No known CAD Cardiac Risk Factors: History of Smoking, Hypertension, IDDM Type 2 and Lipids  Symptoms:  Chest Pain (last date of chest discomfort was three weeks ago), Palpitations and SOB   Nuclear Pre-Procedure Caffeine/Decaff Intake:  None NPO After: 9:00pm   Lungs:  clear O2 Sat: 99% on room air. IV 0.9% NS with Angio Cath:  22g  IV Site: R Hand  IV Started by:  Crissie Figures, RN  Chest Size (in):  42 Cup Size: C  Height: 4' 11.5" (1.511 m)  Weight:  186 lb (84.369 kg)  BMI:  Body mass index is 36.95 kg/(m^2). Tech Comments:  N/A    Nuclear Med Study 1 or 2 day study: 1 day  Stress Test Type:  Lexiscan  Reading MD: N/A  Order Authorizing Provider:  Royetta Car, MD  Resting Radionuclide: Technetium 53m Sestamibi  Resting Radionuclide Dose: 11.0 mCi   Stress Radionuclide:  Technetium 87m Sestamibi  Stress Radionuclide Dose: 33.0 mCi           Stress Protocol Rest HR: 61 Stress HR: 99  Rest BP: 138/63 Stress BP: 181/63  Exercise Time (min): n/a METS: n/a           Dose of Adenosine (mg):  n/a Dose of Lexiscan: 0.4 mg  Dose of Atropine (mg): n/a Dose of Dobutamine: n/a mcg/kg/min (at max HR)  Stress Test Technologist: Glade Lloyd, BS-ES  Nuclear Technologist:  Charlton Amor, CNMT     Rest Procedure:  Myocardial perfusion imaging was performed at rest 45 minutes following the intravenous administration of Technetium 41m Sestamibi. Rest ECG: Normal sinus rhythm. Nonspecific ST-T wave abnormalities.  Stress Procedure:  The patient received IV Lexiscan 0.4 mg over 15-seconds with concurrent low level exercise and then Technetium 30m Sestamibi  was injected at 30-seconds while the patient continued walking one more minute.  Quantitative spect images were obtained after a 45-minute delay.  During the infusion of Lexiscan the patient complained of stomach cramps, tightness in her lower back and head pressure.  These symptoms began to resolve in recovery.  Stress ECG: No significant change from baseline ECG  QPS Raw Data Images:  Normal; no motion artifact; normal heart/lung ratio. Stress Images:  Normal homogeneous uptake in all areas of the myocardium. Rest Images:  Normal homogeneous uptake in all areas of the myocardium. Subtraction (SDS):  No evidence of ischemia. Transient Ischemic Dilatation (Normal <1.22):  0.96 Lung/Heart Ratio (Normal <0.45):  0.32  Quantitative Gated Spect Images QGS EDV:  69 ml QGS ESV:  14 ml  Impression Exercise Capacity:  Lexiscan with low level exercise. BP Response:  Normal blood pressure response. Clinical Symptoms:  Tightness in lower back, stomach cramps ECG Impression:  No significant ST segment change suggestive of ischemia. Comparison with Prior Nuclear Study: No images to compare  Overall Impression:  Normal stress nuclear study. There is no scar or ischemia. This is a low risk scan.  LV Ejection Fraction: 55%.  LV Wall Motion:  Normal Wall Motion.  Dola Argyle, MD

## 2013-09-06 ENCOUNTER — Telehealth: Payer: Self-pay | Admitting: Internal Medicine

## 2013-09-06 NOTE — Telephone Encounter (Signed)
Left message on voicemail to call office.  

## 2013-09-06 NOTE — Telephone Encounter (Signed)
Pt is calling back requesting stress test results. Please call pt on cell [phone]

## 2013-09-06 NOTE — Telephone Encounter (Signed)
Pt called back and notified see result note.

## 2013-09-10 ENCOUNTER — Other Ambulatory Visit: Payer: Self-pay | Admitting: Internal Medicine

## 2013-11-10 ENCOUNTER — Other Ambulatory Visit: Payer: Self-pay | Admitting: Internal Medicine

## 2013-11-13 ENCOUNTER — Other Ambulatory Visit: Payer: Self-pay | Admitting: Internal Medicine

## 2013-11-21 ENCOUNTER — Ambulatory Visit (INDEPENDENT_AMBULATORY_CARE_PROVIDER_SITE_OTHER): Payer: Medicare Other | Admitting: Internal Medicine

## 2013-11-21 ENCOUNTER — Encounter: Payer: Self-pay | Admitting: Internal Medicine

## 2013-11-21 VITALS — BP 110/66 | HR 63 | Temp 98.1°F | Resp 20 | Ht 59.5 in | Wt 184.0 lb

## 2013-11-21 DIAGNOSIS — I10 Essential (primary) hypertension: Secondary | ICD-10-CM

## 2013-11-21 DIAGNOSIS — R071 Chest pain on breathing: Secondary | ICD-10-CM

## 2013-11-21 DIAGNOSIS — M1990 Unspecified osteoarthritis, unspecified site: Secondary | ICD-10-CM

## 2013-11-21 DIAGNOSIS — R0789 Other chest pain: Secondary | ICD-10-CM

## 2013-11-21 DIAGNOSIS — E119 Type 2 diabetes mellitus without complications: Secondary | ICD-10-CM

## 2013-11-21 MED ORDER — MELOXICAM 7.5 MG PO TABS: 7.5000 mg | ORAL_TABLET | Freq: Every day | ORAL | Status: DC

## 2013-11-21 NOTE — Patient Instructions (Signed)
Limit your sodium (Salt) intake  You need to lose weight.  Consider a lower calorie diet and regular exercise.  Call or return to clinic prn if these symptoms worsen or fail to improve as anticipated.

## 2013-11-21 NOTE — Progress Notes (Signed)
Subjective:    Patient ID: Briana Patton, female    DOB: [birth year], 73 y.o.   MRN: [id number]  HPI Wt Readings from Last 3 Encounters:  11/21/13 184 lb (83.462 kg)  09/05/13 186 lb (84.369 kg)  08/30/13 187 lb (84.823 kg)     Review of Systems     Objective:   Physical Exam        Assessment & Plan:

## 2013-11-21 NOTE — Progress Notes (Signed)
Subjective:    Patient ID: Briana Patton, female    DOB: 08-20-1940, 73 y.o.   MRN: [id number]  HPI  73 year old patient, who presents with an eight-day history of right lateral chest wall pain.  No history of trauma.  Pain is aggravated by deep inspiration and twisting at the waist especially to the right.  No rash.  She does have a history of osteoarthritis She has type 2 diabetes, which has been under very tight control.  She is scheduled for diabetic followup next week.  Blood sugars have been well controlled.  Weight is down modestly.  Past Medical History  Diagnosis Date  . Diabetes mellitus   . Hyperlipidemia   . Hypertension   . Diverticulosis   . Osteoarthritis   . GERD (gastroesophageal reflux disease)   . Diverticulosis of colon (without mention of hemorrhage)   . Internal hemorrhoids without mention of complication   . Barrett esophagus 12/12/2000    egd bx    History   Social History  . Marital Status: Married    Spouse Name: N/A    Number of Children: N/A  . Years of Education: N/A   Occupational History  . Not on file.   Social History Main Topics  . Smoking status: Former Smoker    Quit date: 03/08/1998  . Smokeless tobacco: Never Used  . Alcohol Use: No  . Drug Use: No  . Sexual Activity: Not on file   Other Topics Concern  . Not on file   Social History Narrative   Father age 13 complications of cardiac and cerebrovascular disease          Past Surgical History  Procedure Laterality Date  . Abdominal hysterectomy    . Cesarean section    . Lumbar laminectomy    . Knee arthroscopy      left knee surgery 1999  . Colonoscopy    . Upper gastrointestinal endoscopy      Family History  Problem Relation Age of Onset  . Diabetes Brother   . Diabetes Mother   . Diabetes Father   . Alcohol abuse Sister     X40  . Alcohol abuse Brother   . Colon cancer Neg Hx   . Esophageal cancer Neg Hx   . Rectal cancer Neg Hx   . Stomach cancer Neg  Hx     Allergies  Allergen Reactions  . Penicillins     REACTION: unspecified    Current Outpatient Prescriptions on File Prior to Visit  Medication Sig Dispense Refill  . albuterol (PROVENTIL HFA;VENTOLIN HFA) 108 (90 BASE) MCG/ACT inhaler Inhale 2 puffs into the lungs every 6 (six) hours as needed for wheezing or shortness of breath.  1 Inhaler  0  . aspirin 81 MG tablet Take 1 tablet (81 mg total) by mouth daily.  30 tablet    . atorvastatin (LIPITOR) 40 MG tablet Take 1 tablet (40 mg total) by mouth daily.  90 tablet  3  . benazepril-hydrochlorthiazide (LOTENSIN HCT) 20-12.5 MG per tablet TAKE ONE TABLET BY MOUTH ONCE DAILY  90 tablet  0  . Blood Glucose Monitoring Suppl (FREESTYLE FREEDOM LITE) W/DEVICE KIT As directed , dx code 250.00  1 each  0  . fluticasone (FLONASE) 50 MCG/ACT nasal spray Place 2 sprays into both nostrils daily.  16 g  0  . glimepiride (AMARYL) 4 MG tablet Take 4 mg by mouth daily before breakfast.      . glucose blood (FREESTYLE LITE)  test strip 1 each by Other route daily. Use as instructed  100 each  3  . loratadine-pseudoephedrine (CLARITIN-D 12-HOUR) 5-120 MG per tablet Take 1 tablet by mouth as needed.       . meclizine (ANTIVERT) 25 MG tablet Take 1 tablet (25 mg total) by mouth 3 (three) times daily as needed.  30 tablet  3  . metFORMIN (GLUCOPHAGE) 1000 MG tablet TAKE ONE TABLET BY MOUTH TWICE DAILY WITH MEALS  180 tablet  1  . pantoprazole (PROTONIX) 40 MG tablet Take 1 tablet (40 mg total) by mouth daily.  30 tablet  5  . pioglitazone (ACTOS) 45 MG tablet TAKE ONE TABLET BY MOUTH ONCE DAILY  90 tablet  0  . Polyethyl Glycol-Propyl Glycol (SYSTANE OP) Apply to eye. As directed for dry eyes       . Wheat Dextrin (BENEFIBER PO) Take 3 capsules by mouth 2 (two) times daily.       No current facility-administered medications on file prior to visit.    BP 110/66  Pulse 63  Temp(Src) 98.1 F (36.7 C) (Oral)  Resp 20  Ht 4' 11.5" (1.511 m)  Wt 184 lb  (83.462 kg)  BMI 36.56 kg/m2  SpO2 98%     Review of Systems  Constitutional: Negative.   HENT: Negative for congestion, dental problem, hearing loss, rhinorrhea, sinus pressure, sore throat and tinnitus.   Eyes: Negative for pain, discharge and visual disturbance.  Respiratory: Negative for cough and shortness of breath.   Cardiovascular: Positive for chest pain. Negative for palpitations and leg swelling.  Gastrointestinal: Negative for nausea, vomiting, abdominal pain, diarrhea, constipation, blood in stool and abdominal distention.  Genitourinary: Negative for dysuria, urgency, frequency, hematuria, flank pain, vaginal bleeding, vaginal discharge, difficulty urinating, vaginal pain and pelvic pain.  Musculoskeletal: Negative for arthralgias, gait problem and joint swelling.  Skin: Negative for rash.  Neurological: Negative for dizziness, syncope, speech difficulty, weakness, numbness and headaches.  Hematological: Negative for adenopathy.  Psychiatric/Behavioral: Negative for behavioral problems, dysphoric mood and agitation. The patient is not nervous/anxious.        Objective:   Physical Exam  Constitutional: She is oriented to person, place, and time. She appears well-developed and well-nourished.  HENT:  Head: Normocephalic.  Right Ear: External ear normal.  Left Ear: External ear normal.  Mouth/Throat: Oropharynx is clear and moist.  Eyes: Conjunctivae and EOM are normal. Pupils are equal, round, and reactive to light.  Neck: Normal range of motion. Neck supple. No thyromegaly present.  Cardiovascular: Normal rate, regular rhythm, normal heart sounds and intact distal pulses.   Pulmonary/Chest: Effort normal and breath sounds normal. No respiratory distress. She has no wheezes. She has no rales.  Right lateral chest wall tenderness to palpation  Abdominal: Soft. Bowel sounds are normal. She exhibits no mass. There is no tenderness.  Musculoskeletal: Normal range of  motion.  Lymphadenopathy:    She has no cervical adenopathy.  Neurological: She is alert and oriented to person, place, and time.  Skin: Skin is warm and dry. No rash noted.  Psychiatric: She has a normal mood and affect. Her behavior is normal.          Assessment & Plan:   Chest wall pain Diabetes mellitus well controlled.  We'll recheck hemoglobin A1c in 3 months Hypertension excellent control Osteoarthritis  We'll treat with short term, Mobic

## 2013-11-21 NOTE — Progress Notes (Signed)
Pre visit review using our clinic review tool, if applicable. No additional management support is needed unless otherwise documented below in the visit note. 

## 2013-11-30 ENCOUNTER — Ambulatory Visit: Payer: Medicare Other | Admitting: Internal Medicine

## 2013-12-07 ENCOUNTER — Other Ambulatory Visit: Payer: Self-pay | Admitting: Internal Medicine

## 2013-12-11 ENCOUNTER — Other Ambulatory Visit: Payer: Self-pay | Admitting: Internal Medicine

## 2014-02-11 LAB — HM DIABETES EYE EXAM

## 2014-02-13 ENCOUNTER — Encounter: Payer: Self-pay | Admitting: Internal Medicine

## 2014-03-11 ENCOUNTER — Encounter: Payer: Self-pay | Admitting: Internal Medicine

## 2014-03-18 ENCOUNTER — Telehealth: Payer: Self-pay | Admitting: Internal Medicine

## 2014-03-18 MED ORDER — PIOGLITAZONE HCL 45 MG PO TABS: 45.0000 mg | ORAL_TABLET | Freq: Every day | ORAL | Status: DC

## 2014-03-18 MED ORDER — ATORVASTATIN CALCIUM 40 MG PO TABS: 40.0000 mg | ORAL_TABLET | Freq: Every day | ORAL | Status: DC

## 2014-03-18 NOTE — Telephone Encounter (Signed)
Pt request refill  pioglitazone (ACTOS) 45 MG tablet atorvastatin (LIPITOR) 40 MG tablet [facility]  Pt has appt 04/05/14

## 2014-03-18 NOTE — Telephone Encounter (Signed)
Pt notified Rx's sent to pharmacy as requested. 

## 2014-04-05 ENCOUNTER — Ambulatory Visit (INDEPENDENT_AMBULATORY_CARE_PROVIDER_SITE_OTHER): Payer: Medicare Other | Admitting: Internal Medicine

## 2014-04-05 ENCOUNTER — Encounter: Payer: Self-pay | Admitting: Internal Medicine

## 2014-04-05 VITALS — BP 140/72 | HR 63 | Temp 97.8°F | Resp 20 | Ht 59.5 in | Wt 190.0 lb

## 2014-04-05 DIAGNOSIS — E119 Type 2 diabetes mellitus without complications: Secondary | ICD-10-CM | POA: Diagnosis not present

## 2014-04-05 DIAGNOSIS — I10 Essential (primary) hypertension: Secondary | ICD-10-CM

## 2014-04-05 DIAGNOSIS — E785 Hyperlipidemia, unspecified: Secondary | ICD-10-CM

## 2014-04-05 LAB — HEMOGLOBIN A1C: Hgb A1c MFr Bld: 6.7 % — ABNORMAL HIGH (ref 4.6–6.5)

## 2014-04-05 MED ORDER — GLUCOSE BLOOD VI STRP: 1.0000 | ORAL_STRIP | Freq: Every day | Status: DC

## 2014-04-05 NOTE — Progress Notes (Signed)
Subjective:    Patient ID: Briana Patton, female    DOB: 1940/10/20, 74 y.o.   MRN: [id number]  HPI 74 year old patient who has type 2 diabetes.  Hemoglobin A1c's have been very well controlled on triple oral therapy.  She has dyslipidemia and essential hypertension.  She has done reasonably well. Is having some mild the left lower medial leg pain related to some varicosities.  She has a history of gastro-soft, reflux disease and Barrett's esophagus.  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 No cardiopulmonary complaints.  She is scheduled for cataract extraction surgery soon  Past Medical History  Diagnosis Date  . Diabetes mellitus   . Hyperlipidemia   . Hypertension   . Diverticulosis   . Osteoarthritis   . GERD (gastroesophageal reflux disease)   . Diverticulosis of colon (without mention of hemorrhage)   . Internal hemorrhoids without mention of complication   . Barrett esophagus 12/12/2000    egd bx    History   Social History  . Marital Status: Married    Spouse Name: N/A    Number of Children: N/A  . Years of Education: N/A   Occupational History  . Not on file.   Social History Main Topics  . Smoking status: Former Smoker    Quit date: 03/08/1998  . Smokeless tobacco: Never Used  . Alcohol Use: No  . Drug Use: No  . Sexual Activity: Not on file   Other Topics Concern  . Not on file   Social History Narrative   Father age 61 complications of cardiac and cerebrovascular disease          Past Surgical History  Procedure Laterality Date  . Abdominal hysterectomy    . Cesarean section    . Lumbar laminectomy    . Knee arthroscopy      left knee surgery 1999  . Colonoscopy    . Upper gastrointestinal endoscopy      Family History  Problem Relation Age of Onset  . Diabetes Brother   . Diabetes Mother   . Diabetes Father   . Alcohol abuse Sister     X58  . Alcohol abuse Brother   . Colon cancer Neg Hx   . Esophageal cancer Neg Hx   . Rectal cancer  Neg Hx   . Stomach cancer Neg Hx     Allergies  Allergen Reactions  . Penicillins     REACTION: unspecified    Current Outpatient Prescriptions on File Prior to Visit  Medication Sig Dispense Refill  . aspirin 81 MG tablet Take 1 tablet (81 mg total) by mouth daily. 30 tablet   . atorvastatin (LIPITOR) 40 MG tablet Take 1 tablet (40 mg total) by mouth daily. 90 tablet 1  . benazepril-hydrochlorthiazide (LOTENSIN HCT) 20-12.5 MG per tablet TAKE ONE TABLET BY MOUTH ONCE DAILY 90 tablet 1  . Blood Glucose Monitoring Suppl (FREESTYLE FREEDOM LITE) W/DEVICE KIT As directed , dx code 250.00 1 each 0  . fluticasone (FLONASE) 50 MCG/ACT nasal spray Place 2 sprays into both nostrils daily. 16 g 0  . glimepiride (AMARYL) 4 MG tablet Take 4 mg by mouth daily before breakfast.    . glucose blood (FREESTYLE LITE) test strip 1 each by Other route daily. Use as instructed 100 each 3  . metFORMIN (GLUCOPHAGE) 1000 MG tablet TAKE ONE TABLET BY MOUTH TWICE DAILY WITH MEALS 180 tablet 1  . pantoprazole (PROTONIX) 40 MG tablet Take 1 tablet (40 mg total) by mouth  daily. 30 tablet 5  . pioglitazone (ACTOS) 45 MG tablet Take 1 tablet (45 mg total) by mouth daily. 90 tablet 1  . Polyethyl Glycol-Propyl Glycol (SYSTANE OP) Apply to eye. As directed for dry eyes     . Wheat Dextrin (BENEFIBER PO) Take 3 capsules by mouth 2 (two) times daily.    Marland Kitchen albuterol (PROVENTIL HFA;VENTOLIN HFA) 108 (90 BASE) MCG/ACT inhaler Inhale 2 puffs into the lungs every 6 (six) hours as needed for wheezing or shortness of breath. (Patient not taking: Reported on 04/05/2014) 1 Inhaler 0  . meloxicam (MOBIC) 7.5 MG tablet TAKE ONE TABLET BY MOUTH ONCE DAILY (Patient not taking: Reported on 04/05/2014) 30 tablet 0   No current facility-administered medications on file prior to visit.    BP 140/72 mmHg  Pulse 63  Temp(Src) 97.8 F (36.6 C) (Oral)  Resp 20  Ht 4' 11.5" (1.511 m)  Wt 190 lb (86.183 kg)  BMI 37.75 kg/m2  SpO2  98%      Review of Systems  Constitutional: Negative.   HENT: Negative for congestion, dental problem, hearing loss, rhinorrhea, sinus pressure, sore throat and tinnitus.   Eyes: Negative for pain, discharge and visual disturbance.  Respiratory: Negative for cough and shortness of breath.   Cardiovascular: Negative for chest pain, palpitations and leg swelling.  Gastrointestinal: Negative for nausea, vomiting, abdominal pain, diarrhea, constipation, blood in stool and abdominal distention.  Genitourinary: Negative for dysuria, urgency, frequency, hematuria, flank pain, vaginal bleeding, vaginal discharge, difficulty urinating, vaginal pain and pelvic pain.  Musculoskeletal: Negative for joint swelling, arthralgias and gait problem.  Skin: Negative for rash.  Neurological: Negative for dizziness, syncope, speech difficulty, weakness, numbness and headaches.  Hematological: Negative for adenopathy.  Psychiatric/Behavioral: Negative for behavioral problems, dysphoric mood and agitation. The patient is not nervous/anxious.        Objective:   Physical Exam  Constitutional: She is oriented to person, place, and time. She appears well-developed and well-nourished.  HENT:  Head: Normocephalic.  Right Ear: External ear normal.  Left Ear: External ear normal.  Mouth/Throat: Oropharynx is clear and moist.  Eyes: Conjunctivae and EOM are normal. Pupils are equal, round, and reactive to light.  Neck: Normal range of motion. Neck supple. No thyromegaly present.  Cardiovascular: Normal rate, regular rhythm, normal heart sounds and intact distal pulses.   Pedal pulses full except for diminished right dorsalis pedis pulse  Pulmonary/Chest: Effort normal and breath sounds normal.  Abdominal: Soft. Bowel sounds are normal. She exhibits no mass. There is no tenderness.  Musculoskeletal: Normal range of motion.  Lymphadenopathy:    She has no cervical adenopathy.  Neurological: She is alert and  oriented to person, place, and time.  Skin: Skin is warm and dry. No rash noted.  Tender varicosity, left upper medial calf  Psychiatric: She has a normal mood and affect. Her behavior is normal.          Assessment & Plan:   Diabetes mellitus.  Will check a hemoglobin A1c Essential hypertension, stable Dyslipidemia.  Continue statin therapy GERD.  Continue PPI therapy  CPX in 3 months

## 2014-04-05 NOTE — Patient Instructions (Signed)
Please check your hemoglobin A1c every 3 months  Limit your sodium (Salt) intake    It is important that you exercise regularly, at least 20 minutes 3 to 4 times per week.  If you develop chest pain or shortness of breath seek  medical attention.   

## 2014-04-05 NOTE — Progress Notes (Signed)
Pre visit review using our clinic review tool, if applicable. No additional management support is needed unless otherwise documented below in the visit note. 

## 2014-04-09 ENCOUNTER — Other Ambulatory Visit: Payer: Self-pay

## 2014-04-09 MED ORDER — GLUCOSE BLOOD VI STRP: ORAL_STRIP | Status: DC

## 2014-04-09 MED ORDER — FREESTYLE LANCETS MISC: Status: DC

## 2014-04-09 NOTE — Telephone Encounter (Signed)
Received a fax from [facility] stating that "all medicare claims must have ICD 10 code and specific signature.  Please send new script."   Rx resent for strips and lancets.

## 2014-05-03 ENCOUNTER — Encounter: Payer: Self-pay | Admitting: Internal Medicine

## 2014-05-03 DIAGNOSIS — H25012 Cortical age-related cataract, left eye: Secondary | ICD-10-CM | POA: Diagnosis not present

## 2014-05-03 DIAGNOSIS — H524 Presbyopia: Secondary | ICD-10-CM | POA: Diagnosis not present

## 2014-05-03 DIAGNOSIS — H25011 Cortical age-related cataract, right eye: Secondary | ICD-10-CM | POA: Diagnosis not present

## 2014-05-03 DIAGNOSIS — H04123 Dry eye syndrome of bilateral lacrimal glands: Secondary | ICD-10-CM | POA: Diagnosis not present

## 2014-05-03 DIAGNOSIS — H35033 Hypertensive retinopathy, bilateral: Secondary | ICD-10-CM | POA: Diagnosis not present

## 2014-05-03 LAB — HM DIABETES EYE EXAM

## 2014-05-23 ENCOUNTER — Encounter: Payer: Self-pay | Admitting: Internal Medicine

## 2014-06-07 ENCOUNTER — Encounter: Payer: Self-pay | Admitting: Internal Medicine

## 2014-06-07 ENCOUNTER — Ambulatory Visit (INDEPENDENT_AMBULATORY_CARE_PROVIDER_SITE_OTHER): Payer: Medicare Other | Admitting: Internal Medicine

## 2014-06-07 VITALS — BP 140/70 | HR 62 | Temp 98.2°F | Resp 20 | Ht 59.25 in | Wt 188.0 lb

## 2014-06-07 DIAGNOSIS — E119 Type 2 diabetes mellitus without complications: Secondary | ICD-10-CM

## 2014-06-07 DIAGNOSIS — I10 Essential (primary) hypertension: Secondary | ICD-10-CM

## 2014-06-07 DIAGNOSIS — Z0000 Encounter for general adult medical examination without abnormal findings: Secondary | ICD-10-CM

## 2014-06-07 DIAGNOSIS — K2270 Barrett's esophagus without dysplasia: Secondary | ICD-10-CM | POA: Diagnosis not present

## 2014-06-07 DIAGNOSIS — E785 Hyperlipidemia, unspecified: Secondary | ICD-10-CM

## 2014-06-07 DIAGNOSIS — M150 Primary generalized (osteo)arthritis: Secondary | ICD-10-CM

## 2014-06-07 DIAGNOSIS — M159 Polyosteoarthritis, unspecified: Secondary | ICD-10-CM

## 2014-06-07 LAB — CBC WITH DIFFERENTIAL/PLATELET
BASOS ABS: 0 10*3/uL (ref 0.0–0.1)
Basophils Relative: 0.5 % (ref 0.0–3.0)
EOS ABS: 0.1 10*3/uL (ref 0.0–0.7)
Eosinophils Relative: 2.1 % (ref 0.0–5.0)
HCT: 32.2 % — ABNORMAL LOW (ref 36.0–46.0)
Hemoglobin: 10.7 g/dL — ABNORMAL LOW (ref 12.0–15.0)
Lymphocytes Relative: 39.5 % (ref 12.0–46.0)
Lymphs Abs: 2.5 10*3/uL (ref 0.7–4.0)
MCHC: 33.2 g/dL (ref 30.0–36.0)
MCV: 89.3 fl (ref 78.0–100.0)
MONOS PCT: 7.2 % (ref 3.0–12.0)
Monocytes Absolute: 0.5 10*3/uL (ref 0.1–1.0)
NEUTROS PCT: 50.7 % (ref 43.0–77.0)
Neutro Abs: 3.2 10*3/uL (ref 1.4–7.7)
Platelets: 220 10*3/uL (ref 150.0–400.0)
RBC: 3.61 Mil/uL — AB (ref 3.87–5.11)
RDW: 15.2 % (ref 11.5–15.5)
WBC: 6.4 10*3/uL (ref 4.0–10.5)

## 2014-06-07 LAB — COMPREHENSIVE METABOLIC PANEL
ALBUMIN: 3.8 g/dL (ref 3.5–5.2)
ALK PHOS: 53 U/L (ref 39–117)
ALT: 13 U/L (ref 0–35)
AST: 17 U/L (ref 0–37)
BUN: 21 mg/dL (ref 6–23)
CHLORIDE: 104 meq/L (ref 96–112)
CO2: 31 meq/L (ref 19–32)
CREATININE: 0.86 mg/dL (ref 0.40–1.20)
Calcium: 9.3 mg/dL (ref 8.4–10.5)
GFR: 68.84 mL/min (ref >60.00)
Glucose, Bld: 119 mg/dL — ABNORMAL HIGH (ref 70–99)
Potassium: 4.8 mEq/L (ref 3.5–5.1)
Sodium: 137 mEq/L (ref 135–145)
Total Bilirubin: 0.4 mg/dL (ref 0.2–1.2)
Total Protein: 7 g/dL (ref 6.0–8.3)

## 2014-06-07 LAB — TSH: TSH: 4.08 u[IU]/mL (ref 0.35–4.50)

## 2014-06-07 LAB — MICROALBUMIN / CREATININE URINE RATIO
Creatinine,U: 53.2 mg/dL
Microalb Creat Ratio: 1.3 mg/g (ref 0.0–30.0)

## 2014-06-07 LAB — LIPID PANEL
Cholesterol: 150 mg/dL (ref 0–200)
HDL: 57.6 mg/dL (ref >39.00)
LDL CALC: 71 mg/dL (ref 0–99)
NonHDL: 92.4
Total CHOL/HDL Ratio: 3
Triglycerides: 107 mg/dL (ref 0.0–149.0)
VLDL: 21.4 mg/dL (ref 0.0–40.0)

## 2014-06-07 NOTE — Progress Notes (Signed)
Subjective:    Patient ID: Briana Patton, female    DOB: 04-30-1940, 74 y.o.   MRN: [id number]  HPI  Patient ID: Briana Patton, female   DOB: Jul 20, 1940, 74 y.o.   MRN: [id number]  Subjective:    Patient ID: Briana Patton, female    DOB: Sep 26, 1940, 74 y.o.   MRN: [id number]  HPI   History of Present Illness:   88  -year-old patient seen today for a wellness exam.   Medical problems include type 2 diabetes, hypertension, and dyslipidemia. Her last hemoglobin A1c was 6.7. There is been some very modest weight loss. She has osteoarthritis, gastroesophageal reflux disease.   Wt Readings from Last 3 Encounters:  06/07/14 188 lb (85.276 kg)  04/05/14 190 lb (86.183 kg)  11/21/13 184 lb (83.462 kg)    Here for Medicare AWV:   1. Risk factors based on Past M, S, F history:cardiovascular risk factors include diabetes, hypertension, dyslipidemia  2. Physical Activities: no activity restrictions, but no regular exercise program. 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 Depression/mood: no history depression, or mood disorder  4. Hearing: no hearing deficits  5. ADL's: independent in all aspects of the daily living  6. Fall Risk: low  7. Home Safety: No problems identified  8. Height, weight, &visual acuity:height and weight stable. No difficulty with visual acuity .  Recent eye examination last month 9. Counseling: follow-up eye examination and encouraged annually 10. Labs ordered based on risk factors: laboratory profile, including lipid panel, and hemoglobin A1c will be reviewed  11. Referral Coordination- ophthalmology referral  12. Care Plan- heart healthy diet, exercise and weight loss. All encouraged  13. Cognitive Assessment- alert and oriented normal affect. No difficulty with short-term memory. Able to handle all executive functioning without difficulty  14.  Preventive services will include annual health assessments and eye examinations.   She will continue 10 year colonoscopies.  Hemoglobin A1c's of be tracked quarterly. Patient was provided with a written and personalized care plan 15.  Provider list includes primary care ophthalmology radiology and gastroenterology  Preventive Screening-Counseling & Management  Alcohol-Tobacco  Smoking Status: quit   Current Problems (verified):   1) Vertigo, Peripheral (ICD-386.10)  2) Gerd (ICD-530.81)  3) Palpitations, Occasional (ICD-785.1)  4) Osteoarthritis (ICD-715.90)  5) Uri (ICD-465.9)  6) Diverticulosis, Colon (ICD-562.10)  7) Arthroscopy, Left Knee, Hx of (ICD-V45.89)  8) Hypertension (ICD-401.9)  9) Hyperlipidemia (ICD-272.4)  10) Diabetes Mellitus, Type II (ICD-250.00)  11. Barrett's esophagus  EGD 4/13  Current Medications (verified):  1) Amaryl 4 Mg Tabs (Glimepiride) .Marland Kitchen.. 1 Tablet Two Times A Day  2) Protonix 40 Mg Tbec (Pantoprazole Sodium) .... Take 1 Tablet By Mouth Twice A Day  3) Levsin 0.125 Mg/46ml Elix (Hyoscyamine Sulfate) .Marland Kitchen.. 1 Q6h As Needed  4) Loratadine Allergy Relief D-12 5-120 Mg Tb12 (Loratadine-Pseudoephedrine) .... One Twice Daily  5) Freestyle Test Strp (Glucose Blood) .... Use Two Times A Day  6) Benazepril-Hydrochlorothiazide 20-12.5 Mg Tabs (Benazepril-Hydrochlorothiazide) .Marland Kitchen.. 1 Once Daily  7) Pravastatin Sodium 40 Mg Tabs (Pravastatin Sodium) .... One Daily  8) Metformin Hcl 1000 Mg Tabs (Metformin Hcl) .Marland Kitchen.. 1 Two Times A Day  9) Proair Hfa 108 (90 Base) Mcg/act Aers (Albuterol Sulfate) .... 2 Puffs Q 4 Hours For Wheezing   Allergies (verified):  1) Penicillin G Potassium (Penicillin G Potassium)   Past History:  Past Medical History:  Reviewed history from 06/25/2008 and no changes required.  Diabetes mellitus, type  II  Hyperlipidemia  Hypertension  Diverticulosis, colon  Osteoarthritis  GERD   Past Surgical History:    Hysterectomy left salpingo-oophorectomy 1980  C-section x 3  Lumbar laminectomy 1989  arthroscopic left  knee surgery 1999  colonoscopy 2002  4/13  Family History:    father died age 26, complications of cardiac and cerebrovascular disease  mother, age 18  Brother died of complications of DM   Social History:  Reviewed history from 02/27/2008 and no changes required.  Married  Never Smoked     Past Medical History  Diagnosis Date  . Diabetes mellitus   . Hyperlipidemia   . Hypertension   . Diverticulosis   . Osteoarthritis   . GERD (gastroesophageal reflux disease)   . Diverticulosis of colon (without mention of hemorrhage)   . Internal hemorrhoids without mention of complication   . Barrett esophagus 12/12/2000    egd bx    History   Social History  . Marital Status: Married    Spouse Name: N/A  . Number of Children: N/A  . Years of Education: N/A   Occupational History  . Not on file.   Social History Main Topics  . Smoking status: Former Smoker    Quit date: 03/08/1998  . Smokeless tobacco: Never Used  . Alcohol Use: No  . Drug Use: No  . Sexual Activity: Not on file   Other Topics Concern  . Not on file   Social History Narrative   Father age 24 complications of cardiac and cerebrovascular disease          Past Surgical History  Procedure Laterality Date  . Abdominal hysterectomy    . Cesarean section    . Lumbar laminectomy    . Knee arthroscopy      left knee surgery 1999  . Colonoscopy    . Upper gastrointestinal endoscopy      Family History  Problem Relation Age of Onset  . Diabetes Brother   . Diabetes Mother   . Diabetes Father   . Alcohol abuse Sister     X75  . Alcohol abuse Brother   . Colon cancer Neg Hx   . Esophageal cancer Neg Hx   . Rectal cancer Neg Hx   . Stomach cancer Neg Hx     Allergies  Allergen Reactions  . Penicillins     REACTION: unspecified    Current Outpatient Prescriptions on File Prior to Visit  Medication Sig Dispense Refill  . albuterol (PROVENTIL HFA;VENTOLIN HFA) 108 (90 BASE) MCG/ACT inhaler  Inhale 2 puffs into the lungs every 6 (six) hours as needed for wheezing or shortness of breath. 1 Inhaler 0  . aspirin 81 MG tablet Take 1 tablet (81 mg total) by mouth daily. 30 tablet   . atorvastatin (LIPITOR) 40 MG tablet Take 1 tablet (40 mg total) by mouth daily. 90 tablet 1  . benazepril-hydrochlorthiazide (LOTENSIN HCT) 20-12.5 MG per tablet TAKE ONE TABLET BY MOUTH ONCE DAILY 90 tablet 1  . Blood Glucose Monitoring Suppl (FREESTYLE FREEDOM LITE) W/DEVICE KIT As directed , dx code 250.00 1 each 0  . fluticasone (FLONASE) 50 MCG/ACT nasal spray Place 2 sprays into both nostrils daily. 16 g 0  . glimepiride (AMARYL) 4 MG tablet Take 4 mg by mouth daily before breakfast.    . glucose blood (FREESTYLE LITE) test strip Test once daily. 100 each 3  . Lancets (FREESTYLE) lancets Test once daily. 100 each 5  . meloxicam (MOBIC) 7.5 MG tablet  TAKE ONE TABLET BY MOUTH ONCE DAILY 30 tablet 0  . metFORMIN (GLUCOPHAGE) 1000 MG tablet TAKE ONE TABLET BY MOUTH TWICE DAILY WITH MEALS 180 tablet 1  . pantoprazole (PROTONIX) 40 MG tablet Take 1 tablet (40 mg total) by mouth daily. 30 tablet 5  . pioglitazone (ACTOS) 45 MG tablet Take 1 tablet (45 mg total) by mouth daily. 90 tablet 1  . Polyethyl Glycol-Propyl Glycol (SYSTANE OP) Apply to eye. As directed for dry eyes     . Wheat Dextrin (BENEFIBER PO) Take 3 capsules by mouth 2 (two) times daily.     No current facility-administered medications on file prior to visit.    BP 140/70 mmHg  Pulse 62  Temp(Src) 98.2 F (36.8 C) (Oral)  Resp 20  Ht 4' 11.25" (1.505 m)  Wt 188 lb (85.276 kg)  BMI 37.65 kg/m2  SpO2 98%    Review of Systems  Constitutional: Negative for fever, appetite change, fatigue and unexpected weight change.  HENT: Negative for congestion, dental problem, ear pain, hearing loss, mouth sores, nosebleeds, sinus pressure, sore throat, tinnitus, trouble swallowing and voice change.   Eyes: Negative for photophobia, pain, redness  and visual disturbance.  Respiratory: Negative for cough, chest tightness and shortness of breath.   Cardiovascular: Negative for chest pain, palpitations and leg swelling.  Gastrointestinal: Negative for nausea, vomiting, abdominal pain, diarrhea, constipation, blood in stool, abdominal distention and rectal pain.  Genitourinary: Negative for dysuria, urgency, frequency, hematuria, flank pain, vaginal bleeding, vaginal discharge, difficulty urinating, genital sores, vaginal pain, menstrual problem and pelvic pain.  Musculoskeletal: Negative for arthralgias, back pain and neck stiffness.  Skin: Negative for rash.  Neurological: Negative for dizziness, syncope, speech difficulty, weakness, light-headedness, numbness and headaches.  Hematological: Negative for adenopathy. Does not bruise/bleed easily.  Psychiatric/Behavioral: Negative for suicidal ideas, behavioral problems, self-injury, dysphoric mood and agitation. The patient is not nervous/anxious.        Objective:   Physical Exam  Constitutional: She is oriented to person, place, and time. She appears well-developed and well-nourished.  HENT:  Head: Normocephalic and atraumatic.  Right Ear: External ear normal.  Left Ear: External ear normal.  Mouth/Throat: Oropharynx is clear and moist.  Eyes: Conjunctivae and EOM are normal.  Neck: Normal range of motion. Neck supple. No JVD present. No thyromegaly present.  Cardiovascular: Normal rate, regular rhythm, normal heart sounds and intact distal pulses.   No murmur heard. Decreased right pedal pulses   Pulmonary/Chest: Effort normal and breath sounds normal. She has no wheezes. She has no rales.  Abdominal: Soft. Bowel sounds are normal. She exhibits no distension and no mass. There is no tenderness. There is no rebound and no guarding.  Lower midline scar  Musculoskeletal: Normal range of motion. She exhibits no edema and no tenderness.  Neurological: She is alert and oriented to  person, place, and time. She has normal reflexes. No cranial nerve deficit. She exhibits normal muscle tone. Coordination normal.  Skin: Skin is warm and dry. No rash noted.  Psychiatric: She has a normal mood and affect. Her behavior is normal.          Assessment & Plan:   Preventive health exam. Diabetes mellitus. The patient is on maximal dual therapy. Hypertension well controlled Dyslipidemia lipid profile will be reviewed History of Barrett's esophagus  Recheck 3 months All meds refilled   Review of Systems     Objective:   Physical Exam  Eyes:  Slight cataract, left eye  Assessment & Plan:

## 2014-06-07 NOTE — Patient Instructions (Signed)
Limit your sodium (Salt) intake    It is important that you exercise regularly, at least 20 minutes 3 to 4 times per week.  If you develop chest pain or shortness of breath seek  medical attention.  You need to lose weight.  Consider a lower calorie diet and regular exercise.  Return in 3 months for follow-up  Health Maintenance Adopting a healthy lifestyle and getting preventive care can go a long way to promote health and wellness. Talk with your health care provider about what schedule of regular examinations is right for you. This is a good chance for you to check in with your provider about disease prevention and staying healthy. In between checkups, there are plenty of things you can do on your own. Experts have done a lot of research about which lifestyle changes and preventive measures are most likely to keep you healthy. Ask your health care provider for more information. WEIGHT AND DIET  Eat a healthy diet  Be sure to include plenty of vegetables, fruits, low-fat dairy products, and lean protein.  Do not eat a lot of foods high in solid fats, added sugars, or salt.  Get regular exercise. This is one of the most important things you can do for your health.  Most adults should exercise for at least 150 minutes each week. The exercise should increase your heart rate and make you sweat (moderate-intensity exercise).  Most adults should also do strengthening exercises at least twice a week. This is in addition to the moderate-intensity exercise.  Maintain a healthy weight  Body mass index (BMI) is a measurement that can be used to identify possible weight problems. It estimates body fat based on height and weight. Your health care provider can help determine your BMI and help you achieve or maintain a healthy weight.  For females 73 years of age and older:   A BMI below 18.5 is considered underweight.  A BMI of 18.5 to 24.9 is normal.  A BMI of 25 to 29.9 is considered  overweight.  A BMI of 30 and above is considered obese.  Watch levels of cholesterol and blood lipids  You should start having your blood tested for lipids and cholesterol at 73 years of age, then have this test every 5 years.  You may need to have your cholesterol levels checked more often if:  Your lipid or cholesterol levels are high.  You are older than 73 years of age.  You are at high risk for heart disease.  CANCER SCREENING   Lung Cancer  Lung cancer screening is recommended for adults 73-34 years old who are at high risk for lung cancer because of a history of smoking.  A yearly low-dose CT scan of the lungs is recommended for people who:  Currently smoke.  Have quit within the past 15 years.  Have at least a 30-pack-year history of smoking. A pack year is smoking an average of one pack of cigarettes a day for 1 year.  Yearly screening should continue until it has been 15 years since you quit.  Yearly screening should stop if you develop a health problem that would prevent you from having lung cancer treatment.  Breast Cancer  Practice breast self-awareness. This means understanding how your breasts normally appear and feel.  It also means doing regular breast self-exams. Let your health care provider know about any changes, no matter how small.  If you are in your 20s or 30s, you should have a clinical  breast exam (CBE) by a health care provider every 1-3 years as part of a regular health exam.  If you are 73 or older, have a CBE every year. Also consider having a breast X-ray (mammogram) every year.  If you have a family history of breast cancer, talk to your health care provider about genetic screening.  If you are at high risk for breast cancer, talk to your health care provider about having an MRI and a mammogram every year.  Breast cancer gene (BRCA) assessment is recommended for women who have family members with BRCA-related cancers. BRCA-related  cancers include:  Breast.  Ovarian.  Tubal.  Peritoneal cancers.  Results of the assessment will determine the need for genetic counseling and BRCA1 and BRCA2 testing. Cervical Cancer Routine pelvic examinations to screen for cervical cancer are no longer recommended for nonpregnant women who are considered low risk for cancer of the pelvic organs (ovaries, uterus, and vagina) and who do not have symptoms. A pelvic examination may be necessary if you have symptoms including those associated with pelvic infections. Ask your health care provider if a screening pelvic exam is right for you.   The Pap test is the screening test for cervical cancer for women who are considered at risk.  If you had a hysterectomy for a problem that was not cancer or a condition that could lead to cancer, then you no longer need Pap tests.  If you are older than 73 years, and you have had normal Pap tests for the past 10 years, you no longer need to have Pap tests.  If you have had past treatment for cervical cancer or a condition that could lead to cancer, you need Pap tests and screening for cancer for at least 20 years after your treatment.  If you no longer get a Pap test, assess your risk factors if they change (such as having a new sexual partner). This can affect whether you should start being screened again.  Some women have medical problems that increase their chance of getting cervical cancer. If this is the case for you, your health care provider may recommend more frequent screening and Pap tests.  The human papillomavirus (HPV) test is another test that may be used for cervical cancer screening. The HPV test looks for the virus that can cause cell changes in the cervix. The cells collected during the Pap test can be tested for HPV.  The HPV test can be used to screen women 73 years of age and older. Getting tested for HPV can extend the interval between normal Pap tests from three to five  years.  An HPV test also should be used to screen women of any age who have unclear Pap test results.  After 73 years of age, women should have HPV testing as often as Pap tests.  Colorectal Cancer  This type of cancer can be detected and often prevented.  Routine colorectal cancer screening usually begins at 73 years of age and continues through 73 years of age.  Your health care provider may recommend screening at an earlier age if you have risk factors for colon cancer.  Your health care provider may also recommend using home test kits to check for hidden blood in the stool.  A small camera at the end of a tube can be used to examine your colon directly (sigmoidoscopy or colonoscopy). This is done to check for the earliest forms of colorectal cancer.  Routine screening usually begins at age  50.  Direct examination of the colon should be repeated every 5-10 years through 73 years of age. However, you may need to be screened more often if early forms of precancerous polyps or small growths are found. Skin Cancer  Check your skin from head to toe regularly.  Tell your health care provider about any new moles or changes in moles, especially if there is a change in a mole's shape or color.  Also tell your health care provider if you have a mole that is larger than the size of a pencil eraser.  Always use sunscreen. Apply sunscreen liberally and repeatedly throughout the day.  Protect yourself by wearing long sleeves, pants, a wide-brimmed hat, and sunglasses whenever you are outside. HEART DISEASE, DIABETES, AND HIGH BLOOD PRESSURE   Have your blood pressure checked at least every 1-2 years. High blood pressure causes heart disease and increases the risk of stroke.  If you are between 29 years and 50 years old, ask your health care provider if you should take aspirin to prevent strokes.  Have regular diabetes screenings. This involves taking a blood sample to check your fasting  blood sugar level.  If you are at a normal weight and have a low risk for diabetes, have this test once every three years after 73 years of age.  If you are overweight and have a high risk for diabetes, consider being tested at a younger age or more often. PREVENTING INFECTION  Hepatitis B  If you have a higher risk for hepatitis B, you should be screened for this virus. You are considered at high risk for hepatitis B if:  You were born in a country where hepatitis B is common. Ask your health care provider which countries are considered high risk.  Your parents were born in a high-risk country, and you have not been immunized against hepatitis B (hepatitis B vaccine).  You have HIV or AIDS.  You use needles to inject street drugs.  You live with someone who has hepatitis B.  You have had sex with someone who has hepatitis B.  You get hemodialysis treatment.  You take certain medicines for conditions, including cancer, organ transplantation, and autoimmune conditions. Hepatitis C  Blood testing is recommended for:  Everyone born from [birth year] through 1964.  Anyone with known risk factors for hepatitis C. Sexually transmitted infections (STIs)  You should be screened for sexually transmitted infections (STIs) including gonorrhea and chlamydia if:  You are sexually active and are younger than 73 years of age.  You are older than 73 years of age and your health care provider tells you that you are at risk for this type of infection.  Your sexual activity has changed since you were last screened and you are at an increased risk for chlamydia or gonorrhea. Ask your health care provider if you are at risk.  If you do not have HIV, but are at risk, it may be recommended that you take a prescription medicine daily to prevent HIV infection. This is called pre-exposure prophylaxis (PrEP). You are considered at risk if:  You are sexually active and do not regularly use condoms or know  the HIV status of your partner(s).  You take drugs by injection.  You are sexually active with a partner who has HIV. Talk with your health care provider about whether you are at high risk of being infected with HIV. If you choose to begin PrEP, you should first be tested for HIV. You should  then be tested every 3 months for as long as you are taking PrEP.  PREGNANCY   If you are premenopausal and you may become pregnant, ask your health care provider about preconception counseling.  If you may become pregnant, take 400 to 800 micrograms (mcg) of folic acid every day.  If you want to prevent pregnancy, talk to your health care provider about birth control (contraception). OSTEOPOROSIS AND MENOPAUSE   Osteoporosis is a disease in which the bones lose minerals and strength with aging. This can result in serious bone fractures. Your risk for osteoporosis can be identified using a bone density scan.  If you are 51 years of age or older, or if you are at risk for osteoporosis and fractures, ask your health care provider if you should be screened.  Ask your health care provider whether you should take a calcium or vitamin D supplement to lower your risk for osteoporosis.  Menopause may have certain physical symptoms and risks.  Hormone replacement therapy may reduce some of these symptoms and risks. Talk to your health care provider about whether hormone replacement therapy is right for you.  HOME CARE INSTRUCTIONS   Schedule regular health, dental, and eye exams.  Stay current with your immunizations.   Do not use any tobacco products including cigarettes, chewing tobacco, or electronic cigarettes.  If you are pregnant, do not drink alcohol.  If you are breastfeeding, limit how much and how often you drink alcohol.  Limit alcohol intake to no more than 1 drink per day for nonpregnant women. One drink equals 12 ounces of beer, 5 ounces of wine, or 1 ounces of hard liquor.  Do not  use street drugs.  Do not share needles.  Ask your health care provider for help if you need support or information about quitting drugs.  Tell your health care provider if you often feel depressed.  Tell your health care provider if you have ever been abused or do not feel safe at home. Document Released: 09/07/2010 Document Revised: 07/09/2013 Document Reviewed: 01/24/2013 [facility] Patient Information 2015 [place], [place]. This information is not intended to replace advice given to you by your health care provider. Make sure you discuss any questions you have with your health care provider.

## 2014-06-07 NOTE — Progress Notes (Signed)
Pre visit review using our clinic review tool, if applicable. No additional management support is needed unless otherwise documented below in the visit note. 

## 2014-06-10 ENCOUNTER — Other Ambulatory Visit: Payer: Self-pay | Admitting: Internal Medicine

## 2014-06-10 MED ORDER — BENAZEPRIL-HYDROCHLOROTHIAZIDE 20-12.5 MG PO TABS: 1.0000 | ORAL_TABLET | Freq: Every day | ORAL | Status: DC

## 2014-06-10 MED ORDER — ATORVASTATIN CALCIUM 40 MG PO TABS: 40.0000 mg | ORAL_TABLET | Freq: Every day | ORAL | Status: DC

## 2014-06-10 MED ORDER — GLIMEPIRIDE 4 MG PO TABS: 4.0000 mg | ORAL_TABLET | Freq: Every day | ORAL | Status: DC

## 2014-06-10 MED ORDER — PIOGLITAZONE HCL 45 MG PO TABS: 45.0000 mg | ORAL_TABLET | Freq: Every day | ORAL | Status: DC

## 2014-06-10 MED ORDER — PANTOPRAZOLE SODIUM 40 MG PO TBEC: 40.0000 mg | DELAYED_RELEASE_TABLET | Freq: Every day | ORAL | Status: DC

## 2014-06-10 NOTE — Telephone Encounter (Signed)
Rx's sent to pharmacy, all 30 day supply with 5 refills due to insurance.

## 2014-08-07 ENCOUNTER — Telehealth: Payer: Self-pay

## 2014-08-07 NOTE — Telephone Encounter (Signed)
Left message on voicemail for pt to return call concerning mammmogram screening.

## 2014-09-04 ENCOUNTER — Ambulatory Visit (INDEPENDENT_AMBULATORY_CARE_PROVIDER_SITE_OTHER): Payer: Medicare Other | Admitting: Internal Medicine

## 2014-09-04 ENCOUNTER — Encounter: Payer: Self-pay | Admitting: Internal Medicine

## 2014-09-04 VITALS — BP 140/76 | HR 65 | Temp 98.2°F | Resp 20 | Ht 59.25 in | Wt 187.0 lb

## 2014-09-04 DIAGNOSIS — I10 Essential (primary) hypertension: Secondary | ICD-10-CM | POA: Diagnosis not present

## 2014-09-04 DIAGNOSIS — K2270 Barrett's esophagus without dysplasia: Secondary | ICD-10-CM | POA: Diagnosis not present

## 2014-09-04 DIAGNOSIS — D6489 Other specified anemias: Secondary | ICD-10-CM | POA: Diagnosis not present

## 2014-09-04 DIAGNOSIS — K219 Gastro-esophageal reflux disease without esophagitis: Secondary | ICD-10-CM

## 2014-09-04 DIAGNOSIS — E119 Type 2 diabetes mellitus without complications: Secondary | ICD-10-CM | POA: Diagnosis not present

## 2014-09-04 LAB — HEMOGLOBIN A1C: HEMOGLOBIN A1C: 6.1 % (ref 4.6–6.5)

## 2014-09-04 LAB — FERRITIN: FERRITIN: 23.9 ng/mL (ref 10.0–291.0)

## 2014-09-04 NOTE — Patient Instructions (Signed)
Limit your sodium (Salt) intake   Please check your hemoglobin A1c every 3 months    It is important that you exercise regularly, at least 20 minutes 3 to 4 times per week.  If you develop chest pain or shortness of breath seek  medical attention.   

## 2014-09-04 NOTE — Progress Notes (Signed)
Pre visit review using our clinic review tool, if applicable. No additional management support is needed unless otherwise documented below in the visit note. 

## 2014-09-04 NOTE — Progress Notes (Signed)
 Subjective:    Patient ID: Briana Patton, female    DOB: 10/01/1940, 73 y.o.   MRN: [id number]  HPI 73-year-old patient who is seen today in follow-up.  She has type 2 diabetes.  She has essential hypertension and also has a history of Barrett's esophagus.  3 months ago.  She was noted have some mild anemia.  She only takes Protonix when necessary. She has had a recent eye examination. No cardiopulmonary complaints.  She has dyslipidemia and remains on statin therapy.  Past Medical History  Diagnosis Date  . Diabetes mellitus   . Hyperlipidemia   . Hypertension   . Diverticulosis   . Osteoarthritis   . GERD (gastroesophageal reflux disease)   . Diverticulosis of colon (without mention of hemorrhage)   . Internal hemorrhoids without mention of complication   . Barrett esophagus 12/12/2000    egd bx    History   Social History  . Marital Status: Married    Spouse Name: N/A  . Number of Children: N/A  . Years of Education: N/A   Occupational History  . Not on file.   Social History Main Topics  . Smoking status: Former Smoker    Quit date: 03/08/1998  . Smokeless tobacco: Never Used  . Alcohol Use: No  . Drug Use: No  . Sexual Activity: Not on file   Other Topics Concern  . Not on file   Social History Narrative   Father age 74 complications of cardiac and cerebrovascular disease          Past Surgical History  Procedure Laterality Date  . Abdominal hysterectomy    . Cesarean section    . Lumbar laminectomy    . Knee arthroscopy      left knee surgery 1999  . Colonoscopy    . Upper gastrointestinal endoscopy      Family History  Problem Relation Age of Onset  . Diabetes Brother   . Diabetes Mother   . Diabetes Father   . Alcohol abuse Sister     X2  . Alcohol abuse Brother   . Colon cancer Neg Hx   . Esophageal cancer Neg Hx   . Rectal cancer Neg Hx   . Stomach cancer Neg Hx     Allergies  Allergen Reactions  . Penicillins     REACTION:  unspecified    Current Outpatient Prescriptions on File Prior to Visit  Medication Sig Dispense Refill  . albuterol (PROVENTIL HFA;VENTOLIN HFA) 108 (90 BASE) MCG/ACT inhaler Inhale 2 puffs into the lungs every 6 (six) hours as needed for wheezing or shortness of breath. 1 Inhaler 0  . aspirin 81 MG tablet Take 1 tablet (81 mg total) by mouth daily. 30 tablet   . atorvastatin (LIPITOR) 40 MG tablet Take 1 tablet (40 mg total) by mouth daily. 30 tablet 5  . benazepril-hydrochlorthiazide (LOTENSIN HCT) 20-12.5 MG per tablet Take 1 tablet by mouth daily. 30 tablet 5  . Blood Glucose Monitoring Suppl (FREESTYLE FREEDOM LITE) W/DEVICE KIT As directed , dx code 250.00 1 each 0  . fluticasone (FLONASE) 50 MCG/ACT nasal spray Place 2 sprays into both nostrils daily. 16 g 0  . glimepiride (AMARYL) 4 MG tablet Take 1 tablet (4 mg total) by mouth daily before breakfast. 30 tablet 5  . glucose blood (FREESTYLE LITE) test strip Test once daily. 100 each 3  . Lancets (FREESTYLE) lancets Test once daily. 100 each 5  . meloxicam (MOBIC) 7.5 MG tablet TAKE    Subjective:    Patient ID: Briana Patton, female    DOB: 10/01/1940, 73 y.o.   MRN: [id number]  HPI 73-year-old patient who is seen today in follow-up.  She has type 2 diabetes.  She has essential hypertension and also has a history of Barrett's esophagus.  3 months ago.  She was noted have some mild anemia.  She only takes Protonix when necessary. She has had a recent eye examination. No cardiopulmonary complaints.  She has dyslipidemia and remains on statin therapy.  Past Medical History  Diagnosis Date  . Diabetes mellitus   . Hyperlipidemia   . Hypertension   . Diverticulosis   . Osteoarthritis   . GERD (gastroesophageal reflux disease)   . Diverticulosis of colon (without mention of hemorrhage)   . Internal hemorrhoids without mention of complication   . Barrett esophagus 12/12/2000    egd bx    History   Social History  . Marital Status: Married    Spouse Name: N/A  . Number of Children: N/A  . Years of Education: N/A   Occupational History  . Not on file.   Social History Main Topics  . Smoking status: Former Smoker    Quit date: 03/08/1998  . Smokeless tobacco: Never Used  . Alcohol Use: No  . Drug Use: No  . Sexual Activity: Not on file   Other Topics Concern  . Not on file   Social History Narrative   Father age 74 complications of cardiac and cerebrovascular disease          Past Surgical History  Procedure Laterality Date  . Abdominal hysterectomy    . Cesarean section    . Lumbar laminectomy    . Knee arthroscopy      left knee surgery 1999  . Colonoscopy    . Upper gastrointestinal endoscopy      Family History  Problem Relation Age of Onset  . Diabetes Brother   . Diabetes Mother   . Diabetes Father   . Alcohol abuse Sister     X2  . Alcohol abuse Brother   . Colon cancer Neg Hx   . Esophageal cancer Neg Hx   . Rectal cancer Neg Hx   . Stomach cancer Neg Hx     Allergies  Allergen Reactions  . Penicillins     REACTION:  unspecified    Current Outpatient Prescriptions on File Prior to Visit  Medication Sig Dispense Refill  . albuterol (PROVENTIL HFA;VENTOLIN HFA) 108 (90 BASE) MCG/ACT inhaler Inhale 2 puffs into the lungs every 6 (six) hours as needed for wheezing or shortness of breath. 1 Inhaler 0  . aspirin 81 MG tablet Take 1 tablet (81 mg total) by mouth daily. 30 tablet   . atorvastatin (LIPITOR) 40 MG tablet Take 1 tablet (40 mg total) by mouth daily. 30 tablet 5  . benazepril-hydrochlorthiazide (LOTENSIN HCT) 20-12.5 MG per tablet Take 1 tablet by mouth daily. 30 tablet 5  . Blood Glucose Monitoring Suppl (FREESTYLE FREEDOM LITE) W/DEVICE KIT As directed , dx code 250.00 1 each 0  . fluticasone (FLONASE) 50 MCG/ACT nasal spray Place 2 sprays into both nostrils daily. 16 g 0  . glimepiride (AMARYL) 4 MG tablet Take 1 tablet (4 mg total) by mouth daily before breakfast. 30 tablet 5  . glucose blood (FREESTYLE LITE) test strip Test once daily. 100 each 3  . Lancets (FREESTYLE) lancets Test once daily. 100 each 5  . meloxicam (MOBIC) 7.5 MG tablet TAKE

## 2014-09-05 ENCOUNTER — Encounter: Payer: Self-pay | Admitting: Internal Medicine

## 2014-09-05 ENCOUNTER — Ambulatory Visit: Payer: Medicare Other | Admitting: Internal Medicine

## 2014-11-05 ENCOUNTER — Ambulatory Visit (AMBULATORY_SURGERY_CENTER): Payer: Self-pay

## 2014-11-05 VITALS — Ht 59.0 in | Wt 190.6 lb

## 2014-11-05 DIAGNOSIS — K2270 Barrett's esophagus without dysplasia: Secondary | ICD-10-CM

## 2014-11-05 NOTE — Progress Notes (Signed)
Per pt, no allergies to soy or egg products.Pt not taking any weight loss meds or using  O2 at home. 

## 2014-11-19 ENCOUNTER — Encounter: Payer: Self-pay | Admitting: Internal Medicine

## 2014-11-19 ENCOUNTER — Ambulatory Visit (AMBULATORY_SURGERY_CENTER): Payer: Medicare Other | Admitting: Internal Medicine

## 2014-11-19 VITALS — BP 134/61 | HR 62 | Temp 96.9°F | Resp 18 | Ht 59.0 in | Wt 190.0 lb

## 2014-11-19 DIAGNOSIS — B9681 Helicobacter pylori [H. pylori] as the cause of diseases classified elsewhere: Secondary | ICD-10-CM | POA: Diagnosis not present

## 2014-11-19 DIAGNOSIS — K208 Other esophagitis: Secondary | ICD-10-CM | POA: Diagnosis not present

## 2014-11-19 DIAGNOSIS — K2270 Barrett's esophagus without dysplasia: Secondary | ICD-10-CM | POA: Diagnosis present

## 2014-11-19 DIAGNOSIS — K2950 Unspecified chronic gastritis without bleeding: Secondary | ICD-10-CM | POA: Diagnosis not present

## 2014-11-19 MED ORDER — SODIUM CHLORIDE 0.9 % IV SOLN: 500.0000 mL | INTRAVENOUS | Status: DC

## 2014-11-19 NOTE — Progress Notes (Signed)
Called to room to assist during endoscopic procedure.  Patient ID and intended procedure confirmed with present staff. Received instructions for my participation in the procedure from the performing physician.  

## 2014-11-19 NOTE — Op Note (Signed)
[place]  [address] [place], [id number]   ENDOSCOPY PROCEDURE REPORT  PATIENT: Briana, Patton  MR#: [id number] BIRTHDATE: 03-27-1940 , 73  yrs. old GENDER: female ENDOSCOPIST: Jerene Bears, MD REFERRED BY:  Verl Blalock, M.D. PROCEDURE DATE:  11/19/2014 PROCEDURE:  EGD w/ biopsy ASA CLASS:     Class III INDICATIONS:  Surveillance of Barrett's esophagus, last endoscopy 2013 by Dr.  Sharlett Iles. MEDICATIONS: Monitored anesthesia care and Propofol 150 mg IV TOPICAL ANESTHETIC: none  DESCRIPTION OF PROCEDURE: After the risks benefits and alternatives of the procedure were thoroughly explained, informed consent was obtained.  The LB [id number] endoscope was introduced through the mouth and advanced to the second portion of the duodenum , Without limitations.  The instrument was slowly withdrawn as the mucosa was fully examined.  ESOPHAGUS: An inlet patch was located in the proximal esophagus beginning 17 cm from the incisors and located on opposite walls. Multiple biopsies obtained to exclude dysplasia.   There was a 2 cm segment of suspected Barrett's esophagus found in the distal esophagus from 34 cm to 36 cm from the incisors.  There was no nodular mucosa noted in the Barrett's segment.  Multiple biopsies were performed using cold forceps.  Small hiatal hernia with no evidence of stricture. Prague criteria C2, M2.  STOMACH: The mucosa of the stomach appeared normal.  Cold forcep biopsies were taken at the gastric body, antrum and angularis to evaluate for h.  pylori given history of.  DUODENUM: The duodenal mucosa showed no abnormalities in the bulb and 2nd part of the duodenum.  Retroflexed views revealed no abnormalities.     The scope was then withdrawn from the patient and the procedure completed.  COMPLICATIONS: There were no immediate complications.  ENDOSCOPIC IMPRESSION: 1.   Inlet patch in the proximal esophagus, multiple  biopsies 2.  There was a 2cm segment of suspected Barrett's esophagus found in the distal esophagus; multiple biopsies were performed 3.   The mucosa of the stomach appeared normal; biopsies to exclude H. pylori given history of this infection 4.   The duodenal mucosa showed no abnormalities in the bulb and 2nd part of the duodenum  RECOMMENDATIONS: 1.  Await biopsy results 2.  Continue taking your PPI (antiacid medicine) once daily.  It is best to be taken 20-30 minutes prior to breakfast meal.  eSigned:  Jerene Bears, MD 11/19/2014 10:51 AM     VE:LFYBO Sherwood Gambler, MD and The Patient

## 2014-11-19 NOTE — Patient Instructions (Signed)

## 2014-11-20 ENCOUNTER — Telehealth: Payer: Self-pay | Admitting: *Deleted

## 2014-11-20 NOTE — Telephone Encounter (Signed)
Message left

## 2014-11-25 ENCOUNTER — Telehealth: Payer: Self-pay | Admitting: *Deleted

## 2014-11-25 ENCOUNTER — Encounter: Payer: Self-pay | Admitting: Internal Medicine

## 2014-11-25 ENCOUNTER — Other Ambulatory Visit: Payer: Self-pay | Admitting: *Deleted

## 2014-11-25 ENCOUNTER — Other Ambulatory Visit: Payer: Self-pay

## 2014-11-25 DIAGNOSIS — A048 Other specified bacterial intestinal infections: Secondary | ICD-10-CM

## 2014-11-25 MED ORDER — OMEPRAZOLE 20 MG PO CPDR: 20.0000 mg | DELAYED_RELEASE_CAPSULE | Freq: Two times a day (BID) | ORAL | Status: DC

## 2014-11-25 MED ORDER — BIS SUBCIT-METRONID-TETRACYC 140-125-125 MG PO CAPS: 3.0000 | ORAL_CAPSULE | Freq: Three times a day (TID) | ORAL | Status: DC

## 2014-11-25 NOTE — Telephone Encounter (Signed)
Call placed to patient to inform her that an Rx. Is being sent in for at [facility] to follow direction as prescribed and increase PPI to twice a day unril Rx,completed and then decrease to daily.pt. Verbalize understanding.

## 2014-12-04 ENCOUNTER — Telehealth: Payer: Self-pay | Admitting: *Deleted

## 2014-12-04 MED ORDER — BIS SUBCIT-METRONID-TETRACYC 140-125-125 MG PO CAPS: 3.0000 | ORAL_CAPSULE | Freq: Three times a day (TID) | ORAL | Status: DC

## 2014-12-04 NOTE — Telephone Encounter (Signed)
Patient's insurance company will not cover pylera. However, the Pylera drug rep has provided [place] with a full 10 day therapy course of Pylera for patient. Patient has been advised and will pick up sample today.

## 2014-12-26 ENCOUNTER — Other Ambulatory Visit: Payer: Self-pay | Admitting: Internal Medicine

## 2014-12-27 ENCOUNTER — Other Ambulatory Visit: Payer: Self-pay | Admitting: *Deleted

## 2014-12-27 MED ORDER — PANTOPRAZOLE SODIUM 40 MG PO TBEC: 40.0000 mg | DELAYED_RELEASE_TABLET | Freq: Every day | ORAL | Status: DC

## 2014-12-30 ENCOUNTER — Ambulatory Visit (INDEPENDENT_AMBULATORY_CARE_PROVIDER_SITE_OTHER): Payer: Medicare Other | Admitting: Internal Medicine

## 2014-12-30 ENCOUNTER — Encounter: Payer: Self-pay | Admitting: Internal Medicine

## 2014-12-30 VITALS — BP 120/62 | HR 64 | Temp 97.6°F | Resp 16 | Ht 59.0 in | Wt 194.5 lb

## 2014-12-30 DIAGNOSIS — E785 Hyperlipidemia, unspecified: Secondary | ICD-10-CM | POA: Diagnosis not present

## 2014-12-30 DIAGNOSIS — I10 Essential (primary) hypertension: Secondary | ICD-10-CM

## 2014-12-30 DIAGNOSIS — E119 Type 2 diabetes mellitus without complications: Secondary | ICD-10-CM

## 2014-12-30 LAB — HEMOGLOBIN A1C: Hgb A1c MFr Bld: 6.1 % (ref 4.6–6.5)

## 2014-12-30 NOTE — Progress Notes (Signed)
Pre visit review using our clinic review tool, if applicable. No additional management support is needed unless otherwise documented below in the visit note. 

## 2014-12-30 NOTE — Progress Notes (Signed)
Subjective:    Patient ID: Briana Patton, female    DOB: [birth year], 74 y.o.   MRN: [id number]  HPI  Lab Results  Component Value Date   HGBA1C 6.1 09/04/2014    Wt Readings from Last 3 Encounters:  12/30/14 194 lb 8 oz (88.225 kg)  11/19/14 190 lb (86.183 kg)  11/05/14 190 lb 9.6 oz (86.58 kg)   74 year old patient who is seen today in follow-up.  She has type 2 diabetes which has been very tightly controlled.  Fasting blood sugars are occasionally in the seventies.  No hypoglycemia.  She has treated dyslipidemia.  Has had an eye examination earlier this year.  She has treated hypertension which has been stable.  No concerns or complaints. No cardiopulmonary complaints  Past Medical History  Diagnosis Date  . Diabetes mellitus   . Hyperlipidemia   . Hypertension   . Diverticulosis   . Osteoarthritis   . GERD (gastroesophageal reflux disease)   . Diverticulosis of colon (without mention of hemorrhage)   . Internal hemorrhoids without mention of complication   . Barrett esophagus 12/12/2000    egd bx  . Abdominal pain     in past    Social History   Social History  . Marital Status: Married    Spouse Name: N/A  . Number of Children: N/A  . Years of Education: N/A   Occupational History  . Not on file.   Social History Main Topics  . Smoking status: Former Smoker    Types: Cigarettes    Quit date: 03/08/1998  . Smokeless tobacco: Never Used  . Alcohol Use: No  . Drug Use: No  . Sexual Activity: Not on file   Other Topics Concern  . Not on file   Social History Narrative   Father age 57 complications of cardiac and cerebrovascular disease          Past Surgical History  Procedure Laterality Date  . Abdominal hysterectomy    . Cesarean section      3 times  . Lumbar laminectomy    . Knee arthroscopy      left knee surgery 1999  . Colonoscopy    . Upper gastrointestinal endoscopy      Family History  Problem Relation Age of Onset  . Diabetes  Brother   . Diabetes Mother   . Diabetes Father   . Stroke Father   . Alcohol abuse Sister     X1  . Epilepsy Brother   . Colon cancer Neg Hx   . Esophageal cancer Neg Hx   . Rectal cancer Neg Hx   . Stomach cancer Neg Hx     Allergies  Allergen Reactions  . Penicillins     WHELTS    Current Outpatient Prescriptions on File Prior to Visit  Medication Sig Dispense Refill  . albuterol (PROVENTIL HFA;VENTOLIN HFA) 108 (90 BASE) MCG/ACT inhaler Inhale 2 puffs into the lungs every 6 (six) hours as needed for wheezing or shortness of breath. 1 Inhaler 0  . aspirin 81 MG tablet Take 1 tablet (81 mg total) by mouth daily. 30 tablet   . atorvastatin (LIPITOR) 40 MG tablet Take 1 tablet (40 mg total) by mouth daily. 30 tablet 5  . benazepril-hydrochlorthiazide (LOTENSIN HCT) 20-12.5 MG tablet TAKE ONE TABLET BY MOUTH ONCE DAILY 30 tablet 5  . bismuth-metronidazole-tetracycline (PYLERA) 140-125-125 MG capsule Take 3 capsules by mouth 4 (four) times daily -  before meals and at bedtime. 120 capsule  0  . Blood Glucose Monitoring Suppl (FREESTYLE FREEDOM LITE) W/DEVICE KIT As directed , dx code 250.00 1 each 0  . fluticasone (FLONASE) 50 MCG/ACT nasal spray Place 2 sprays into both nostrils daily. 16 g 0  . glimepiride (AMARYL) 4 MG tablet Take 1 tablet (4 mg total) by mouth daily before breakfast. 30 tablet 5  . glucose blood (FREESTYLE LITE) test strip Test once daily. 100 each 3  . Lancets (FREESTYLE) lancets Test once daily. 100 each 5  . meloxicam (MOBIC) 7.5 MG tablet TAKE ONE TABLET BY MOUTH ONCE DAILY 30 tablet 0  . metFORMIN (GLUCOPHAGE) 1000 MG tablet TAKE ONE TABLET BY MOUTH TWICE DAILY WITH MEALS (Patient taking differently: TAKE ONE TABLET BY MOUTH DAILY) 60 tablet 5  . omeprazole (PRILOSEC) 20 MG capsule Take 1 capsule (20 mg total) by mouth 2 (two) times daily before a meal. 20 capsule 0  . pantoprazole (PROTONIX) 40 MG tablet Take 1 tablet (40 mg total) by mouth daily. 30 tablet 2   . pioglitazone (ACTOS) 45 MG tablet Take 1 tablet (45 mg total) by mouth daily. 30 tablet 5  . Polyethyl Glycol-Propyl Glycol (SYSTANE OP) Apply to eye as needed. As directed for dry eyes    . Wheat Dextrin (BENEFIBER PO) Take 3 capsules by mouth as needed.      No current facility-administered medications on file prior to visit.    BP 120/62 mmHg  Pulse 64  Temp(Src) 97.6 F (36.4 C) (Oral)  Resp 16  Ht 4\' 11"  (1.499 m)  Wt 194 lb 8 oz (88.225 kg)  BMI 39.26 kg/m2      Review of Systems  Constitutional: Negative.   HENT: Negative for congestion, dental problem, hearing loss, rhinorrhea, sinus pressure, sore throat and tinnitus.   Eyes: Negative for pain, discharge and visual disturbance.  Respiratory: Negative for cough and shortness of breath.   Cardiovascular: Negative for chest pain, palpitations and leg swelling.  Gastrointestinal: Negative for nausea, vomiting, abdominal pain, diarrhea, constipation, blood in stool and abdominal distention.  Genitourinary: Negative for dysuria, urgency, frequency, hematuria, flank pain, vaginal bleeding, vaginal discharge, difficulty urinating, vaginal pain and pelvic pain.  Musculoskeletal: Negative for joint swelling, arthralgias and gait problem.  Skin: Negative for rash.  Neurological: Negative for dizziness, syncope, speech difficulty, weakness, numbness and headaches.  Hematological: Negative for adenopathy.  Psychiatric/Behavioral: Negative for behavioral problems, dysphoric mood and agitation. The patient is not nervous/anxious.        Objective:   Physical Exam  Constitutional: She is oriented to person, place, and time. She appears well-developed and well-nourished.  HENT:  Head: Normocephalic.  Right Ear: External ear normal.  Left Ear: External ear normal.  Mouth/Throat: Oropharynx is clear and moist.  Eyes: Conjunctivae and EOM are normal. Pupils are equal, round, and reactive to light.  Neck: Normal range of motion.  Neck supple. No thyromegaly present.  Cardiovascular: Normal rate, regular rhythm, normal heart sounds and intact distal pulses.   Pulmonary/Chest: Effort normal and breath sounds normal.  Abdominal: Soft. Bowel sounds are normal. She exhibits no mass. There is no tenderness.  Musculoskeletal: Normal range of motion.  Lymphadenopathy:    She has no cervical adenopathy.  Neurological: She is alert and oriented to person, place, and time.  Skin: Skin is warm and dry. No rash noted.  Psychiatric: She has a normal mood and affect. Her behavior is normal.          Assessment & Plan:   Diabetes  mellitus.  Will check a follow-up hemoglobin A1c Essential hypertension, stable Dyslipidemia.  Continue atorvastatin  Weight loss encouraged Gastroesophageal reflux disease.  Continue PPI therapy  Return in 6 months for follow-up  Preventive health.  Flu vaccine, declined

## 2014-12-30 NOTE — Patient Instructions (Signed)
Limit your sodium (Salt) intake   Please check your hemoglobin A1c every 3-6  months  Please check your blood pressure on a regular basis.  If it is consistently greater than 150/90, please make an office appointment.

## 2015-01-03 ENCOUNTER — Ambulatory Visit: Payer: Medicare Other | Admitting: Internal Medicine

## 2015-02-03 ENCOUNTER — Other Ambulatory Visit: Payer: Self-pay | Admitting: Internal Medicine

## 2015-04-09 ENCOUNTER — Encounter: Payer: Self-pay | Admitting: Family Medicine

## 2015-04-09 ENCOUNTER — Ambulatory Visit (INDEPENDENT_AMBULATORY_CARE_PROVIDER_SITE_OTHER): Payer: PPO | Admitting: Family Medicine

## 2015-04-09 VITALS — BP 128/70 | HR 66 | Temp 98.0°F | Wt 196.0 lb

## 2015-04-09 DIAGNOSIS — R51 Headache: Secondary | ICD-10-CM | POA: Diagnosis not present

## 2015-04-09 DIAGNOSIS — E119 Type 2 diabetes mellitus without complications: Secondary | ICD-10-CM

## 2015-04-09 DIAGNOSIS — R42 Dizziness and giddiness: Secondary | ICD-10-CM

## 2015-04-09 DIAGNOSIS — R519 Headache, unspecified: Secondary | ICD-10-CM

## 2015-04-09 LAB — POCT GLUCOSE (DEVICE FOR HOME USE)
GLUCOSE FASTING, POC: 96 mg/dL (ref 70–99)
POC Glucose: 96 mg/dl (ref 70–99)

## 2015-04-09 NOTE — Progress Notes (Signed)
[name], MD  Subjective:  Briana Patton is a 75 y.o. year old very pleasant female patient who presents for/with See problem oriented charting ROS- see ROS in HPI  Past Medical History- GERD, IBS, diabetes mellitus, hypertension  Medications- reviewed and updated Current Outpatient Prescriptions  Medication Sig Dispense Refill  . aspirin 81 MG tablet Take 1 tablet (81 mg total) by mouth daily. 30 tablet   . atorvastatin (LIPITOR) 40 MG tablet Take 1 tablet (40 mg total) by mouth daily. 30 tablet 5  . benazepril-hydrochlorthiazide (LOTENSIN HCT) 20-12.5 MG tablet TAKE ONE TABLET BY MOUTH ONCE DAILY 30 tablet 5  . bismuth-metronidazole-tetracycline (PYLERA) 140-125-125 MG capsule Take 3 capsules by mouth 4 (four) times daily -  before meals and at bedtime. 120 capsule 0  . Blood Glucose Monitoring Suppl (FREESTYLE FREEDOM LITE) W/DEVICE KIT As directed , dx code 250.00 1 each 0  . fluticasone (FLONASE) 50 MCG/ACT nasal spray Place 2 sprays into both nostrils daily. 16 g 0  . glimepiride (AMARYL) 4 MG tablet Take 1 tablet (4 mg total) by mouth daily before breakfast. 30 tablet 5  . glucose blood (FREESTYLE LITE) test strip Test once daily. 100 each 3  . Lancets (FREESTYLE) lancets Test once daily. 100 each 5  . meloxicam (MOBIC) 7.5 MG tablet TAKE ONE TABLET BY MOUTH ONCE DAILY 30 tablet 0  . metFORMIN (GLUCOPHAGE) 1000 MG tablet TAKE ONE TABLET BY MOUTH TWICE DAILY WITH MEALS (Patient taking differently: TAKE ONE TABLET BY MOUTH DAILY) 60 tablet 5  . omeprazole (PRILOSEC) 20 MG capsule Take 1 capsule (20 mg total) by mouth 2 (two) times daily before a meal. 20 capsule 0  . pantoprazole (PROTONIX) 40 MG tablet Take 1 tablet (40 mg total) by mouth daily. 30 tablet 2  . pioglitazone (ACTOS) 45 MG tablet Take 1 tablet (45 mg total) by mouth daily. 30 tablet 5  . Polyethyl Glycol-Propyl Glycol (SYSTANE OP) Apply to eye as needed. As directed for dry eyes    . Wheat Dextrin (BENEFIBER PO)  Take 3 capsules by mouth as needed.     Marland Kitchen albuterol (PROVENTIL HFA;VENTOLIN HFA) 108 (90 BASE) MCG/ACT inhaler Inhale 2 puffs into the lungs every 6 (six) hours as needed for wheezing or shortness of breath. (Patient not taking: Reported on 04/09/2015) 1 Inhaler 0   No current facility-administered medications for this visit.    Objective: BP 128/70 mmHg  Pulse 66  Temp(Src) 98 F (36.7 C)  Wt 196 lb (88.905 kg) Gen: NAD, resting comfortably CV: RRR no murmurs rubs or gallops Lungs: CTAB no crackles, wheeze, rhonchi Abdomen: soft/nontender/nondistended/normal bowel sounds. No rebound or guarding.  Ext: no edema Skin: warm, dry Neuro: CN II-XII intact, sensation and reflexes normal throughout, 5/5 muscle strength in bilateral upper and lower extremities. Normal finger to nose. Normal rapid alternating movements. No pronator drift. Normal romberg. Normal gait.   Assessment/Plan:  Headache mild and lighthedeness intermittent S:Over the last week, she has had intermittent issues with either mild occipital headaches or feeling lightheaded. She truly has a hard time describing this and says it feels like there is a "lump in my head" but there is not a lump in her head. Mild aching sensation. Sometimes she will have some mild lightheadedness accompany this and at other times night.  Earlier today had a time where she felt like she was spinning- had a hard time laying down as a result. No other vertigo spells. She has a hard time describing frequency  of episodes or how long they last as well but seems to be at least once a day. Currently, very mild lightheadedness. Has checked her sugar sometimes and states sometimes low sometimes not. She has had some below 70 but cannot quantify how low. She does seem to think her symptoms are more frequent with lower blood sugars. In regards to diabetes-She takes metformin 1066m in the evening as well as 1/2 a glimepiride after dinner. States fasting sugars are in  low 100s. Last a1c was 6.1  ROS- No facial or extremity weakness. No slurred words or trouble swallowing. no blurry vision or double vision. No paresthesias. No confusion or word finding difficulties.   A/P: Reassuring neurological exam. Patient consoled by this as her concern was potential CVA. Her symptoms are nonspecific and as stated exam reassuring. Due to being poor historian, I have asked her to collect more data/journal symptoms and return to see Dr. KRaliegh Ipnext week for follow up. I will have her hold the amaryl as potentially could be hypoglycemia related as seems more frequent episodes with lower blood sugars- she will begin to log sugars along with episodes. Today checked CBG and >90 despite mild lightheadedness  Sooner Return precautions advised.   Orders Placed This Encounter  Procedures  . POCT Glucose (Device for Home Use)   Results for orders placed or performed in visit on 04/09/15 (from the past 24 hour(s))  POCT Glucose (Device for Home Use)     Status: None   Collection Time: 04/09/15  4:28 PM  Result Value Ref Range   Glucose Fasting, POC 96 70 - 99 mg/dL   POC Glucose 96 70 - 99 mg/dl   >50% of 25 minute office visit was spent on counseling (patient very anxious about events, discussing how to log/journal for follow up, discussing sooner return precautions)  and coordination of care

## 2015-04-09 NOTE — Patient Instructions (Signed)
I want you to create a log over the next week of how frequently you are having episodes of the sensation in your head and lightheadedness. Write down when they happen, how long they last and also check your blood sugar with episodes and write it down.   Short term I want you to stop your glimepiride and see Dr. Raliegh Ip back next week to go over the findings (go ahead and schedule this today).   You had a reassuring exam and there were no obvious signs of stroke which you were concerned about. If you have new or worsening symptoms please see [place] sooner

## 2015-04-16 ENCOUNTER — Encounter: Payer: Self-pay | Admitting: Internal Medicine

## 2015-04-16 ENCOUNTER — Ambulatory Visit (INDEPENDENT_AMBULATORY_CARE_PROVIDER_SITE_OTHER): Payer: PPO | Admitting: Internal Medicine

## 2015-04-16 VITALS — BP 120/68 | HR 62 | Temp 98.0°F | Resp 20 | Ht 59.0 in | Wt 193.0 lb

## 2015-04-16 DIAGNOSIS — I10 Essential (primary) hypertension: Secondary | ICD-10-CM | POA: Diagnosis not present

## 2015-04-16 DIAGNOSIS — E119 Type 2 diabetes mellitus without complications: Secondary | ICD-10-CM | POA: Diagnosis not present

## 2015-04-16 LAB — HEMOGLOBIN A1C: HEMOGLOBIN A1C: 6.2 % (ref 4.6–6.5)

## 2015-04-16 NOTE — Progress Notes (Signed)
Subjective:    Patient ID: Briana Patton, female    DOB: 26-Oct-1940, 75 y.o.   MRN: [id number]  HPI  Lab Results  Component Value Date   HGBA1C 6.1 12/30/2014   75 year old patient who is seen today in follow-up.  She was seen 1 week ago by another provider with some vague headaches and dizziness.  She is much improved.  Her dizziness seems most consistent with some mild positional vertigo.  Glimepiride was on hold.  Blood sugars still remain fairly well controlled. She is scheduled for an eye examination in the near future She has dyslipidemia and remains on statin therapy. She also complains of some fatigue of several months duration.  She states when she awakes in the morning.  She does not feel refreshed  Wt Readings from Last 3 Encounters:  04/16/15 193 lb (87.544 kg)  04/09/15 196 lb (88.905 kg)  12/30/14 194 lb 8 oz (88.225 kg)    Past Medical History  Diagnosis Date  . Diabetes mellitus   . Hyperlipidemia   . Hypertension   . Diverticulosis   . Osteoarthritis   . GERD (gastroesophageal reflux disease)   . Diverticulosis of colon (without mention of hemorrhage)   . Internal hemorrhoids without mention of complication   . Barrett esophagus 12/12/2000    egd bx  . Abdominal pain     in past    Social History   Social History  . Marital Status: Married    Spouse Name: N/A  . Number of Children: N/A  . Years of Education: N/A   Occupational History  . Not on file.   Social History Main Topics  . Smoking status: Former Smoker    Types: Cigarettes    Quit date: 03/08/1998  . Smokeless tobacco: Never Used  . Alcohol Use: No  . Drug Use: No  . Sexual Activity: Not on file   Other Topics Concern  . Not on file   Social History Narrative   Father age 46 complications of cardiac and cerebrovascular disease          Past Surgical History  Procedure Laterality Date  . Abdominal hysterectomy    . Cesarean section      3 times  . Lumbar laminectomy      . Knee arthroscopy      left knee surgery 1999  . Colonoscopy    . Upper gastrointestinal endoscopy      Family History  Problem Relation Age of Onset  . Diabetes Brother   . Diabetes Mother   . Diabetes Father   . Stroke Father   . Alcohol abuse Sister     X39  . Epilepsy Brother   . Colon cancer Neg Hx   . Esophageal cancer Neg Hx   . Rectal cancer Neg Hx   . Stomach cancer Neg Hx     Allergies  Allergen Reactions  . Penicillins     WHELTS    Current Outpatient Prescriptions on File Prior to Visit  Medication Sig Dispense Refill  . albuterol (PROVENTIL HFA;VENTOLIN HFA) 108 (90 BASE) MCG/ACT inhaler Inhale 2 puffs into the lungs every 6 (six) hours as needed for wheezing or shortness of breath. 1 Inhaler 0  . aspirin 81 MG tablet Take 1 tablet (81 mg total) by mouth daily. 30 tablet   . atorvastatin (LIPITOR) 40 MG tablet Take 1 tablet (40 mg total) by mouth daily. 30 tablet 5  . benazepril-hydrochlorthiazide (LOTENSIN HCT) 20-12.5 MG tablet TAKE ONE  TABLET BY MOUTH ONCE DAILY 30 tablet 5  . bismuth-metronidazole-tetracycline (PYLERA) 140-125-125 MG capsule Take 3 capsules by mouth 4 (four) times daily -  before meals and at bedtime. 120 capsule 0  . Blood Glucose Monitoring Suppl (FREESTYLE FREEDOM LITE) W/DEVICE KIT As directed , dx code 250.00 1 each 0  . fluticasone (FLONASE) 50 MCG/ACT nasal spray Place 2 sprays into both nostrils daily. 16 g 0  . glimepiride (AMARYL) 4 MG tablet Take 1 tablet (4 mg total) by mouth daily before breakfast. 30 tablet 5  . glucose blood (FREESTYLE LITE) test strip Test once daily. 100 each 3  . Lancets (FREESTYLE) lancets Test once daily. 100 each 5  . meloxicam (MOBIC) 7.5 MG tablet TAKE ONE TABLET BY MOUTH ONCE DAILY 30 tablet 0  . metFORMIN (GLUCOPHAGE) 1000 MG tablet TAKE ONE TABLET BY MOUTH TWICE DAILY WITH MEALS (Patient taking differently: TAKE ONE TABLET BY MOUTH DAILY) 60 tablet 5  . omeprazole (PRILOSEC) 20 MG capsule Take 1  capsule (20 mg total) by mouth 2 (two) times daily before a meal. 20 capsule 0  . pantoprazole (PROTONIX) 40 MG tablet Take 1 tablet (40 mg total) by mouth daily. 30 tablet 2  . pioglitazone (ACTOS) 45 MG tablet Take 1 tablet (45 mg total) by mouth daily. 30 tablet 5  . Polyethyl Glycol-Propyl Glycol (SYSTANE OP) Apply to eye as needed. As directed for dry eyes    . Wheat Dextrin (BENEFIBER PO) Take 3 capsules by mouth as needed.      No current facility-administered medications on file prior to visit.    BP 120/68 mmHg  Pulse 62  Temp(Src) 98 F (36.7 C) (Oral)  Resp 20  Ht 4\' 11"  (1.499 m)  Wt 193 lb (87.544 kg)  BMI 38.96 kg/m2  SpO2 98%      Review of Systems  Constitutional: Positive for fatigue.  HENT: Negative for congestion, dental problem, hearing loss, rhinorrhea, sinus pressure, sore throat and tinnitus.   Eyes: Negative for pain, discharge and visual disturbance.  Respiratory: Negative for cough and shortness of breath.   Cardiovascular: Negative for chest pain, palpitations and leg swelling.  Gastrointestinal: Negative for nausea, vomiting, abdominal pain, diarrhea, constipation, blood in stool and abdominal distention.  Genitourinary: Negative for dysuria, urgency, frequency, hematuria, flank pain, vaginal bleeding, vaginal discharge, difficulty urinating, vaginal pain and pelvic pain.  Musculoskeletal: Negative for joint swelling, arthralgias and gait problem.  Skin: Negative for rash.  Neurological: Positive for light-headedness and headaches. Negative for dizziness, syncope, speech difficulty, weakness and numbness.  Hematological: Negative for adenopathy.  Psychiatric/Behavioral: Negative for behavioral problems, dysphoric mood and agitation. The patient is not nervous/anxious.        Objective:   Physical Exam  Constitutional: She is oriented to person, place, and time. She appears well-developed and well-nourished.  HENT:  Head: Normocephalic.  Right  Ear: External ear normal.  Left Ear: External ear normal.  Mouth/Throat: Oropharynx is clear and moist.  Eyes: Conjunctivae and EOM are normal. Pupils are equal, round, and reactive to light.  Neck: Normal range of motion. Neck supple. No thyromegaly present.  Cardiovascular: Normal rate, regular rhythm, normal heart sounds and intact distal pulses.   Pulmonary/Chest: Effort normal and breath sounds normal.  Abdominal: Soft. Bowel sounds are normal. She exhibits no mass. There is no tenderness.  Musculoskeletal: Normal range of motion.  Lymphadenopathy:    She has no cervical adenopathy.  Neurological: She is alert and oriented to person, place, and  time.  Skin: Skin is warm and dry. No rash noted.  Psychiatric: She has a normal mood and affect. Her behavior is normal.          Assessment & Plan:    Diabetes mellitus.  Will check a hemoglobin A1c.  We'll continue to hold glimepiride.  Will resume 2 mg daily.  If blood sugars are greater than 200 Fatigue.  Weight loss exercise encouraged.  CPX 3 months Follow-up eye examination scheduled Dizziness improved

## 2015-04-16 NOTE — Patient Instructions (Signed)
Limit your sodium (Salt) intake   Please check your hemoglobin A1c every 3 months    It is important that you exercise regularly, at least 20 minutes 3 to 4 times per week.  If you develop chest pain or shortness of breath seek  medical attention.  You need to lose weight.  Consider a lower calorie diet and regular exercise. 

## 2015-04-16 NOTE — Progress Notes (Signed)
Pre visit review using our clinic review tool, if applicable. No additional management support is needed unless otherwise documented below in the visit note. 

## 2015-05-22 ENCOUNTER — Other Ambulatory Visit: Payer: Self-pay | Admitting: Internal Medicine

## 2015-05-26 DIAGNOSIS — H25012 Cortical age-related cataract, left eye: Secondary | ICD-10-CM | POA: Diagnosis not present

## 2015-05-26 DIAGNOSIS — H35033 Hypertensive retinopathy, bilateral: Secondary | ICD-10-CM | POA: Diagnosis not present

## 2015-05-26 DIAGNOSIS — H25011 Cortical age-related cataract, right eye: Secondary | ICD-10-CM | POA: Diagnosis not present

## 2015-05-26 DIAGNOSIS — H2511 Age-related nuclear cataract, right eye: Secondary | ICD-10-CM | POA: Diagnosis not present

## 2015-05-26 DIAGNOSIS — H2512 Age-related nuclear cataract, left eye: Secondary | ICD-10-CM | POA: Diagnosis not present

## 2015-05-26 DIAGNOSIS — H2513 Age-related nuclear cataract, bilateral: Secondary | ICD-10-CM | POA: Diagnosis not present

## 2015-05-26 LAB — HM DIABETES EYE EXAM

## 2015-05-28 ENCOUNTER — Encounter: Payer: Self-pay | Admitting: Internal Medicine

## 2015-05-29 ENCOUNTER — Other Ambulatory Visit: Payer: Self-pay | Admitting: Internal Medicine

## 2015-06-30 ENCOUNTER — Ambulatory Visit: Payer: Medicare Other | Admitting: Internal Medicine

## 2015-07-01 DIAGNOSIS — H2512 Age-related nuclear cataract, left eye: Secondary | ICD-10-CM | POA: Diagnosis not present

## 2015-07-08 ENCOUNTER — Other Ambulatory Visit: Payer: Self-pay | Admitting: Internal Medicine

## 2015-07-30 DIAGNOSIS — M722 Plantar fascial fibromatosis: Secondary | ICD-10-CM | POA: Diagnosis not present

## 2015-07-30 DIAGNOSIS — M71572 Other bursitis, not elsewhere classified, left ankle and foot: Secondary | ICD-10-CM | POA: Diagnosis not present

## 2015-07-30 DIAGNOSIS — M79671 Pain in right foot: Secondary | ICD-10-CM | POA: Diagnosis not present

## 2015-07-30 DIAGNOSIS — M71571 Other bursitis, not elsewhere classified, right ankle and foot: Secondary | ICD-10-CM | POA: Diagnosis not present

## 2015-07-30 DIAGNOSIS — M7732 Calcaneal spur, left foot: Secondary | ICD-10-CM | POA: Diagnosis not present

## 2015-07-30 DIAGNOSIS — M79672 Pain in left foot: Secondary | ICD-10-CM | POA: Diagnosis not present

## 2015-08-12 ENCOUNTER — Ambulatory Visit (INDEPENDENT_AMBULATORY_CARE_PROVIDER_SITE_OTHER): Payer: PPO | Admitting: Internal Medicine

## 2015-08-12 ENCOUNTER — Encounter: Payer: Self-pay | Admitting: Internal Medicine

## 2015-08-12 VITALS — BP 146/80 | HR 63 | Temp 98.5°F | Resp 20 | Ht 59.0 in | Wt 193.0 lb

## 2015-08-12 DIAGNOSIS — I10 Essential (primary) hypertension: Secondary | ICD-10-CM

## 2015-08-12 DIAGNOSIS — E785 Hyperlipidemia, unspecified: Secondary | ICD-10-CM | POA: Diagnosis not present

## 2015-08-12 DIAGNOSIS — E119 Type 2 diabetes mellitus without complications: Secondary | ICD-10-CM | POA: Diagnosis not present

## 2015-08-12 LAB — COMPREHENSIVE METABOLIC PANEL
ALK PHOS: 53 U/L (ref 39–117)
ALT: 13 U/L (ref 0–35)
AST: 16 U/L (ref 0–37)
Albumin: 4.2 g/dL (ref 3.5–5.2)
BUN: 18 mg/dL (ref 6–23)
CALCIUM: 9.4 mg/dL (ref 8.4–10.5)
CO2: 29 mEq/L (ref 19–32)
Chloride: 101 mEq/L (ref 96–112)
Creatinine, Ser: 0.88 mg/dL (ref 0.40–1.20)
GFR: 66.82 mL/min (ref >60.00)
GLUCOSE: 94 mg/dL (ref 70–99)
POTASSIUM: 4.9 meq/L (ref 3.5–5.1)
Sodium: 138 mEq/L (ref 135–145)
Total Bilirubin: 0.6 mg/dL (ref 0.2–1.2)
Total Protein: 6.9 g/dL (ref 6.0–8.3)

## 2015-08-12 LAB — CBC WITH DIFFERENTIAL/PLATELET
BASOS ABS: 0.1 10*3/uL (ref 0.0–0.1)
Basophils Relative: 0.9 % (ref 0.0–3.0)
EOS ABS: 0.1 10*3/uL (ref 0.0–0.7)
Eosinophils Relative: 1.9 % (ref 0.0–5.0)
HCT: 32.3 % — ABNORMAL LOW (ref 36.0–46.0)
Hemoglobin: 10.6 g/dL — ABNORMAL LOW (ref 12.0–15.0)
LYMPHS ABS: 1.9 10*3/uL (ref 0.7–4.0)
Lymphocytes Relative: 31 % (ref 12.0–46.0)
MCHC: 33 g/dL (ref 30.0–36.0)
MCV: 90.4 fl (ref 78.0–100.0)
MONO ABS: 0.4 10*3/uL (ref 0.1–1.0)
MONOS PCT: 6.7 % (ref 3.0–12.0)
NEUTROS ABS: 3.6 10*3/uL (ref 1.4–7.7)
NEUTROS PCT: 59.5 % (ref 43.0–77.0)
PLATELETS: 195 10*3/uL (ref 150.0–400.0)
RBC: 3.57 Mil/uL — AB (ref 3.87–5.11)
RDW: 15.7 % — ABNORMAL HIGH (ref 11.5–15.5)
WBC: 6.1 10*3/uL (ref 4.0–10.5)

## 2015-08-12 LAB — MICROALBUMIN / CREATININE URINE RATIO
CREATININE, U: 105.5 mg/dL
MICROALB/CREAT RATIO: 0.7 mg/g (ref 0.0–30.0)

## 2015-08-12 LAB — LIPID PANEL
CHOL/HDL RATIO: 3
Cholesterol: 153 mg/dL (ref 0–200)
HDL: 60.9 mg/dL (ref >39.00)
LDL Cholesterol: 67 mg/dL (ref 0–99)
NONHDL: 91.9
TRIGLYCERIDES: 124 mg/dL (ref 0.0–149.0)
VLDL: 24.8 mg/dL (ref 0.0–40.0)

## 2015-08-12 LAB — TSH: TSH: 3.87 u[IU]/mL (ref 0.35–4.50)

## 2015-08-12 LAB — HEMOGLOBIN A1C: Hgb A1c MFr Bld: 6.3 % (ref 4.6–6.5)

## 2015-08-12 NOTE — Progress Notes (Signed)
Pre visit review using our clinic review tool, if applicable. No additional management support is needed unless otherwise documented below in the visit note. 

## 2015-08-12 NOTE — Progress Notes (Signed)
Subjective:    Patient ID: Briana Patton, female    DOB: 1940-04-05, 75 y.o.   MRN: [id number]  HPI  Lab Results  Component Value Date   HGBA1C 6.2 04/16/2015   75 year old patient who is seen today in follow-up.  She has a history of type 2 diabetes. Complaints today include fatigue.  She also has a three-week history of some right flank discomfort.  Wt Readings from Last 3 Encounters:  08/12/15 193 lb (87.544 kg)  04/16/15 193 lb (87.544 kg)  04/09/15 196 lb (88.905 kg)   She has maintained nice glycemic control.  Past Medical History  Diagnosis Date  . Diabetes mellitus   . Hyperlipidemia   . Hypertension   . Diverticulosis   . Osteoarthritis   . GERD (gastroesophageal reflux disease)   . Diverticulosis of colon (without mention of hemorrhage)   . Internal hemorrhoids without mention of complication   . Barrett esophagus 12/12/2000    egd bx  . Abdominal pain     in past     Social History   Social History  . Marital Status: Married    Spouse Name: N/A  . Number of Children: N/A  . Years of Education: N/A   Occupational History  . Not on file.   Social History Main Topics  . Smoking status: Former Smoker    Types: Cigarettes    Quit date: 03/08/1998  . Smokeless tobacco: Never Used  . Alcohol Use: No  . Drug Use: No  . Sexual Activity: Not on file   Other Topics Concern  . Not on file   Social History Narrative   Father age 24 complications of cardiac and cerebrovascular disease          Past Surgical History  Procedure Laterality Date  . Abdominal hysterectomy    . Cesarean section      3 times  . Lumbar laminectomy    . Knee arthroscopy      left knee surgery 1999  . Colonoscopy    . Upper gastrointestinal endoscopy      Family History  Problem Relation Age of Onset  . Diabetes Brother   . Diabetes Mother   . Diabetes Father   . Stroke Father   . Alcohol abuse Sister     X59  . Epilepsy Brother   . Colon cancer Neg Hx   .  Esophageal cancer Neg Hx   . Rectal cancer Neg Hx   . Stomach cancer Neg Hx     Allergies  Allergen Reactions  . Penicillins     WHELTS    Current Outpatient Prescriptions on File Prior to Visit  Medication Sig Dispense Refill  . albuterol (PROVENTIL HFA;VENTOLIN HFA) 108 (90 BASE) MCG/ACT inhaler Inhale 2 puffs into the lungs every 6 (six) hours as needed for wheezing or shortness of breath. 1 Inhaler 0  . aspirin 81 MG tablet Take 1 tablet (81 mg total) by mouth daily. 30 tablet   . atorvastatin (LIPITOR) 40 MG tablet TAKE ONE TABLET BY MOUTH ONCE DAILY 30 tablet 5  . benazepril-hydrochlorthiazide (LOTENSIN HCT) 20-12.5 MG tablet TAKE ONE TABLET BY MOUTH ONCE DAILY 30 tablet 5  . bismuth-metronidazole-tetracycline (PYLERA) 140-125-125 MG capsule Take 3 capsules by mouth 4 (four) times daily -  before meals and at bedtime. 120 capsule 0  . Blood Glucose Monitoring Suppl (FREESTYLE FREEDOM LITE) W/DEVICE KIT As directed , dx code 250.00 1 each 0  . fluticasone (FLONASE) 50 MCG/ACT nasal spray  Place 2 sprays into both nostrils daily. 16 g 0  . glimepiride (AMARYL) 4 MG tablet TAKE ONE TABLET BY MOUTH ONCE DAILY BEFORE BREAKFAST 30 tablet 5  . glucose blood (FREESTYLE LITE) test strip Test once daily. 100 each 3  . Lancets (FREESTYLE) lancets Test once daily. 100 each 5  . meloxicam (MOBIC) 7.5 MG tablet TAKE ONE TABLET BY MOUTH ONCE DAILY 30 tablet 0  . metFORMIN (GLUCOPHAGE) 1000 MG tablet TAKE ONE TABLET BY MOUTH TWICE DAILY WITH MEALS 60 tablet 5  . omeprazole (PRILOSEC) 20 MG capsule Take 1 capsule (20 mg total) by mouth 2 (two) times daily before a meal. 20 capsule 0  . pantoprazole (PROTONIX) 40 MG tablet Take 1 tablet (40 mg total) by mouth daily. 30 tablet 2  . pioglitazone (ACTOS) 45 MG tablet TAKE ONE TABLET BY MOUTH ONCE DAILY 30 tablet 5  . Polyethyl Glycol-Propyl Glycol (SYSTANE OP) Apply to eye as needed. As directed for dry eyes    . Wheat Dextrin (BENEFIBER PO) Take 3  capsules by mouth as needed.      No current facility-administered medications on file prior to visit.    BP 146/80 mmHg  Pulse 63  Temp(Src) 98.5 F (36.9 C) (Oral)  Resp 20  Ht 4\' 11"  (1.499 m)  Wt 193 lb (87.544 kg)  BMI 38.96 kg/m2  SpO2 98%    Review of Systems  Constitutional: Positive for fatigue.  HENT: Negative for congestion, dental problem, hearing loss, rhinorrhea, sinus pressure, sore throat and tinnitus.   Eyes: Negative for pain, discharge and visual disturbance.  Respiratory: Negative for cough and shortness of breath.   Cardiovascular: Negative for chest pain, palpitations and leg swelling.  Gastrointestinal: Positive for abdominal pain. Negative for nausea, vomiting, diarrhea, constipation, blood in stool and abdominal distention.  Genitourinary: Negative for dysuria, urgency, frequency, hematuria, flank pain, vaginal bleeding, vaginal discharge, difficulty urinating, vaginal pain and pelvic pain.  Musculoskeletal: Negative for joint swelling, arthralgias and gait problem.  Skin: Negative for rash.  Neurological: Negative for dizziness, syncope, speech difficulty, weakness, numbness and headaches.  Hematological: Negative for adenopathy.  Psychiatric/Behavioral: Negative for behavioral problems, dysphoric mood and agitation. The patient is not nervous/anxious.        Objective:   Physical Exam  Constitutional: She is oriented to person, place, and time. She appears well-developed and well-nourished.  HENT:  Head: Normocephalic.  Right Ear: External ear normal.  Left Ear: External ear normal.  Mouth/Throat: Oropharynx is clear and moist.  Eyes: Conjunctivae and EOM are normal. Pupils are equal, round, and reactive to light.  Neck: Normal range of motion. Neck supple. No thyromegaly present.  Cardiovascular: Normal rate, regular rhythm, normal heart sounds and intact distal pulses.   Pulmonary/Chest: Effort normal and breath sounds normal.  Abdominal:  Soft. Bowel sounds are normal. She exhibits no distension and no mass. There is no tenderness. There is no rebound.  Obese, soft and nontender.  No CVA tenderness  Musculoskeletal: Normal range of motion.  Lymphadenopathy:    She has no cervical adenopathy.  Neurological: She is alert and oriented to person, place, and time.  Skin: Skin is warm and dry. No rash noted.  Psychiatric: She has a normal mood and affect. Her behavior is normal.          Assessment & Plan:   Diabetes mellitus.  Will check a hemoglobin A1c Essential hypertension, stable Fatigue.  We'll check updated lab Right flank pain, unclear etiology.  Will consider  abdominal ultrasound.  If persists  Recheck 3 months Weight loss encouraged   Rogelia Boga, MD

## 2015-08-12 NOTE — Patient Instructions (Addendum)
Limit your sodium (Salt) intake    It is important that you exercise regularly, at least 20 minutes 3 to 4 times per week.  If you develop chest pain or shortness of breath seek  medical attention.  You need to lose weight.  Consider a lower calorie diet and regular exercise.  Return in 3 months for follow-up  Call or return to clinic prn if these symptoms worsen or fail to improve as anticipated.  Please report any new or worsening symptoms

## 2015-08-13 ENCOUNTER — Ambulatory Visit: Payer: PPO | Admitting: Internal Medicine

## 2015-08-14 ENCOUNTER — Other Ambulatory Visit: Payer: Self-pay | Admitting: Internal Medicine

## 2015-11-11 ENCOUNTER — Telehealth: Payer: Self-pay | Admitting: Internal Medicine

## 2015-11-12 ENCOUNTER — Encounter: Payer: Self-pay | Admitting: Internal Medicine

## 2015-11-12 ENCOUNTER — Ambulatory Visit (INDEPENDENT_AMBULATORY_CARE_PROVIDER_SITE_OTHER): Payer: PPO | Admitting: Internal Medicine

## 2015-11-12 VITALS — BP 138/70 | HR 61 | Temp 98.5°F | Resp 20 | Ht 59.0 in | Wt 193.5 lb

## 2015-11-12 DIAGNOSIS — E119 Type 2 diabetes mellitus without complications: Secondary | ICD-10-CM | POA: Diagnosis not present

## 2015-11-12 DIAGNOSIS — E785 Hyperlipidemia, unspecified: Secondary | ICD-10-CM

## 2015-11-12 DIAGNOSIS — I10 Essential (primary) hypertension: Secondary | ICD-10-CM | POA: Diagnosis not present

## 2015-11-12 NOTE — Progress Notes (Signed)
Subjective:    Patient ID: Briana Patton, female    DOB: 12-Jul-1940, 75 y.o.   MRN: [id number]  HPI  75 year old patient who is seen today for follow-up of type 2 diabetes.  She has essential hypertension.  In general doing well.  Hemoglobin A1c's have been nausea controlled for some time. Main complaint today is some right shoulder discomfort. Laboratory studies last visit were unremarkable except for some mild chronic anemia.  The patient has been followed by GI and has had upper and lower endoscopies fairly recently.  She was treated for H. pylori infection less than 1 year ago  Past Medical History:  Diagnosis Date  . Abdominal pain    in past  . Barrett esophagus 12/12/2000   egd bx  . Diabetes mellitus   . Diverticulosis   . Diverticulosis of colon (without mention of hemorrhage)   . GERD (gastroesophageal reflux disease)   . Hyperlipidemia   . Hypertension   . Internal hemorrhoids without mention of complication   . Osteoarthritis      Social History   Social History  . Marital status: Married    Spouse name: N/A  . Number of children: N/A  . Years of education: N/A   Occupational History  . Not on file.   Social History Main Topics  . Smoking status: Former Smoker    Types: Cigarettes    Quit date: 03/08/1998  . Smokeless tobacco: Never Used  . Alcohol use No  . Drug use: No  . Sexual activity: Not on file   Other Topics Concern  . Not on file   Social History Narrative   Father age 85 complications of cardiac and cerebrovascular disease          Past Surgical History:  Procedure Laterality Date  . ABDOMINAL HYSTERECTOMY    . CESAREAN SECTION     3 times  . COLONOSCOPY    . KNEE ARTHROSCOPY     left knee surgery 1999  . LUMBAR LAMINECTOMY    . UPPER GASTROINTESTINAL ENDOSCOPY      Family History  Problem Relation Age of Onset  . Diabetes Brother   . Diabetes Mother   . Diabetes Father   . Stroke Father   . Alcohol abuse Sister     X7    . Epilepsy Brother   . Colon cancer Neg Hx   . Esophageal cancer Neg Hx   . Rectal cancer Neg Hx   . Stomach cancer Neg Hx     Allergies  Allergen Reactions  . Penicillins     WHELTS    Current Outpatient Prescriptions on File Prior to Visit  Medication Sig Dispense Refill  . albuterol (PROVENTIL HFA;VENTOLIN HFA) 108 (90 BASE) MCG/ACT inhaler Inhale 2 puffs into the lungs every 6 (six) hours as needed for wheezing or shortness of breath. 1 Inhaler 0  . aspirin 81 MG tablet Take 1 tablet (81 mg total) by mouth daily. 30 tablet   . atorvastatin (LIPITOR) 40 MG tablet TAKE ONE TABLET BY MOUTH ONCE DAILY 30 tablet 5  . benazepril-hydrochlorthiazide (LOTENSIN HCT) 20-12.5 MG tablet TAKE ONE TABLET BY MOUTH ONCE DAILY 30 tablet 5  . bismuth-metronidazole-tetracycline (PYLERA) 140-125-125 MG capsule Take 3 capsules by mouth 4 (four) times daily -  before meals and at bedtime. 120 capsule 0  . Blood Glucose Monitoring Suppl (FREESTYLE FREEDOM LITE) W/DEVICE KIT As directed , dx code 250.00 1 each 0  . fluticasone (FLONASE) 50 MCG/ACT nasal spray  Place 2 sprays into both nostrils daily. 16 g 0  . glimepiride (AMARYL) 4 MG tablet TAKE ONE TABLET BY MOUTH ONCE DAILY BEFORE BREAKFAST 30 tablet 5  . glucose blood (FREESTYLE LITE) test strip Test once daily. 100 each 3  . ketorolac (ACULAR) 0.5 % ophthalmic solution Place 1 drop into the left eye 4 (four) times daily.     . Lancets (FREESTYLE) lancets Test once daily. 100 each 5  . meloxicam (MOBIC) 7.5 MG tablet TAKE ONE TABLET BY MOUTH ONCE DAILY 30 tablet 0  . metFORMIN (GLUCOPHAGE) 1000 MG tablet TAKE ONE TABLET BY MOUTH TWICE DAILY WITH MEALS 60 tablet 5  . ofloxacin (OCUFLOX) 0.3 % ophthalmic solution Place 1 drop into the left eye 2 (two) times daily.     Marland Kitchen omeprazole (PRILOSEC) 20 MG capsule Take 1 capsule (20 mg total) by mouth 2 (two) times daily before a meal. 20 capsule 0  . pantoprazole (PROTONIX) 40 MG tablet Take 1 tablet (40 mg  total) by mouth daily. 30 tablet 2  . pioglitazone (ACTOS) 45 MG tablet TAKE ONE TABLET BY MOUTH ONCE DAILY 30 tablet 5  . Polyethyl Glycol-Propyl Glycol (SYSTANE OP) Apply to eye as needed. As directed for dry eyes    . prednisoLONE acetate (PRED FORTE) 1 % ophthalmic suspension Place 1 drop into the left eye 2 (two) times daily.     . Wheat Dextrin (BENEFIBER PO) Take 3 capsules by mouth as needed.      No current facility-administered medications on file prior to visit.     BP 138/70 (BP Location: Left Arm, Patient Position: Sitting, Cuff Size: Normal)   Pulse 61   Temp 98.5 F (36.9 C) (Oral)   Resp 20   Ht 4\' 11"  (1.499 m)   Wt 193 lb 8 oz (87.8 kg)   SpO2 99%   BMI 39.08 kg/m     Review of Systems  Constitutional: Negative.  Negative for appetite change, fatigue, fever and unexpected weight change.  HENT: Negative for congestion, dental problem, ear pain, hearing loss, mouth sores, nosebleeds, rhinorrhea, sinus pressure, sore throat, tinnitus, trouble swallowing and voice change.   Eyes: Negative for photophobia, pain, discharge, redness and visual disturbance.  Respiratory: Negative for cough, chest tightness and shortness of breath.   Cardiovascular: Negative for chest pain, palpitations and leg swelling.  Gastrointestinal: Negative for abdominal distention, abdominal pain, blood in stool, constipation, diarrhea, nausea, rectal pain and vomiting.  Genitourinary: Negative for difficulty urinating, dysuria, flank pain, frequency, genital sores, hematuria, menstrual problem, pelvic pain, urgency, vaginal bleeding, vaginal discharge and vaginal pain.  Musculoskeletal: Positive for arthralgias. Negative for back pain, gait problem, joint swelling and neck stiffness.  Skin: Negative for rash.  Neurological: Negative for dizziness, syncope, speech difficulty, weakness, light-headedness, numbness and headaches.  Hematological: Negative for adenopathy. Does not bruise/bleed easily.    Psychiatric/Behavioral: Negative for agitation, behavioral problems, dysphoric mood, self-injury and suicidal ideas. The patient is not nervous/anxious.        Objective:   Physical Exam  Constitutional: She is oriented to person, place, and time. She appears well-developed and well-nourished.  HENT:  Head: Normocephalic.  Right Ear: External ear normal.  Left Ear: External ear normal.  Mouth/Throat: Oropharynx is clear and moist.  Eyes: Conjunctivae and EOM are normal. Pupils are equal, round, and reactive to light.  Neck: Normal range of motion. Neck supple. No thyromegaly present.  Cardiovascular: Normal rate, regular rhythm, normal heart sounds and intact distal pulses.  Pulmonary/Chest: Effort normal and breath sounds normal.  Abdominal: Soft. Bowel sounds are normal. She exhibits no mass. There is no tenderness.  Musculoskeletal: Normal range of motion.  Lymphadenopathy:    She has no cervical adenopathy.  Neurological: She is alert and oriented to person, place, and time.  Skin: Skin is warm and dry. No rash noted.  Psychiatric: She has a normal mood and affect. Her behavior is normal.          Assessment & Plan:   Diabetes mellitus.  Well controlled.  We'll defer hemoglobin A1c until 3 months Essential hypertension, stable Right shoulder pain.  Probable rotator cuff tendinopathy.  Will start some rehabilitation exercises/stretching.  Orthopedic referral if unimproved  Rogelia Boga

## 2015-11-12 NOTE — Progress Notes (Signed)
Pre visit review using our clinic review tool, if applicable. No additional management support is needed unless otherwise documented below in the visit note. 

## 2015-11-12 NOTE — Patient Instructions (Addendum)
Limit your sodium (Salt) intake    It is important that you exercise regularly, at least 20 minutes 3 to 4 times per week.  If you develop chest pain or shortness of breath seek  medical attention.  You need to lose weight.  Consider a lower calorie diet and regular exercise.   Please check your hemoglobin A1c every 3-6  Months    Impingement Syndrome, Rotator Cuff, Bursitis With Rehab Impingement syndrome is a condition that involves inflammation of the tendons of the rotator cuff and the subacromial bursa, that causes pain in the shoulder. The rotator cuff consists of four tendons and muscles that control much of the shoulder and upper arm function. The subacromial bursa is a fluid filled sac that helps reduce friction between the rotator cuff and one of the bones of the shoulder (acromion). Impingement syndrome is usually an overuse injury that causes swelling of the bursa (bursitis), swelling of the tendon (tendonitis), and/or a tear of the tendon (strain). Strains are classified into three categories. Grade 1 strains cause pain, but the tendon is not lengthened. Grade 2 strains include a lengthened ligament, due to the ligament being stretched or partially ruptured. With grade 2 strains there is still function, although the function may be decreased. Grade 3 strains include a complete tear of the tendon or muscle, and function is usually impaired. SYMPTOMS   Pain around the shoulder, often at the outer portion of the upper arm.  Pain that gets worse with shoulder function, especially when reaching overhead or lifting.  Sometimes, aching when not using the arm.  Pain that wakes you up at night.  Sometimes, tenderness, swelling, warmth, or redness over the affected area.  Loss of strength.  Limited motion of the shoulder, especially reaching behind the back (to the back pocket or to unhook bra) or across your body.  Crackling sound (crepitation) when moving the arm.  Biceps tendon  pain and inflammation (in the front of the shoulder). Worse when bending the elbow or lifting. CAUSES  Impingement syndrome is often an overuse injury, in which chronic (repetitive) motions cause the tendons or bursa to become inflamed. A strain occurs when a force is paced on the tendon or muscle that is greater than it can withstand. Common mechanisms of injury include: Stress from sudden increase in duration, frequency, or intensity of training.  Direct hit (trauma) to the shoulder.  Aging, erosion of the tendon with normal use.  Bony bump on shoulder (acromial spur). RISK INCREASES WITH:  Contact sports (football, wrestling, boxing).  Throwing sports (baseball, tennis, volleyball).  Weightlifting and bodybuilding.  Heavy labor.  Previous injury to the rotator cuff, including impingement.  Poor shoulder strength and flexibility.  Failure to warm up properly before activity.  Inadequate protective equipment.  Old age.  Bony bump on shoulder (acromial spur). PREVENTION   Warm up and stretch properly before activity.  Allow for adequate recovery between workouts.  Maintain physical fitness:  Strength, flexibility, and endurance.  Cardiovascular fitness.  Learn and use proper exercise technique. PROGNOSIS  If treated properly, impingement syndrome usually goes away within 6 weeks. Sometimes surgery is required.  RELATED COMPLICATIONS   Longer healing time if not properly treated, or if not given enough time to heal.  Recurring symptoms, that result in a chronic condition.  Shoulder stiffness, frozen shoulder, or loss of motion.  Rotator cuff tendon tear.  Recurring symptoms, especially if activity is resumed too soon, with overuse, with a direct blow, or when  using poor technique. TREATMENT  Treatment first involves the use of ice and medicine, to reduce pain and inflammation. The use of strengthening and stretching exercises may help reduce pain with  activity. These exercises may be performed at home or with a therapist. If non-surgical treatment is unsuccessful after more than 6 months, surgery may be advised. After surgery and rehabilitation, activity is usually possible in 3 months.  MEDICATION  If pain medicine is needed, nonsteroidal anti-inflammatory medicines (aspirin and ibuprofen), or other minor pain relievers (acetaminophen), are often advised.  Do not take pain medicine for 7 days before surgery.  Prescription pain relievers may be given, if your caregiver thinks they are needed. Use only as directed and only as much as you need.  Corticosteroid injections may be given by your caregiver. These injections should be reserved for the most serious cases, because they may only be given a certain number of times. HEAT AND COLD  Cold treatment (icing) should be applied for 10 to 15 minutes every 2 to 3 hours for inflammation and pain, and immediately after activity that aggravates your symptoms. Use ice packs or an ice massage.  Heat treatment may be used before performing stretching and strengthening activities prescribed by your caregiver, physical therapist, or athletic trainer. Use a heat pack or a warm water soak. SEEK MEDICAL CARE IF:   Symptoms get worse or do not improve in 4 to 6 weeks, despite treatment.  New, unexplained symptoms develop. (Drugs used in treatment may produce side effects.) EXERCISES  RANGE OF MOTION (ROM) AND STRETCHING EXERCISES - Impingement Syndrome (Rotator Cuff  Tendinitis, Bursitis) These exercises may help you when beginning to rehabilitate your injury. Your symptoms may go away with or without further involvement from your physician, physical therapist or athletic trainer. While completing these exercises, remember:   Restoring tissue flexibility helps normal motion to return to the joints. This allows healthier, less painful movement and activity.  An effective stretch should be held for at  least 30 seconds.  A stretch should never be painful. You should only feel a gentle lengthening or release in the stretched tissue. STRETCH - Flexion, Standing  Stand with good posture. With an underhand grip on your right / left hand, and an overhand grip on the opposite hand, grasp a broomstick or cane so that your hands are a little more than shoulder width apart.  Keeping your right / left elbow straight and shoulder muscles relaxed, push the stick with your opposite hand, to raise your right / left arm in front of your body and then overhead. Raise your arm until you feel a stretch in your right / left shoulder, but before you have increased shoulder pain.  Try to avoid shrugging your right / left shoulder as your arm rises, by keeping your shoulder blade tucked down and toward your mid-back spine. Hold for __________ seconds.  Slowly return to the starting position. Repeat __________ times. Complete this exercise __________ times per day. STRETCH - Abduction, Supine  Lie on your back. With an underhand grip on your right / left hand and an overhand grip on the opposite hand, grasp a broomstick or cane so that your hands are a little more than shoulder width apart.  Keeping your right / left elbow straight and your shoulder muscles relaxed, push the stick with your opposite hand, to raise your right / left arm out to the side of your body and then overhead. Raise your arm until you feel a stretch  in your right / left shoulder, but before you have increased shoulder pain.  Try to avoid shrugging your right / left shoulder as your arm rises, by keeping your shoulder blade tucked down and toward your mid-back spine. Hold for __________ seconds.  Slowly return to the starting position. Repeat __________ times. Complete this exercise __________ times per day. ROM - Flexion, Active-Assisted  Lie on your back. You may bend your knees for comfort.  Grasp a broomstick or cane so your hands are  about shoulder width apart. Your right / left hand should grip the end of the stick, so that your hand is positioned "thumbs-up," as if you were about to shake hands.  Using your healthy arm to lead, raise your right / left arm overhead, until you feel a gentle stretch in your shoulder. Hold for __________ seconds.  Use the stick to assist in returning your right / left arm to its starting position. Repeat __________ times. Complete this exercise __________ times per day.  ROM - Internal Rotation, Supine   Lie on your back on a firm surface. Place your right / left elbow about 60 degrees away from your side. Elevate your elbow with a folded towel, so that the elbow and shoulder are the same height.  Using a broomstick or cane and your strong arm, pull your right / left hand toward your body until you feel a gentle stretch, but no increase in your shoulder pain. Keep your shoulder and elbow in place throughout the exercise.  Hold for __________ seconds. Slowly return to the starting position. Repeat __________ times. Complete this exercise __________ times per day. STRETCH - Internal Rotation  Place your right / left hand behind your back, palm up.  Throw a towel or belt over your opposite shoulder. Grasp the towel with your right / left hand.  While keeping an upright posture, gently pull up on the towel, until you feel a stretch in the front of your right / left shoulder.  Avoid shrugging your right / left shoulder as your arm rises, by keeping your shoulder blade tucked down and toward your mid-back spine.  Hold for __________ seconds. Release the stretch, by lowering your healthy hand. Repeat __________ times. Complete this exercise __________ times per day. ROM - Internal Rotation   Using an underhand grip, grasp a stick behind your back with both hands.  While standing upright with good posture, slide the stick up your back until you feel a mild stretch in the front of your  shoulder.  Hold for __________ seconds. Slowly return to your starting position. Repeat __________ times. Complete this exercise __________ times per day.  STRETCH - Posterior Shoulder Capsule   Stand or sit with good posture. Grasp your right / left elbow and draw it across your chest, keeping it at the same height as your shoulder.  Pull your elbow, so your upper arm comes in closer to your chest. Pull until you feel a gentle stretch in the back of your shoulder.  Hold for __________ seconds. Repeat __________ times. Complete this exercise __________ times per day. STRENGTHENING EXERCISES - Impingement Syndrome (Rotator Cuff Tendinitis, Bursitis) These exercises may help you when beginning to rehabilitate your injury. They may resolve your symptoms with or without further involvement from your physician, physical therapist or athletic trainer. While completing these exercises, remember:  Muscles can gain both the endurance and the strength needed for everyday activities through controlled exercises.  Complete these exercises as instructed by your  physician, physical therapist or athletic trainer. Increase the resistance and repetitions only as guided.  You may experience muscle soreness or fatigue, but the pain or discomfort you are trying to eliminate should never worsen during these exercises. If this pain does get worse, stop and make sure you are following the directions exactly. If the pain is still present after adjustments, discontinue the exercise until you can discuss the trouble with your clinician.  During your recovery, avoid activity or exercises which involve actions that place your injured hand or elbow above your head or behind your back or head. These positions stress the tissues which you are trying to heal. STRENGTH - Scapular Depression and Adduction   With good posture, sit on a firm chair. Support your arms in front of you, with pillows, arm rests, or on a table top.  Have your elbows in line with the sides of your body.  Gently draw your shoulder blades down and toward your mid-back spine. Gradually increase the tension, without tensing the muscles along the top of your shoulders and the back of your neck.  Hold for __________ seconds. Slowly release the tension and relax your muscles completely before starting the next repetition.  After you have practiced this exercise, remove the arm support and complete the exercise in standing as well as sitting position. Repeat __________ times. Complete this exercise __________ times per day.  STRENGTH - Shoulder Abductors, Isometric  With good posture, stand or sit about 4-6 inches from a wall, with your right / left side facing the wall.  Bend your right / left elbow. Gently press your right / left elbow into the wall. Increase the pressure gradually, until you are pressing as hard as you can, without shrugging your shoulder or increasing any shoulder discomfort.  Hold for __________ seconds.  Release the tension slowly. Relax your shoulder muscles completely before you begin the next repetition. Repeat __________ times. Complete this exercise __________ times per day.  STRENGTH - External Rotators, Isometric  Keep your right / left elbow at your side and bend it 90 degrees.  Step into a door frame so that the outside of your right / left wrist can press against the door frame without your upper arm leaving your side.  Gently press your right / left wrist into the door frame, as if you were trying to swing the back of your hand away from your stomach. Gradually increase the tension, until you are pressing as hard as you can, without shrugging your shoulder or increasing any shoulder discomfort.  Hold for __________ seconds.  Release the tension slowly. Relax your shoulder muscles completely before you begin the next repetition. Repeat __________ times. Complete this exercise __________ times per day.   STRENGTH - Supraspinatus   Stand or sit with good posture. Grasp a __________ weight, or an exercise band or tubing, so that your hand is "thumbs-up," like you are shaking hands.  Slowly lift your right / left arm in a "V" away from your thigh, diagonally into the space between your side and straight ahead. Lift your hand to shoulder height or as far as you can, without increasing any shoulder pain. At first, many people do not lift their hands above shoulder height.  Avoid shrugging your right / left shoulder as your arm rises, by keeping your shoulder blade tucked down and toward your mid-back spine.  Hold for __________ seconds. Control the descent of your hand, as you slowly return to your starting position. Repeat  __________ times. Complete this exercise __________ times per day.  STRENGTH - External Rotators  Secure a rubber exercise band or tubing to a fixed object (table, pole) so that it is at the same height as your right / left elbow when you are standing or sitting on a firm surface.  Stand or sit so that the secured exercise band is at your uninjured side.  Bend your right / left elbow 90 degrees. Place a folded towel or small pillow under your right / left arm, so that your elbow is a few inches away from your side.  Keeping the tension on the exercise band, pull it away from your body, as if pivoting on your elbow. Be sure to keep your body steady, so that the movement is coming only from your rotating shoulder.  Hold for __________ seconds. Release the tension in a controlled manner, as you return to the starting position. Repeat __________ times. Complete this exercise __________ times per day.  STRENGTH - Internal Rotators   Secure a rubber exercise band or tubing to a fixed object (table, pole) so that it is at the same height as your right / left elbow when you are standing or sitting on a firm surface.  Stand or sit so that the secured exercise band is at your right /  left side.  Bend your elbow 90 degrees. Place a folded towel or small pillow under your right / left arm so that your elbow is a few inches away from your side.  Keeping the tension on the exercise band, pull it across your body, toward your stomach. Be sure to keep your body steady, so that the movement is coming only from your rotating shoulder.  Hold for __________ seconds. Release the tension in a controlled manner, as you return to the starting position. Repeat __________ times. Complete this exercise __________ times per day.  STRENGTH - Scapular Protractors, Standing   Stand arms length away from a wall. Place your hands on the wall, keeping your elbows straight.  Begin by dropping your shoulder blades down and toward your mid-back spine.  To strengthen your protractors, keep your shoulder blades down, but slide them forward on your rib cage. It will feel as if you are lifting the back of your rib cage away from the wall. This is a subtle motion and can be challenging to complete. Ask your caregiver for further instruction, if you are not sure you are doing the exercise correctly.  Hold for __________ seconds. Slowly return to the starting position, resting the muscles completely before starting the next repetition. Repeat __________ times. Complete this exercise __________ times per day. STRENGTH - Scapular Protractors, Supine  Lie on your back on a firm surface. Extend your right / left arm straight into the air while holding a __________ weight in your hand.  Keeping your head and back in place, lift your shoulder off the floor.  Hold for __________ seconds. Slowly return to the starting position, and allow your muscles to relax completely before starting the next repetition. Repeat __________ times. Complete this exercise __________ times per day. STRENGTH - Scapular Protractors, Quadruped  Get onto your hands and knees, with your shoulders directly over your hands (or as close  as you can be, comfortably).  Keeping your elbows locked, lift the back of your rib cage up into your shoulder blades, so your mid-back rounds out. Keep your neck muscles relaxed.  Hold this position for __________ seconds. Slowly return to  the starting position and allow your muscles to relax completely before starting the next repetition. Repeat __________ times. Complete this exercise __________ times per day.  STRENGTH - Scapular Retractors  Secure a rubber exercise band or tubing to a fixed object (table, pole), so that it is at the height of your shoulders when you are either standing, or sitting on a firm armless chair.  With a palm down grip, grasp an end of the band in each hand. Straighten your elbows and lift your hands straight in front of you, at shoulder height. Step back, away from the secured end of the band, until it becomes tense.  Squeezing your shoulder blades together, draw your elbows back toward your sides, as you bend them. Keep your upper arms lifted away from your body throughout the exercise.  Hold for __________ seconds. Slowly ease the tension on the band, as you reverse the directions and return to the starting position. Repeat __________ times. Complete this exercise __________ times per day. STRENGTH - Shoulder Extensors   Secure a rubber exercise band or tubing to a fixed object (table, pole) so that it is at the height of your shoulders when you are either standing, or sitting on a firm armless chair.  With a thumbs-up grip, grasp an end of the band in each hand. Straighten your elbows and lift your hands straight in front of you, at shoulder height. Step back, away from the secured end of the band, until it becomes tense.  Squeezing your shoulder blades together, pull your hands down to the sides of your thighs. Do not allow your hands to go behind you.  Hold for __________ seconds. Slowly ease the tension on the band, as you reverse the directions and  return to the starting position. Repeat __________ times. Complete this exercise __________ times per day.  STRENGTH - Scapular Retractors and External Rotators   Secure a rubber exercise band or tubing to a fixed object (table, pole) so that it is at the height as your shoulders, when you are either standing, or sitting on a firm armless chair.  With a palm down grip, grasp an end of the band in each hand. Bend your elbows 90 degrees and lift your elbows to shoulder height, at your sides. Step back, away from the secured end of the band, until it becomes tense.  Squeezing your shoulder blades together, rotate your shoulders so that your upper arms and elbows remain stationary, but your fists travel upward to head height.  Hold for __________ seconds. Slowly ease the tension on the band, as you reverse the directions and return to the starting position. Repeat __________ times. Complete this exercise __________ times per day.  STRENGTH - Scapular Retractors and External Rotators, Rowing   Secure a rubber exercise band or tubing to a fixed object (table, pole) so that it is at the height of your shoulders, when you are either standing, or sitting on a firm armless chair.  With a palm down grip, grasp an end of the band in each hand. Straighten your elbows and lift your hands straight in front of you, at shoulder height. Step back, away from the secured end of the band, until it becomes tense.  Step 1: Squeeze your shoulder blades together. Bending your elbows, draw your hands to your chest, as if you are rowing a boat. At the end of this motion, your hands and elbow should be at shoulder height and your elbows should be out to your  sides.  Step 2: Rotate your shoulders, to raise your hands above your head. Your forearms should be vertical and your upper arms should be horizontal.  Hold for __________ seconds. Slowly ease the tension on the band, as you reverse the directions and return to the  starting position. Repeat __________ times. Complete this exercise __________ times per day.  STRENGTH - Scapular Depressors  Find a sturdy chair without wheels, such as a dining room chair.  Keeping your feet on the floor, and your hands on the chair arms, lift your bottom up from the seat, and lock your elbows.  Keeping your elbows straight, allow gravity to pull your body weight down. Your shoulders will rise toward your ears.  Raise your body against gravity by drawing your shoulder blades down your back, shortening the distance between your shoulders and ears. Although your feet should always maintain contact with the floor, your feet should progressively support less body weight, as you get stronger.  Hold for __________ seconds. In a controlled and slow manner, lower your body weight to begin the next repetition. Repeat __________ times. Complete this exercise __________ times per day.    This information is not intended to replace advice given to you by your health care provider. Make sure you discuss any questions you have with your health care provider.   Document Released: 02/22/2005 Document Revised: 03/15/2014 Document Reviewed: 06/06/2008 Elsevier Interactive Patient Education [employer].

## 2015-11-13 NOTE — Telephone Encounter (Signed)
error 

## 2015-12-05 DIAGNOSIS — M47812 Spondylosis without myelopathy or radiculopathy, cervical region: Secondary | ICD-10-CM | POA: Diagnosis not present

## 2015-12-05 DIAGNOSIS — M7541 Impingement syndrome of right shoulder: Secondary | ICD-10-CM | POA: Diagnosis not present

## 2015-12-05 DIAGNOSIS — M25511 Pain in right shoulder: Secondary | ICD-10-CM | POA: Diagnosis not present

## 2015-12-09 DIAGNOSIS — H04123 Dry eye syndrome of bilateral lacrimal glands: Secondary | ICD-10-CM | POA: Diagnosis not present

## 2015-12-09 DIAGNOSIS — M3501 Sicca syndrome with keratoconjunctivitis: Secondary | ICD-10-CM | POA: Diagnosis not present

## 2015-12-19 ENCOUNTER — Other Ambulatory Visit: Payer: Self-pay | Admitting: Internal Medicine

## 2015-12-25 ENCOUNTER — Other Ambulatory Visit: Payer: Self-pay | Admitting: Internal Medicine

## 2016-01-01 ENCOUNTER — Ambulatory Visit (INDEPENDENT_AMBULATORY_CARE_PROVIDER_SITE_OTHER): Payer: PPO | Admitting: Family Medicine

## 2016-01-01 ENCOUNTER — Encounter: Payer: Self-pay | Admitting: Family Medicine

## 2016-01-01 VITALS — BP 110/72 | HR 75 | Resp 12 | Ht 59.0 in | Wt 193.4 lb

## 2016-01-01 DIAGNOSIS — M545 Low back pain, unspecified: Secondary | ICD-10-CM

## 2016-01-01 DIAGNOSIS — R109 Unspecified abdominal pain: Secondary | ICD-10-CM | POA: Diagnosis not present

## 2016-01-01 DIAGNOSIS — R3129 Other microscopic hematuria: Secondary | ICD-10-CM

## 2016-01-01 DIAGNOSIS — R10A2 Flank pain, left side: Secondary | ICD-10-CM

## 2016-01-01 LAB — POCT URINALYSIS DIPSTICK
Bilirubin, UA: NEGATIVE
Glucose, UA: POSITIVE
KETONES UA: NEGATIVE
Leukocytes, UA: NEGATIVE
Nitrite, UA: NEGATIVE
PH UA: 7
PROTEIN UA: NEGATIVE
RBC UA: POSITIVE
SPEC GRAV UA: 1.015
Urobilinogen, UA: 0.2

## 2016-01-01 LAB — URINALYSIS, MICROSCOPIC ONLY

## 2016-01-01 MED ORDER — MELOXICAM 7.5 MG PO TABS: 7.5000 mg | ORAL_TABLET | Freq: Every day | ORAL | 0 refills | Status: AC

## 2016-01-01 MED ORDER — TIZANIDINE HCL 4 MG PO TABS: 2.0000 mg | ORAL_TABLET | Freq: Every evening | ORAL | 0 refills | Status: AC | PRN

## 2016-01-01 NOTE — Progress Notes (Signed)
Pre visit review using our clinic review tool, if applicable. No additional management support is needed unless otherwise documented below in the visit note. 

## 2016-01-01 NOTE — Patient Instructions (Addendum)
  [name] I have seen you today for an acute visit.  1. Acute left-sided low back pain without sciatica  - meloxicam (MOBIC) 7.5 MG tablet; Take 1 tablet (7.5 mg total) by mouth daily. 1-2 tabs daily  Dispense: 30 tablet; Refill: 0 - tiZANidine (ZANAFLEX) 4 MG tablet; Take 0.5-1 tablets (2-4 mg total) by mouth at bedtime as needed for muscle spasms.  Dispense: 30 tablet; Refill: 0  2. Acute left flank pain  Seems muscle related. Over the counter Icy hot or Asper cream with Lidocaine.  Fall precautions mainly after taking muscle relaxant.      In general please monitor for signs of worsening symptoms and seek immediate medical attention if any concerning/warning symptom as we discussed.   If symptoms are not resolved in 1-2weeks you should schedule a follow up appointment with your doctor, before if needed.  Please be sure you have an appointment already scheduled with your PCP before you leave today.

## 2016-01-01 NOTE — Progress Notes (Signed)
HPI:  ACUTE VISIT:  Chief Complaint  Patient presents with  . left side pain    started saturday evening, pain today is not as bad as it was sunday-wednesday    [name] is a 75 y.o. female, who is here today complaining of 4-5 days of left side pain. On 12/27/15 night she noted pain on left side,she points to left waist and low back.  Pain is achy, initially 10/10 and now 5-6/10, mildly radiated to left flank, constant, exacerbated by walking and movement. Alleviated by rest.    Monday 12/29/15 was worse but started getting better Tuesday. OTC Cranberry, She has not tried any OTC analgesic. She denies any injury or unusual physical activity. She works part-time at [employer].  She denies Hx of nephrolithiasis.   She has not noted fever, chills, respiratory symptoms, dysuria, hematuria, urinary frequency, or decreased urine output.  Denies decreased appetite or changes in her diet, nausea, vomiting, blood in stool or melena.  Bowel movements decreased in frequency from 2-3 times daily to 2 times daily in the past couple days and stool seems "harder."   Pain is not affected by food intake. She denies any prior Hx of similar pain.  Former smoker.   Hx of DM II, states that BS's "under control"   Lab Results  Component Value Date   CREATININE 0.88 08/12/2015   BUN 18 08/12/2015   NA 138 08/12/2015   K 4.9 08/12/2015   CL 101 08/12/2015   CO2 29 08/12/2015   Hx of OA mainly right shoulder pain, has seen ortho in the past.  Lumbar MRI 05/2000: 1.  L1-2 MILD DIFFUSE BULGE. 2.  L2-3 MILD DIFFUSE BROAD-BASED BULGE, PROMINENT RIGHT POSTEROLATERALLY. 3.  L3-4 RIGHT POSTEROLATERAL TO RIGHT FAR LATERAL DISC PROTRUSION WITH MULTIFACTORIAL MODERATELY SEVERE SPINAL CANAL STENOSIS AND RIGHT NEURAL FORAMINAL STENOSIS. 4.  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 5.  L5-S1 MILD CENTRAL BROAD-BASED BULGE.    Review of Systems  Constitutional: Negative for activity change, appetite change, chills, fatigue, fever and unexpected weight change.  HENT: Negative for trouble swallowing and voice change.   Respiratory: Negative for cough, shortness of breath and wheezing.   Cardiovascular: Negative for palpitations and leg swelling.  Gastrointestinal: Positive for constipation (mild). Negative for abdominal pain, blood in stool, nausea and vomiting.  Genitourinary: Negative for decreased urine volume, difficulty urinating, dysuria, hematuria and pelvic pain.  Musculoskeletal: Positive for arthralgias (R shoulder/chronic) and back pain. Negative for gait problem and neck pain.  Skin: Negative for color change and rash.  Neurological: Negative for syncope, weakness and headaches.  Hematological: Negative for adenopathy. Does not bruise/bleed easily.  Psychiatric/Behavioral: Negative for confusion. The patient is nervous/anxious.       Current Outpatient Prescriptions on File Prior to Visit  Medication Sig Dispense Refill  . albuterol (PROVENTIL HFA;VENTOLIN HFA) 108 (90 BASE) MCG/ACT inhaler Inhale 2 puffs into the lungs every 6 (six) hours as needed for wheezing or shortness of breath. 1 Inhaler 0  . aspirin 81 MG tablet Take 1 tablet (81 mg total) by mouth daily. 30 tablet   . atorvastatin (LIPITOR) 40 MG tablet TAKE ONE TABLET BY MOUTH ONCE DAILY 30 tablet 5  . benazepril-hydrochlorthiazide (LOTENSIN HCT) 20-12.5 MG tablet TAKE ONE TABLET BY MOUTH ONCE DAILY 30 tablet 5  . bismuth-metronidazole-tetracycline (PYLERA) 140-125-125 MG capsule Take 3 capsules by mouth 4 (  four) times daily -  before meals and at bedtime. 120 capsule 0  . Blood Glucose Monitoring Suppl (FREESTYLE FREEDOM LITE) W/DEVICE KIT As directed , dx code 250.00 1 each 0  . Cholecalciferol (VITAMIN D3) 2000 units TABS Take 1 capsule by mouth daily.    . fluticasone  (FLONASE) 50 MCG/ACT nasal spray Place 2 sprays into both nostrils daily. 16 g 0  . glimepiride (AMARYL) 4 MG tablet TAKE ONE TABLET BY MOUTH ONCE DAILY BEFORE BREAKFAST 30 tablet 5  . glucose blood (FREESTYLE LITE) test strip Test once daily. 100 each 3  . ketorolac (ACULAR) 0.5 % ophthalmic solution Place 1 drop into the left eye 4 (four) times daily.     . Lancets (FREESTYLE) lancets Test once daily. 100 each 5  . metFORMIN (GLUCOPHAGE) 1000 MG tablet TAKE ONE TABLET BY MOUTH TWICE DAILY WITH MEALS 60 tablet 5  . Multiple Vitamins-Calcium (ONE-A-DAY WOMENS FORMULA PO) Take 1 tablet by mouth daily.    Marland Kitchen ofloxacin (OCUFLOX) 0.3 % ophthalmic solution Place 1 drop into the left eye 2 (two) times daily.     Marland Kitchen omeprazole (PRILOSEC) 20 MG capsule Take 1 capsule (20 mg total) by mouth 2 (two) times daily before a meal. 20 capsule 0  . pantoprazole (PROTONIX) 40 MG tablet Take 1 tablet (40 mg total) by mouth daily. 30 tablet 2  . pioglitazone (ACTOS) 45 MG tablet TAKE ONE TABLET BY MOUTH ONCE DAILY 30 tablet 5  . Polyethyl Glycol-Propyl Glycol (SYSTANE OP) Apply to eye as needed. As directed for dry eyes    . prednisoLONE acetate (PRED FORTE) 1 % ophthalmic suspension Place 1 drop into the left eye 2 (two) times daily.     . Wheat Dextrin (BENEFIBER PO) Take 3 capsules by mouth as needed.      No current facility-administered medications on file prior to visit.      Past Medical History:  Diagnosis Date  . Abdominal pain    in past  . Barrett esophagus 12/12/2000   egd bx  . Diabetes mellitus   . Diverticulosis   . Diverticulosis of colon (without mention of hemorrhage)   . GERD (gastroesophageal reflux disease)   . Hyperlipidemia   . Hypertension   . Internal hemorrhoids without mention of complication   . Osteoarthritis    Allergies  Allergen Reactions  . Penicillins     WHELTS    Social History   Social History  . Marital status: Married    Spouse name: N/A  . Number of  children: N/A  . Years of education: N/A   Social History Main Topics  . Smoking status: Former Smoker    Types: Cigarettes    Quit date: 03/08/1998  . Smokeless tobacco: Never Used  . Alcohol use No  . Drug use: No  . Sexual activity: Not Asked   Other Topics Concern  . None   Social History Narrative   Father age 41 complications of cardiac and cerebrovascular disease          Vitals:   01/01/16 1029  BP: 110/72  Pulse: 75  Resp: 12   O2 sat 96% at RA  Body mass index is 39.06 kg/m.    Physical Exam  Nursing note and vitals reviewed. Constitutional: She is oriented to person, place, and time. She appears well-developed. She does not appear ill. No distress.  HENT:  Head: Atraumatic.  Eyes: Conjunctivae are normal.  Cardiovascular: Normal rate and regular rhythm.  DP pulses present bilateral  Respiratory: Effort normal and breath sounds normal. No respiratory distress.  GI: Soft. She exhibits no mass. There is no hepatomegaly. There is no tenderness. There is no CVA tenderness.  Musculoskeletal: She exhibits no edema.       Back:  + mild tenderness upon palpation of muscle left low back, post rib cage, and lateral. muscles. Pain elicited with movement on exam table during examination.    Lymphadenopathy:       Right: No supraclavicular adenopathy present.       Left: No supraclavicular adenopathy present.  Neurological: She is alert and oriented to person, place, and time. She has normal strength. Coordination normal.  SLR negative bilateral. Stable gait, mildly antalgic, no assistance needed.  Skin: Skin is warm. No rash noted. No erythema.  Psychiatric: She has a normal mood and affect.  Well groomed, good eye contact.      ASSESSMENT AND PLAN:     Briana Patton was seen today for left side pain.  Diagnoses and all orders for this visit:  Acute left-sided low back pain without sciatica  Improving. She has Hx of lumbar DDD, which could explain  pain. Recommend OTC topical medication: Icy hot or Aspercreme with lidocaine (affected area. She was on Mobic in the past, no history of CKD, and well tolerated so recommend one or 2 tablets daily for 1-2 weeks. Also Zanaflex 4 mg at bedtime. We discussed some side effects of these medications. Instructed about warning signs, monitor for new associated symptoms. Because no history of injury/trauma I don't think imaging is needed today. Work excuse given.   -     meloxicam (MOBIC) 7.5 MG tablet; Take 1 tablet (7.5 mg total) by mouth daily. 1-2 tabs daily -     tiZANidine (ZANAFLEX) 4 MG tablet; Take 0.5-1 tablets (2-4 mg total) by mouth at bedtime as needed for muscle spasms. -     POCT urinalysis dipstick   Acute left flank pain  Referred pain from left lower back.  We discussed possible causes.  -06/2011 colonoscopy with moderate diverticulosis in the sigmoid and descending colon. -No urinary symptoms and pain characteristics do not suggest nephrolithiasis.  Abdominal examination otherwise normal, could also be musculoskeletal. If pain continues further workup is going to be necessary.  -     Urinalysis, microscopic only  Other microscopic hematuria  Urine dip today + BLD, will send to microscopic and further recommendations would be given accordingly.  -     Urinalysis, microscopic only      Return if symptoms worsen or fail to improve.     -[name] was advised to return or notify a doctor immediately if symptoms worsen or persist or new concerns arise, she voices understanding and agrees with plan.        G. Martinique, MD  [facility]. [place] office.

## 2016-01-16 ENCOUNTER — Other Ambulatory Visit: Payer: Self-pay | Admitting: Internal Medicine

## 2016-01-16 ENCOUNTER — Ambulatory Visit (INDEPENDENT_AMBULATORY_CARE_PROVIDER_SITE_OTHER): Payer: PPO | Admitting: Sports Medicine

## 2016-01-16 ENCOUNTER — Other Ambulatory Visit: Payer: Self-pay | Admitting: Family Medicine

## 2016-01-16 ENCOUNTER — Encounter (INDEPENDENT_AMBULATORY_CARE_PROVIDER_SITE_OTHER): Payer: Self-pay | Admitting: Sports Medicine

## 2016-01-16 VITALS — BP 123/58 | HR 58 | Ht 59.0 in | Wt 193.0 lb

## 2016-01-16 DIAGNOSIS — M25511 Pain in right shoulder: Secondary | ICD-10-CM

## 2016-01-16 DIAGNOSIS — M7501 Adhesive capsulitis of right shoulder: Secondary | ICD-10-CM | POA: Diagnosis not present

## 2016-01-16 DIAGNOSIS — G8929 Other chronic pain: Secondary | ICD-10-CM | POA: Diagnosis not present

## 2016-01-16 DIAGNOSIS — M545 Low back pain, unspecified: Secondary | ICD-10-CM

## 2016-01-16 MED ORDER — AMITRIPTYLINE HCL 25 MG PO TABS: 25.0000 mg | ORAL_TABLET | Freq: Every day | ORAL | 1 refills | Status: DC

## 2016-01-16 NOTE — Progress Notes (Signed)
Briana Patton - 75 y.o. female MRN [id number]  Date of birth: 1940/07/24  Office Visit Note: Visit Date: 01/16/2016 PCP: Rogelia Boga, MD Referred by: Gordy Savers, MD  Subjective: Chief Complaint  Patient presents with  . Right Shoulder - Pain  . Follow-up   HPI: Patient states injection helped some, but states she is doing better.  Not currently going to physical therapy.  States right shoulder still hurts, left shoulder is starting to hurt.    Continues to have pain especially with any type of internal rotation. Occasionally awakening at night due to this discomfort. Pain does not radiate down into the arm. She is having mutations & above shoulder motion.    ROS Otherwise per HPI.  Assessment & Plan: Visit Diagnoses:  1. Chronic right shoulder pain   2. Adhesive capsulitis of right shoulder     Plan: Findings:  Right shoulder improved from the pain standpoint however she is having some loss of range of motion. Intra-articular injection offered today for early frozen shoulder she like to defer. We'll go ahead & start her on amitriptyline & have her follow-up in 6 weeks for clinical reevaluation. If persistent worsening of symptoms will need to discuss again intra-articular injection. I'm happy to see her sooner if needed. [facility] shoulder conditioning program emphasizing importance of range of motion was discussed. Patient does not interested in formal physical therapy due to cost at this time.    Meds & Orders:  Meds ordered this encounter  Medications  . amitriptyline (ELAVIL) 25 MG tablet    Sig: Take 1 tablet (25 mg total) by mouth at bedtime.    Dispense:  30 tablet    Refill:  1   No orders of the defined types were placed in this encounter.   Follow-up: Return in about 6 weeks (around 02/27/2016).   Procedures: No procedures performed  No notes on file   Clinical History: No specialty comments available.  She reports that she quit smoking  about 17 years ago. Her smoking use included Cigarettes. She has never used smokeless tobacco.   Recent Labs  04/16/15 0935 08/12/15 1048  HGBA1C 6.2 6.3    Objective:  VS:  HT:4\' 11"  (149.9 cm)   WT:193 lb (87.5 kg)  BMI:39.1    BP:(!) 123/58  HR:(!) 58bpm  TEMP: ( )  RESP:  Physical Exam  Constitutional:  Adult female. No acute distress. Alert & appropriate. Vital signs reviewed. Bilateral upper trapezius overall well aligned. She does have marked limitation in external rotation internal rotation & abduction of the right shoulder compared to the left. Her arm squeeze test is negative bilaterally. She's only minimal pain with brachial plexus squeeze.    Ortho Exam Imaging: No results found.  Past Medical/Family/Surgical/Social History: Medications & Allergies reviewed per EMR Patient Active Problem List   Diagnosis Date Noted  . GERD (gastroesophageal reflux disease) 06/03/2011  . Short-segment Barrett's esophagus 06/03/2011  . IBS (irritable bowel syndrome) 06/03/2011  . BLOOD CHEMISTRY, ABNORMAL 04/03/2010  . CHEST PAIN 04/02/2010  . VERTIGO, PERIPHERAL 01/17/2009  . GERD 06/25/2008  . Osteoarthritis 09/04/2007  . PALPITATIONS, OCCASIONAL 09/04/2007  . URI 03/24/2007  . Diabetes mellitus without complication (HCC) 07/26/2006  . Dyslipidemia 07/26/2006  . Essential hypertension 07/26/2006  . DIVERTICULOSIS, COLON 07/26/2006  . ARTHROSCOPY, LEFT KNEE, HX OF 07/26/2006   Past Medical History:  Diagnosis Date  . Abdominal pain    in past  . Barrett esophagus 12/12/2000   egd bx  .  Diabetes mellitus   . Diverticulosis   . Diverticulosis of colon (without mention of hemorrhage)   . GERD (gastroesophageal reflux disease)   . Hyperlipidemia   . Hypertension   . Internal hemorrhoids without mention of complication   . Osteoarthritis    Family History  Problem Relation Age of Onset  . Diabetes Brother   . Diabetes Mother   . Diabetes Father   . Stroke Father     . Alcohol abuse Sister     X44  . Epilepsy Brother   . Colon cancer Neg Hx   . Esophageal cancer Neg Hx   . Rectal cancer Neg Hx   . Stomach cancer Neg Hx    Past Surgical History:  Procedure Laterality Date  . ABDOMINAL HYSTERECTOMY    . CESAREAN SECTION     3 times  . COLONOSCOPY    . KNEE ARTHROSCOPY     left knee surgery 1999  . LUMBAR LAMINECTOMY    . UPPER GASTROINTESTINAL ENDOSCOPY     Social History   Occupational History  . Not on file.   Social History Main Topics  . Smoking status: Former Smoker    Types: Cigarettes    Quit date: 03/08/1998  . Smokeless tobacco: Never Used  . Alcohol use No  . Drug use: No  . Sexual activity: Not on file

## 2016-01-19 NOTE — Telephone Encounter (Signed)
She can follow with PCP as recommended, she can discuss chronic use of these medications if necessary, my purpose was to prescribe medications for short period of time and hoping symptoms will resolve. She was instructed to follow with PCP if needed.  Thanks, BJ

## 2016-01-19 NOTE — Telephone Encounter (Signed)
Do you want her to continue the Tizanidine? The Meloxicam is prescribed by Dr. Raliegh Ip.

## 2016-01-20 NOTE — Addendum Note (Signed)
Addended by: Kateri Mc E on: 01/20/2016 07:45 AM   Modules accepted: Orders

## 2016-02-11 ENCOUNTER — Ambulatory Visit: Payer: PPO | Admitting: Internal Medicine

## 2016-02-16 ENCOUNTER — Other Ambulatory Visit: Payer: Self-pay | Admitting: Internal Medicine

## 2016-02-16 DIAGNOSIS — M7501 Adhesive capsulitis of right shoulder: Secondary | ICD-10-CM

## 2016-02-16 DIAGNOSIS — M25511 Pain in right shoulder: Principal | ICD-10-CM

## 2016-02-16 DIAGNOSIS — G8929 Other chronic pain: Secondary | ICD-10-CM

## 2016-02-16 MED ORDER — OMEPRAZOLE 20 MG PO CPDR: 20.0000 mg | DELAYED_RELEASE_CAPSULE | Freq: Two times a day (BID) | ORAL | 4 refills | Status: DC

## 2016-02-16 MED ORDER — AMITRIPTYLINE HCL 25 MG PO TABS: 25.0000 mg | ORAL_TABLET | Freq: Every day | ORAL | 2 refills | Status: DC

## 2016-02-16 MED ORDER — ATORVASTATIN CALCIUM 40 MG PO TABS: 40.0000 mg | ORAL_TABLET | Freq: Every day | ORAL | 2 refills | Status: DC

## 2016-02-16 NOTE — Telephone Encounter (Signed)
Rx sent to pharmacy as requested.

## 2016-02-16 NOTE — Telephone Encounter (Signed)
° ° ° ° °  Pt request refill of the following:  Pt call to say she need 90 day supply on the medicines that was called in last month to her pharmacy   Phamacy: [facility]

## 2016-02-19 ENCOUNTER — Other Ambulatory Visit: Payer: Self-pay | Admitting: Internal Medicine

## 2016-02-19 ENCOUNTER — Telehealth: Payer: Self-pay

## 2016-02-19 DIAGNOSIS — M25511 Pain in right shoulder: Principal | ICD-10-CM

## 2016-02-19 DIAGNOSIS — M7501 Adhesive capsulitis of right shoulder: Secondary | ICD-10-CM

## 2016-02-19 DIAGNOSIS — G8929 Other chronic pain: Secondary | ICD-10-CM

## 2016-02-19 NOTE — Telephone Encounter (Signed)
Received PA request from [facility] for Amitriptyline 25 mg tablets. PA submitted & is pending. Key: [id number]

## 2016-02-23 NOTE — Telephone Encounter (Signed)
PA Approved, form faxed back to pharmacy. 

## 2016-03-03 ENCOUNTER — Ambulatory Visit (INDEPENDENT_AMBULATORY_CARE_PROVIDER_SITE_OTHER): Payer: PPO | Admitting: Orthopedic Surgery

## 2016-03-09 ENCOUNTER — Ambulatory Visit: Payer: PPO | Admitting: Internal Medicine

## 2016-03-10 ENCOUNTER — Ambulatory Visit: Payer: PPO | Admitting: Internal Medicine

## 2016-03-25 ENCOUNTER — Ambulatory Visit (INDEPENDENT_AMBULATORY_CARE_PROVIDER_SITE_OTHER): Payer: PPO | Admitting: Orthopedic Surgery

## 2016-04-13 ENCOUNTER — Telehealth (INDEPENDENT_AMBULATORY_CARE_PROVIDER_SITE_OTHER): Payer: Self-pay | Admitting: Radiology

## 2016-04-13 NOTE — Telephone Encounter (Signed)
IC pt and could not leave message, IC husband, he will have her call me.  I need to move her appt down a little in the day.

## 2016-04-14 ENCOUNTER — Encounter (INDEPENDENT_AMBULATORY_CARE_PROVIDER_SITE_OTHER): Payer: Self-pay | Admitting: Orthopedic Surgery

## 2016-04-14 ENCOUNTER — Ambulatory Visit (INDEPENDENT_AMBULATORY_CARE_PROVIDER_SITE_OTHER): Payer: PPO | Admitting: Orthopedic Surgery

## 2016-04-14 DIAGNOSIS — M25511 Pain in right shoulder: Secondary | ICD-10-CM | POA: Diagnosis not present

## 2016-04-14 DIAGNOSIS — M542 Cervicalgia: Secondary | ICD-10-CM | POA: Diagnosis not present

## 2016-04-14 DIAGNOSIS — G8929 Other chronic pain: Secondary | ICD-10-CM | POA: Diagnosis not present

## 2016-04-14 NOTE — Progress Notes (Signed)
Office Visit Note   Patient: Briana Patton           Date of Birth: 1940-07-07           MRN: [id number] Visit Date: 04/14/2016 Requested by: Briana Savers, MD [address] [place], [place] [address] PCP: Briana Boga, MD  Subjective: Chief Complaint  Patient presents with  . Neck - Pain  . Right Shoulder - Pain, Follow-up    HPI Briana Patton is a 76 year old patient with right shoulder pain.  She's been seen before by Briana Patton.  Here for 6 week recheck on right shoulder.  Date of injury initially 11/13/2015 and she is picking up an item at work.  She has been doing home exercise.  Took [place] at night and it did not help.  She had a cortisone injection 12/05/2015 which also didn't help too much.  By report she has radiographs of the shoulder and neck on canopy.  Because of difficulty retrieving she states that they were normal.  She reports having some increased neck pain lately.  Left arm hurts him as well.  Patient states she may once been recommended to have surgery on the neck.  Her shoulder region has been hurting for a year.  She does also report some numbness and tingling in both hands.             Review of Systems All systems reviewed are negative as they relate to the chief complaint within the history of present illness.  Patient denies  fevers or chills.    Assessment & Plan: Visit Diagnoses:  1. Chronic right shoulder pain   2. Cervicalgia     Plan: Impression is right frozen shoulder diabetic patient.  This is going to be a refractory process.  Patient may also have some carpal tunnel syndrome based on her symptoms.  I would favor at this time course of physical therapy.  We discussed an injection she willf wait it out on that.  We will try physical therapy for the right shoulder.  Okay for her to work at her current level. avulsive going to get a nerve study on that right hand to evaluate for carpal tunnel syndrome.  I don't think any this is coming  from her neck but that would be a possibility as well see her back after that study   Follow-Up Instructions: No Follow-up on file.   Orders:  No orders of the defined types were placed in this encounter.  No orders of the defined types were placed in this encounter.     Procedures: No procedures performed   Clinical Data: No additional findings.  Objective: Vital Signs: There were no vitals taken for this visit.  Physical Exam   Constitutional: Patient appears well-developed HEENT:  Head: Normocephalic Eyes:EOM are normal Neck: Normal range of motion Cardiovascular: Normal rate Pulmonary/chest: Effort normal Neurologic: Patient is alert Skin: Skin is warm Psychiatric: Patient has normal mood and affect    Ortho Exam orthopedic exam demonstrates increased body mass index pretty normal gait and alignment good cervical spine range of motion good neck range of motion flexion extension rotation.  No real neck tenderness to palpation.  Motor sensory function in both hands intact.  Does have restricted wrist range of motion right more than left with flexion and extension.  Radial pulses intact bilaterally.  Patient does have restricted motion in the flexion extension plane.  She also has pretty restricted shoulder range of motion with forward flexion and  abduction about 70 and 70 on the right compared to 170 and 120 on the left external rotation at 15 of abduction is about 30 on the  right compared to 60 on the left.    Specialty Comments:  No specialty comments available.  Imaging: No results found.   PMFS History: Patient Active Problem List   Diagnosis Date Noted  . Chronic right shoulder pain 04/14/2016  . Cervicalgia 04/14/2016  . GERD (gastroesophageal reflux disease) 06/03/2011  . Short-segment Barrett's esophagus 06/03/2011  . IBS (irritable bowel syndrome) 06/03/2011  . BLOOD CHEMISTRY, ABNORMAL 04/03/2010  . CHEST PAIN 04/02/2010  . VERTIGO, PERIPHERAL  01/17/2009  . GERD 06/25/2008  . Osteoarthritis 09/04/2007  . PALPITATIONS, OCCASIONAL 09/04/2007  . URI 03/24/2007  . Diabetes mellitus without complication (HCC) 07/26/2006  . Dyslipidemia 07/26/2006  . Essential hypertension 07/26/2006  . DIVERTICULOSIS, COLON 07/26/2006  . ARTHROSCOPY, LEFT KNEE, HX OF 07/26/2006   Past Medical History:  Diagnosis Date  . Abdominal pain    in past  . Barrett esophagus 12/12/2000   egd bx  . Diabetes mellitus   . Diverticulosis   . Diverticulosis of colon (without mention of hemorrhage)   . GERD (gastroesophageal reflux disease)   . Hyperlipidemia   . Hypertension   . Internal hemorrhoids without mention of complication   . Osteoarthritis     Family History  Problem Relation Age of Onset  . Diabetes Brother   . Diabetes Mother   . Diabetes Father   . Stroke Father   . Alcohol abuse Sister     X89  . Epilepsy Brother   . Colon cancer Neg Hx   . Esophageal cancer Neg Hx   . Rectal cancer Neg Hx   . Stomach cancer Neg Hx     Past Surgical History:  Procedure Laterality Date  . ABDOMINAL HYSTERECTOMY    . CESAREAN SECTION     3 times  . COLONOSCOPY    . KNEE ARTHROSCOPY     left knee surgery 1999  . LUMBAR LAMINECTOMY    . UPPER GASTROINTESTINAL ENDOSCOPY     Social History   Occupational History  . Not on file.   Social History Main Topics  . Smoking status: Former Smoker    Types: Cigarettes    Quit date: 03/08/1998  . Smokeless tobacco: Never Used  . Alcohol use No  . Drug use: No  . Sexual activity: Not on file

## 2016-04-21 DIAGNOSIS — M7501 Adhesive capsulitis of right shoulder: Secondary | ICD-10-CM | POA: Diagnosis not present

## 2016-04-28 DIAGNOSIS — M7501 Adhesive capsulitis of right shoulder: Secondary | ICD-10-CM | POA: Diagnosis not present

## 2016-04-30 ENCOUNTER — Other Ambulatory Visit: Payer: Self-pay | Admitting: Internal Medicine

## 2016-04-30 ENCOUNTER — Encounter (INDEPENDENT_AMBULATORY_CARE_PROVIDER_SITE_OTHER): Payer: Self-pay | Admitting: Physical Medicine and Rehabilitation

## 2016-04-30 ENCOUNTER — Ambulatory Visit (INDEPENDENT_AMBULATORY_CARE_PROVIDER_SITE_OTHER): Payer: PPO | Admitting: Physical Medicine and Rehabilitation

## 2016-04-30 DIAGNOSIS — M7501 Adhesive capsulitis of right shoulder: Secondary | ICD-10-CM

## 2016-04-30 DIAGNOSIS — G8929 Other chronic pain: Secondary | ICD-10-CM

## 2016-04-30 DIAGNOSIS — M25511 Pain in right shoulder: Principal | ICD-10-CM

## 2016-04-30 DIAGNOSIS — R202 Paresthesia of skin: Secondary | ICD-10-CM

## 2016-04-30 NOTE — Progress Notes (Signed)
Briana Patton - 76 y.o. female MRN [id number]  Date of birth: [birth year]  Office Visit Note: Visit Date: 04/30/2016 PCP: Rogelia Boga, MD Referred by: Gordy Savers, MD  Subjective: Chief Complaint  Patient presents with  . Right Hand - Numbness   HPI: This is likely there is a 76 year old right-hand dominant female. She reports right shoulder injury in September of last year and continued physical therapy for this. She is getting numbness in the fingers of the right hand particularly the second digit. This can come and go and is intermittent. She does not endorse radicular complaints down to the finger. She has no left-sided complaints. Her symptoms seem to be worse at night and with activity. She's had no prior electrodiagnostic studies are carpal tunnel release.    Right hand dominant Right shoulder injury- doing PT for that currently. Numbness in fingers of right hand- second finger is the worst. Comes and goes.   ROS Otherwise per HPI.  Assessment & Plan: Visit Diagnoses:  1. Paresthesia of skin     Plan: No additional findings.  Impression: The above electrodiagnostic study is ABNORMAL and reveals evidence of a mild right median nerve entrapment at the wrist (carpal tunnel syndrome) affecting sensory components.   There is no significant electrodiagnostic evidence of any other focal nerve entrapment or brachial plexopathy.   As you know, this particular electrodiagnostic study cannot rule out chemical radiculitis or sensory only radiculopathy.  Recommendations: 1. Follow-up with referring physician. May consider diagnostic injection.   Meds & Orders: No orders of the defined types were placed in this encounter.   Orders Placed This Encounter  Procedures  . NCV with EMG (electromyography)    Follow-up: Return for scheduled follow up with Dr. August Saucer.   Procedures: No procedures performed  EMG & NCV Findings: Evaluation of the right median (across  palm) sensory nerve showed prolonged distal peak latency (Wrist, 3.7 ms).  All remaining nerves (as indicated in the following tables) were within normal limits.    All examined muscles (as indicated in the following table) showed no evidence of electrical instability.    Impression: The above electrodiagnostic study is ABNORMAL and reveals evidence of a mild right median nerve entrapment at the wrist (carpal tunnel syndrome) affecting sensory components.   There is no significant electrodiagnostic evidence of any other focal nerve entrapment or brachial plexopathy  Recommendations: 1. Follow-up with referring physician. May consider diagnostic injection.   Nerve Conduction Studies Anti Sensory Summary Table   Stim Site NR Peak (ms) Norm Peak (ms) P-T Amp (V) Norm P-T Amp Site1 Site2 Delta-P (ms) Dist (cm) Vel (m/s) Norm Vel (m/s)  Right Median Acr Palm Anti Sensory (2nd Digit)  33C  Wrist    *3.7 <3.6 23.7 >10 Wrist Palm 1.9 0.0    Palm    1.8 <2.0 1.7         Right Radial Anti Sensory (Base 1st Digit)  33C  Wrist    1.8 <3.1 19.0  Wrist Base 1st Digit 1.8 0.0    Right Ulnar Anti Sensory (5th Digit)  33.1C  Wrist    2.9 <3.7 21.0 >15.0 Wrist 5th Digit 2.9 14.0 48 >38   Motor Summary Table   Stim Site NR Onset (ms) Norm Onset (ms) O-P Amp (mV) Norm O-P Amp Site1 Site2 Delta-0 (ms) Dist (cm) Vel (m/s) Norm Vel (m/s)  Right Median Motor (Abd Poll Brev)  33.2C  Wrist    3.5 <4.2 7.2 >5 Elbow Wrist  3.8 20.5 54 >50  Elbow    7.3  5.2         Right Ulnar Motor (Abd Dig Min)  33.1C  Wrist    2.8 <4.2 8.0 >3 B Elbow Wrist 3.0 19.5 65 >53  B Elbow    5.8  7.8  A Elbow B Elbow 1.5 10.0 67 >53  A Elbow    7.3  3.7          EMG   Side Muscle Nerve Root Ins Act Fibs Psw Amp Dur Poly Recrt Int Dennie Bible Comment  Right Abd Poll Brev Median C8-T1 Nml Nml Nml Nml Nml 0 Nml Nml   Right 1stDorInt Ulnar C8-T1 Nml Nml Nml Nml Nml 0 Nml Nml   Right PronatorTeres Median C6-7 Nml Nml Nml Nml Nml 0  Nml Nml     Nerve Conduction Studies Anti Sensory Left/Right Comparison   Stim Site L Lat (ms) R Lat (ms) L-R Lat (ms) L Amp (V) R Amp (V) L-R Amp (%) Site1 Site2 L Vel (m/s) R Vel (m/s) L-R Vel (m/s)  Median Acr Palm Anti Sensory (2nd Digit)  33C  Wrist  *75   23.7  Wrist Palm     Palm  1.8   1.7        Radial Anti Sensory (Base 1st Digit)  33C  Wrist  1.8   19.0  Wrist Base 1st Digit     Ulnar Anti Sensory (5th Digit)  33.1C  Wrist  2.9   21.0  Wrist 5th Digit  48    Motor Left/Right Comparison   Stim Site L Lat (ms) R Lat (ms) L-R Lat (ms) L Amp (mV) R Amp (mV) L-R Amp (%) Site1 Site2 L Vel (m/s) R Vel (m/s) L-R Vel (m/s)  Median Motor (Abd Poll Brev)  33.2C  Wrist  3.5   7.2  Elbow Wrist  54   Elbow  7.3   5.2        Ulnar Motor (Abd Dig Min)  33.1C  Wrist  2.8   8.0  B Elbow Wrist  65   B Elbow  5.8   7.8  A Elbow B Elbow  67   A Elbow  7.3   3.7           Clinical History: No specialty comments available.  She reports that she quit smoking about 18 years ago. Her smoking use included Cigarettes. She has never used smokeless tobacco.   Recent Labs  08/12/15 1048  HGBA1C 6.3    Objective:  VS:  HT:    WT:   BMI:     BP:   HR: bpm  TEMP: ( )  RESP:  Physical Exam  Musculoskeletal:  Inspection reveals no atrophy of the bilateral APB or FDI or hand intrinsics. There is no swelling, color changes, allodynia or dystrophic changes. There is 5 out of 5 strength in the bilateral wrist extension, finger abduction and long finger flexion. There is intact sensation to light touch in all dermatomal and peripheral nerve distributions. There is a negative Hoffmann's test bilaterally.    Ortho Exam Imaging: No results found.  Past Medical/Family/Surgical/Social History: Medications & Allergies reviewed per EMR Patient Active Problem List   Diagnosis Date Noted  . Chronic right shoulder pain 04/14/2016  . Cervicalgia 04/14/2016  . GERD (gastroesophageal reflux  disease) 06/03/2011  . Short-segment Barrett's esophagus 06/03/2011  . IBS (irritable bowel syndrome) 06/03/2011  . BLOOD CHEMISTRY, ABNORMAL 04/03/2010  .  CHEST PAIN 04/02/2010  . VERTIGO, PERIPHERAL 01/17/2009  . GERD 06/25/2008  . Osteoarthritis 09/04/2007  . PALPITATIONS, OCCASIONAL 09/04/2007  . URI 03/24/2007  . Diabetes mellitus without complication (HCC) 07/26/2006  . Dyslipidemia 07/26/2006  . Essential hypertension 07/26/2006  . DIVERTICULOSIS, COLON 07/26/2006  . ARTHROSCOPY, LEFT KNEE, HX OF 07/26/2006   Past Medical History:  Diagnosis Date  . Abdominal pain    in past  . Barrett esophagus 12/12/2000   egd bx  . Diabetes mellitus   . Diverticulosis   . Diverticulosis of colon (without mention of hemorrhage)   . GERD (gastroesophageal reflux disease)   . Hyperlipidemia   . Hypertension   . Internal hemorrhoids without mention of complication   . Osteoarthritis    Family History  Problem Relation Age of Onset  . Diabetes Brother   . Diabetes Mother   . Diabetes Father   . Stroke Father   . Alcohol abuse Sister     X56  . Epilepsy Brother   . Colon cancer Neg Hx   . Esophageal cancer Neg Hx   . Rectal cancer Neg Hx   . Stomach cancer Neg Hx    Past Surgical History:  Procedure Laterality Date  . ABDOMINAL HYSTERECTOMY    . CESAREAN SECTION     3 times  . COLONOSCOPY    . KNEE ARTHROSCOPY     left knee surgery 1999  . LUMBAR LAMINECTOMY    . UPPER GASTROINTESTINAL ENDOSCOPY     Social History   Occupational History  . Not on file.   Social History Main Topics  . Smoking status: Former Smoker    Types: Cigarettes    Quit date: 03/08/1998  . Smokeless tobacco: Never Used  . Alcohol use No  . Drug use: No  . Sexual activity: Not on file

## 2016-05-04 NOTE — Procedures (Signed)
EMG & NCV Findings: Evaluation of the right median (across palm) sensory nerve showed prolonged distal peak latency (Wrist, 3.7 ms).  All remaining nerves (as indicated in the following tables) were within normal limits.    All examined muscles (as indicated in the following table) showed no evidence of electrical instability.    Impression: The above electrodiagnostic study is ABNORMAL and reveals evidence of a mild right median nerve entrapment at the wrist (carpal tunnel syndrome) affecting sensory components.   There is no significant electrodiagnostic evidence of any other focal nerve entrapment or brachial plexopathy  Recommendations: 1. Follow-up with referring physician. May consider diagnostic injection.   Nerve Conduction Studies Anti Sensory Summary Table   Stim Site NR Peak (ms) Norm Peak (ms) P-T Amp (V) Norm P-T Amp Site1 Site2 Delta-P (ms) Dist (cm) Vel (m/s) Norm Vel (m/s)  Right Median Acr Palm Anti Sensory (2nd Digit)  33C  Wrist    *3.7 <3.6 23.7 >10 Wrist Palm 1.9 0.0    Palm    1.8 <2.0 1.7         Right Radial Anti Sensory (Base 1st Digit)  33C  Wrist    1.8 <3.1 19.0  Wrist Base 1st Digit 1.8 0.0    Right Ulnar Anti Sensory (5th Digit)  33.1C  Wrist    2.9 <3.7 21.0 >15.0 Wrist 5th Digit 2.9 14.0 48 >38   Motor Summary Table   Stim Site NR Onset (ms) Norm Onset (ms) O-P Amp (mV) Norm O-P Amp Site1 Site2 Delta-0 (ms) Dist (cm) Vel (m/s) Norm Vel (m/s)  Right Median Motor (Abd Poll Brev)  33.2C  Wrist    3.5 <4.2 7.2 >5 Elbow Wrist 3.8 20.5 54 >50  Elbow    7.3  5.2         Right Ulnar Motor (Abd Dig Min)  33.1C  Wrist    2.8 <4.2 8.0 >3 B Elbow Wrist 3.0 19.5 65 >53  B Elbow    5.8  7.8  A Elbow B Elbow 1.5 10.0 67 >53  A Elbow    7.3  3.7          EMG   Side Muscle Nerve Root Ins Act Fibs Psw Amp Dur Poly Recrt Int Fraser Din Comment  Right Abd Poll Brev Median C8-T1 Nml Nml Nml Nml Nml 0 Nml Nml   Right 1stDorInt Ulnar C8-T1 Nml Nml Nml Nml Nml 0 Nml  Nml   Right PronatorTeres Median C6-7 Nml Nml Nml Nml Nml 0 Nml Nml     Nerve Conduction Studies Anti Sensory Left/Right Comparison   Stim Site L Lat (ms) R Lat (ms) L-R Lat (ms) L Amp (V) R Amp (V) L-R Amp (%) Site1 Site2 L Vel (m/s) R Vel (m/s) L-R Vel (m/s)  Median Acr Palm Anti Sensory (2nd Digit)  33C  Wrist  *3.7   23.7  Wrist Palm     Palm  1.8   1.7        Radial Anti Sensory (Base 1st Digit)  33C  Wrist  1.8   19.0  Wrist Base 1st Digit     Ulnar Anti Sensory (5th Digit)  33.1C  Wrist  2.9   21.0  Wrist 5th Digit  48    Motor Left/Right Comparison   Stim Site L Lat (ms) R Lat (ms) L-R Lat (ms) L Amp (mV) R Amp (mV) L-R Amp (%) Site1 Site2 L Vel (m/s) R Vel (m/s) L-R Vel (m/s)  Median Motor (  Abd Poll Brev)  33.2C  Wrist  3.5   7.2  Elbow Wrist  54   Elbow  7.3   5.2        Ulnar Motor (Abd Dig Min)  33.1C  Wrist  2.8   8.0  B Elbow Wrist  65   B Elbow  5.8   7.8  A Elbow B Elbow  67   A Elbow  7.3   3.7

## 2016-05-05 ENCOUNTER — Ambulatory Visit (INDEPENDENT_AMBULATORY_CARE_PROVIDER_SITE_OTHER): Payer: PPO | Admitting: Internal Medicine

## 2016-05-05 ENCOUNTER — Encounter: Payer: Self-pay | Admitting: Internal Medicine

## 2016-05-05 VITALS — BP 132/68 | HR 64 | Temp 97.9°F | Ht 59.0 in | Wt 196.8 lb

## 2016-05-05 DIAGNOSIS — G8929 Other chronic pain: Secondary | ICD-10-CM | POA: Diagnosis not present

## 2016-05-05 DIAGNOSIS — M25511 Pain in right shoulder: Secondary | ICD-10-CM | POA: Diagnosis not present

## 2016-05-05 DIAGNOSIS — E119 Type 2 diabetes mellitus without complications: Secondary | ICD-10-CM

## 2016-05-05 DIAGNOSIS — E785 Hyperlipidemia, unspecified: Secondary | ICD-10-CM | POA: Diagnosis not present

## 2016-05-05 DIAGNOSIS — I10 Essential (primary) hypertension: Secondary | ICD-10-CM | POA: Diagnosis not present

## 2016-05-05 DIAGNOSIS — M7501 Adhesive capsulitis of right shoulder: Secondary | ICD-10-CM | POA: Diagnosis not present

## 2016-05-05 LAB — HEMOGLOBIN A1C: Hgb A1c MFr Bld: 6.2 % (ref 4.6–6.5)

## 2016-05-05 MED ORDER — GLIMEPIRIDE 4 MG PO TABS: 2.0000 mg | ORAL_TABLET | Freq: Every day | ORAL | 5 refills | Status: DC

## 2016-05-05 NOTE — Progress Notes (Signed)
Pre visit review using our clinic review tool, if applicable. No additional management support is needed unless otherwise documented below in the visit note. 

## 2016-05-05 NOTE — Patient Instructions (Signed)
Decrease glimepiride to   Half tablet daily  Limit your sodium (Salt) intake  Please check your blood pressure on a regular basis.  If it is consistently greater than 150/90, please make an office appointment.    It is important that you exercise regularly, at least 20 minutes 3 to 4 times per week.  If you develop chest pain or shortness of breath seek  medical attention.  You need to lose weight.  Consider a lower calorie diet and regular exercise.   Please check your hemoglobin A1c every 3-6 months

## 2016-05-05 NOTE — Progress Notes (Signed)
Subjective:    Patient ID: Briana Patton, female    DOB: [birth year], 76 y.o.   MRN: [id number]  HPI 76 year old patient who has type 2 diabetes that has been well-controlled on oral therapy.  She has dyslipidemia and essential hypertension. No hypoglycemia, but blood sugars are generally often in the seventies. She feels well today without concerns or complaints She has a history of glaucoma and is followed by ophthalmology closely  No new concerns or complaints Remains on PPI therapy for Barrett's esophagus  Past Medical History:  Diagnosis Date  . Abdominal pain    in past  . Barrett esophagus 12/12/2000   egd bx  . Diabetes mellitus   . Diverticulosis   . Diverticulosis of colon (without mention of hemorrhage)   . GERD (gastroesophageal reflux disease)   . Hyperlipidemia   . Hypertension   . Internal hemorrhoids without mention of complication   . Osteoarthritis      Social History   Social History  . Marital status: Married    Spouse name: N/A  . Number of children: N/A  . Years of education: N/A   Occupational History  . Not on file.   Social History Main Topics  . Smoking status: Former Smoker    Types: Cigarettes    Quit date: 03/08/1998  . Smokeless tobacco: Never Used  . Alcohol use No  . Drug use: No  . Sexual activity: Not on file   Other Topics Concern  . Not on file   Social History Narrative   Father age 75 complications of cardiac and cerebrovascular disease          Past Surgical History:  Procedure Laterality Date  . ABDOMINAL HYSTERECTOMY    . CESAREAN SECTION     3 times  . COLONOSCOPY    . KNEE ARTHROSCOPY     left knee surgery 1999  . LUMBAR LAMINECTOMY    . UPPER GASTROINTESTINAL ENDOSCOPY      Family History  Problem Relation Age of Onset  . Diabetes Brother   . Diabetes Mother   . Diabetes Father   . Stroke Father   . Alcohol abuse Sister     X25  . Epilepsy Brother   . Colon cancer Neg Hx   . Esophageal cancer Neg  Hx   . Rectal cancer Neg Hx   . Stomach cancer Neg Hx     Allergies  Allergen Reactions  . Penicillins     WHELTS    Current Outpatient Prescriptions on File Prior to Visit  Medication Sig Dispense Refill  . albuterol (PROVENTIL HFA;VENTOLIN HFA) 108 (90 BASE) MCG/ACT inhaler Inhale 2 puffs into the lungs every 6 (six) hours as needed for wheezing or shortness of breath. (Patient not taking: Reported on 04/14/2016) 1 Inhaler 0  . aspirin 81 MG tablet Take 1 tablet (81 mg total) by mouth daily. 30 tablet   . atorvastatin (LIPITOR) 40 MG tablet TAKE ONE TABLET BY MOUTH  DAILY 30 tablet 2  . benazepril-hydrochlorthiazide (LOTENSIN HCT) 20-12.5 MG tablet TAKE ONE TABLET BY MOUTH ONCE DAILY 30 tablet 5  . Blood Glucose Monitoring Suppl (FREESTYLE FREEDOM LITE) W/DEVICE KIT As directed , dx code 250.00 1 each 0  . Cholecalciferol (VITAMIN D3) 2000 units TABS Take 1 capsule by mouth daily.    . fluticasone (FLONASE) 50 MCG/ACT nasal spray Place 2 sprays into both nostrils daily. 16 g 0  . glucose blood (FREESTYLE LITE) test strip Test once daily. 100 each  3  . ketorolac (ACULAR) 0.5 % ophthalmic solution Place 1 drop into the left eye 4 (four) times daily.     . Lancets (FREESTYLE) lancets Test once daily. 100 each 5  . metFORMIN (GLUCOPHAGE) 1000 MG tablet TAKE ONE TABLET BY MOUTH TWICE DAILY WITH MEALS 60 tablet 5  . Multiple Vitamins-Calcium (ONE-A-DAY WOMENS FORMULA PO) Take 1 tablet by mouth daily.    Marland Kitchen omeprazole (PRILOSEC) 20 MG capsule Take 1 capsule (20 mg total) by mouth 2 (two) times daily before a meal. 20 capsule 4  . pantoprazole (PROTONIX) 40 MG tablet Take 1 tablet (40 mg total) by mouth daily. 30 tablet 2  . pioglitazone (ACTOS) 45 MG tablet TAKE ONE TABLET BY MOUTH ONCE DAILY 30 tablet 5  . Polyethyl Glycol-Propyl Glycol (SYSTANE OP) Apply to eye as needed. As directed for dry eyes    . prednisoLONE acetate (PRED FORTE) 1 % ophthalmic suspension Place 1 drop into the left eye 2  (two) times daily.     . Wheat Dextrin (BENEFIBER PO) Take 3 capsules by mouth as needed.      No current facility-administered medications on file prior to visit.     BP 132/68 (BP Location: Left Arm, Patient Position: Sitting, Cuff Size: Normal)   Pulse 64   Temp 97.9 F (36.6 C) (Oral)   Ht 4\' 11"  (1.499 m)   Wt 196 lb 12.8 oz (89.3 kg)   SpO2 99%   BMI 39.75 kg/m      Review of Systems  Constitutional: Negative.   HENT: Negative for congestion, dental problem, hearing loss, rhinorrhea, sinus pressure, sore throat and tinnitus.   Eyes: Negative for pain, discharge and visual disturbance.  Respiratory: Negative for cough and shortness of breath.   Cardiovascular: Negative for chest pain, palpitations and leg swelling.  Gastrointestinal: Negative for abdominal distention, abdominal pain, blood in stool, constipation, diarrhea, nausea and vomiting.  Genitourinary: Negative for difficulty urinating, dysuria, flank pain, frequency, hematuria, pelvic pain, urgency, vaginal bleeding, vaginal discharge and vaginal pain.  Musculoskeletal: Negative for arthralgias, gait problem and joint swelling.  Skin: Negative for rash.  Neurological: Negative for dizziness, syncope, speech difficulty, weakness, numbness and headaches.  Hematological: Negative for adenopathy.  Psychiatric/Behavioral: Negative for agitation, behavioral problems and dysphoric mood. The patient is not nervous/anxious.        Objective:   Physical Exam  Constitutional: She is oriented to person, place, and time. She appears well-developed and well-nourished.  HENT:  Head: Normocephalic.  Right Ear: External ear normal.  Left Ear: External ear normal.  Mouth/Throat: Oropharynx is clear and moist.  Eyes: Conjunctivae and EOM are normal. Pupils are equal, round, and reactive to light.  Neck: Normal range of motion. Neck supple. No thyromegaly present.  Cardiovascular: Normal rate, regular rhythm, normal heart sounds  and intact distal pulses.   Pulmonary/Chest: Effort normal and breath sounds normal.  Abdominal: Soft. Bowel sounds are normal. She exhibits no mass. There is no tenderness.  Musculoskeletal: Normal range of motion.  Lymphadenopathy:    She has no cervical adenopathy.  Neurological: She is alert and oriented to person, place, and time.  Skin: Skin is warm and dry. No rash noted.  Psychiatric: She has a normal mood and affect. Her behavior is normal.          Assessment & Plan:   Diabetes mellitus.  Hemoglobin A1c is in a nondiabetic range.  We'll decrease glimepiride to 2 mg daily Essential hypertension, stable Dyslipidemia.  Continue  statin therapy  Follow-up 6 months  Eilene Voigt Homero Fellers

## 2016-05-06 ENCOUNTER — Ambulatory Visit (INDEPENDENT_AMBULATORY_CARE_PROVIDER_SITE_OTHER): Payer: PPO | Admitting: Orthopedic Surgery

## 2016-05-12 DIAGNOSIS — M7501 Adhesive capsulitis of right shoulder: Secondary | ICD-10-CM | POA: Diagnosis not present

## 2016-05-19 DIAGNOSIS — M7501 Adhesive capsulitis of right shoulder: Secondary | ICD-10-CM | POA: Diagnosis not present

## 2016-05-26 DIAGNOSIS — M7501 Adhesive capsulitis of right shoulder: Secondary | ICD-10-CM | POA: Diagnosis not present

## 2016-06-02 ENCOUNTER — Encounter (INDEPENDENT_AMBULATORY_CARE_PROVIDER_SITE_OTHER): Payer: Self-pay | Admitting: Orthopedic Surgery

## 2016-06-02 ENCOUNTER — Ambulatory Visit (INDEPENDENT_AMBULATORY_CARE_PROVIDER_SITE_OTHER): Payer: PPO | Admitting: Orthopedic Surgery

## 2016-06-02 DIAGNOSIS — M7501 Adhesive capsulitis of right shoulder: Secondary | ICD-10-CM | POA: Diagnosis not present

## 2016-06-02 DIAGNOSIS — M25511 Pain in right shoulder: Secondary | ICD-10-CM | POA: Diagnosis not present

## 2016-06-02 DIAGNOSIS — G8929 Other chronic pain: Secondary | ICD-10-CM | POA: Diagnosis not present

## 2016-06-02 NOTE — Progress Notes (Signed)
Office Visit Note   Patient: Briana Patton           Date of Birth: [birth year]           MRN: [id number] Visit Date: 06/02/2016 Requested by: Gordy Savers, MD [address] [place], [place] [address] PCP: Rogelia Boga, MD  Subjective: Chief Complaint  Patient presents with  . Right Shoulder - Follow-up    HPI: Briana Patton is a 76 year old patient with right wrist pain here to follow-up EMG nerve study.  Nerve study showed mild right carpal tunnel syndrome.  Therapy for shoulder is helping the right wrist.              ROS: All systems reviewed are negative as they relate to the chief complaint within the history of present illness.  Patient denies  fevers or chills.   Assessment & Plan: Visit Diagnoses:  1. Chronic right shoulder pain     Plan: Impression is mild right carpal tunnel syndrome.  She is really ready yet for an injection.  Her symptoms are better with her shoulder therapy.  We will continue therapy for shoulder as long as she is improving.  No intervention yet for the right wrist.  She does have splints at home which she doesn't wear them.  Her symptoms become more severe that she would consider intervention I think the first one would be ultrasound-guided injection into the carpal canal.  If that fails then consideration towards surgery could be given.  I'll see her back as needed  Follow-Up Instructions: Return if symptoms worsen or fail to improve.   Orders:  No orders of the defined types were placed in this encounter.  No orders of the defined types were placed in this encounter.     Procedures: No procedures performed   Clinical Data: No additional findings.  Objective: Vital Signs: There were no vitals taken for this visit.  Physical Exam:   Constitutional: Patient appears well-developed HEENT:  Head: Normocephalic Eyes:EOM are normal Neck: Normal range of motion Cardiovascular: Normal rate Pulmonary/chest: Effort  normal Neurologic: Patient is alert Skin: Skin is warm Psychiatric: Patient has normal mood and affect    Ortho Exam: Examination the right wrist demonstrates pretty reasonable flexion extension and that she is off about 30 both sides compared to the left-hand side.  Pronation supination is full grip strength is symmetric mild arthritis is present in the DIP and PIP joints.  Negative carpal tunnel compression testing today negative Tinel's cubital tunnel at the elbow.  Specialty Comments:  No specialty comments available.  Imaging: No results found.   PMFS History: Patient Active Problem List   Diagnosis Date Noted  . Chronic right shoulder pain 04/14/2016  . Cervicalgia 04/14/2016  . GERD (gastroesophageal reflux disease) 06/03/2011  . Short-segment Barrett's esophagus 06/03/2011  . IBS (irritable bowel syndrome) 06/03/2011  . BLOOD CHEMISTRY, ABNORMAL 04/03/2010  . CHEST PAIN 04/02/2010  . VERTIGO, PERIPHERAL 01/17/2009  . GERD 06/25/2008  . Osteoarthritis 09/04/2007  . PALPITATIONS, OCCASIONAL 09/04/2007  . URI 03/24/2007  . Diabetes mellitus without complication (HCC) 07/26/2006  . Dyslipidemia 07/26/2006  . Essential hypertension 07/26/2006  . DIVERTICULOSIS, COLON 07/26/2006  . ARTHROSCOPY, LEFT KNEE, HX OF 07/26/2006   Past Medical History:  Diagnosis Date  . Abdominal pain    in past  . Barrett esophagus 12/12/2000   egd bx  . Diabetes mellitus   . Diverticulosis   . Diverticulosis of colon (without mention of hemorrhage)   . GERD (  gastroesophageal reflux disease)   . Hyperlipidemia   . Hypertension   . Internal hemorrhoids without mention of complication   . Osteoarthritis     Family History  Problem Relation Age of Onset  . Diabetes Brother   . Diabetes Mother   . Diabetes Father   . Stroke Father   . Alcohol abuse Sister     X40  . Epilepsy Brother   . Colon cancer Neg Hx   . Esophageal cancer Neg Hx   . Rectal cancer Neg Hx   . Stomach cancer  Neg Hx     Past Surgical History:  Procedure Laterality Date  . ABDOMINAL HYSTERECTOMY    . CESAREAN SECTION     3 times  . COLONOSCOPY    . KNEE ARTHROSCOPY     left knee surgery 1999  . LUMBAR LAMINECTOMY    . UPPER GASTROINTESTINAL ENDOSCOPY     Social History   Occupational History  . Not on file.   Social History Main Topics  . Smoking status: Former Smoker    Types: Cigarettes    Quit date: 03/08/1998  . Smokeless tobacco: Never Used  . Alcohol use No  . Drug use: No  . Sexual activity: Not on file

## 2016-06-09 DIAGNOSIS — M7501 Adhesive capsulitis of right shoulder: Secondary | ICD-10-CM | POA: Diagnosis not present

## 2016-06-16 DIAGNOSIS — M7501 Adhesive capsulitis of right shoulder: Secondary | ICD-10-CM | POA: Diagnosis not present

## 2016-06-23 DIAGNOSIS — M7501 Adhesive capsulitis of right shoulder: Secondary | ICD-10-CM | POA: Diagnosis not present

## 2016-06-30 DIAGNOSIS — M7501 Adhesive capsulitis of right shoulder: Secondary | ICD-10-CM | POA: Diagnosis not present

## 2016-07-07 DIAGNOSIS — M7501 Adhesive capsulitis of right shoulder: Secondary | ICD-10-CM | POA: Diagnosis not present

## 2016-07-14 DIAGNOSIS — M7501 Adhesive capsulitis of right shoulder: Secondary | ICD-10-CM | POA: Diagnosis not present

## 2016-07-21 DIAGNOSIS — M7501 Adhesive capsulitis of right shoulder: Secondary | ICD-10-CM | POA: Diagnosis not present

## 2016-07-27 ENCOUNTER — Telehealth: Payer: Self-pay | Admitting: Internal Medicine

## 2016-07-27 ENCOUNTER — Other Ambulatory Visit: Payer: Self-pay | Admitting: Pharmacy Technician

## 2016-07-27 ENCOUNTER — Other Ambulatory Visit: Payer: Self-pay | Admitting: Internal Medicine

## 2016-07-27 DIAGNOSIS — M25511 Pain in right shoulder: Principal | ICD-10-CM

## 2016-07-27 DIAGNOSIS — G8929 Other chronic pain: Secondary | ICD-10-CM

## 2016-07-27 DIAGNOSIS — M7501 Adhesive capsulitis of right shoulder: Secondary | ICD-10-CM

## 2016-07-27 MED ORDER — BENAZEPRIL-HYDROCHLOROTHIAZIDE 20-12.5 MG PO TABS: 1.0000 | ORAL_TABLET | Freq: Every day | ORAL | 2 refills | Status: DC

## 2016-07-27 MED ORDER — PANTOPRAZOLE SODIUM 40 MG PO TBEC: 40.0000 mg | DELAYED_RELEASE_TABLET | Freq: Every day | ORAL | 2 refills | Status: DC

## 2016-07-27 MED ORDER — PIOGLITAZONE HCL 45 MG PO TABS: 45.0000 mg | ORAL_TABLET | Freq: Every day | ORAL | 2 refills | Status: DC

## 2016-07-27 MED ORDER — METFORMIN HCL 1000 MG PO TABS: 1000.0000 mg | ORAL_TABLET | Freq: Two times a day (BID) | ORAL | 2 refills | Status: DC

## 2016-07-27 MED ORDER — ATORVASTATIN CALCIUM 40 MG PO TABS: 40.0000 mg | ORAL_TABLET | Freq: Every day | ORAL | 2 refills | Status: DC

## 2016-07-27 MED ORDER — GLIMEPIRIDE 4 MG PO TABS: 2.0000 mg | ORAL_TABLET | Freq: Every day | ORAL | 5 refills | Status: DC

## 2016-07-27 NOTE — Telephone Encounter (Signed)
Completed.

## 2016-07-27 NOTE — Patient Outreach (Signed)
[facility]) Care Management  07/27/2016  Briana Patton 1940/11/25 [id number]   Contacted patient in reference to mediation adherence for [employer]. Patient is compliant with diabetes and blood pressure medication's but showed low adherence to cholesterol medication. Patient thinks it was a month that she did not receive the medication and that's why adherence was affected. She is interested in 3 month supplies so at her request I contacted Dr. Burnice Logan to have her medication's sent to [place]. This will be a new pharmacy for the patient so I encouraged her to call and set up an account including insurance information so that the prescription's will be ready when she needs them.  Doreene Burke, [place] [phone]

## 2016-07-27 NOTE — Telephone Encounter (Signed)
Pt would like 90 day w/refills on all maintenance medications send to new pharm [facility] pharm

## 2016-07-28 DIAGNOSIS — M7501 Adhesive capsulitis of right shoulder: Secondary | ICD-10-CM | POA: Diagnosis not present

## 2016-07-29 MED FILL — BENAZEPRIL-HCTZ 20-12.5 MG: 20-12.5 | 90 days supply | Qty: 90 | Fill #0

## 2016-07-29 MED FILL — metFORMIN HCL 1000 MG TABS: 1000 | 90 days supply | Qty: 180 | Fill #0 | Status: TO

## 2016-07-29 MED FILL — ATORVASTATIN 40 MG TABLET: 40 | 90 days supply | Qty: 90 | Fill #0

## 2016-07-29 MED FILL — PIOGLITAZONE HCL 45 MG TAB: 45 | 90 days supply | Qty: 90 | Fill #0

## 2016-07-29 MED FILL — GLIMEPIRIDE 4 MG TABLET: 4 | 90 days supply | Qty: 45 | Fill #0 | Status: TO

## 2016-10-28 MED FILL — PIOGLITAZONE HCL 45 MG TAB: 45 | 90 days supply | Qty: 90 | Fill #1

## 2016-10-28 MED FILL — ATORVASTATIN 40 MG TABLET: 40 | 90 days supply | Qty: 90 | Fill #1

## 2016-10-28 MED FILL — BENAZEPRIL-HCTZ 20-12.5 MG: 20-12.5 | 90 days supply | Qty: 90 | Fill #1

## 2016-10-28 MED FILL — GLIMEPIRIDE 4 MG TABLET: 4 | 90 days supply | Qty: 45 | Fill #1 | Status: TO

## 2016-11-03 ENCOUNTER — Ambulatory Visit (INDEPENDENT_AMBULATORY_CARE_PROVIDER_SITE_OTHER): Payer: PPO | Admitting: Internal Medicine

## 2016-11-03 ENCOUNTER — Encounter: Payer: Self-pay | Admitting: Internal Medicine

## 2016-11-03 VITALS — BP 120/64 | HR 60 | Temp 98.8°F | Ht <= 58 in | Wt 197.0 lb

## 2016-11-03 DIAGNOSIS — M150 Primary generalized (osteo)arthritis: Secondary | ICD-10-CM

## 2016-11-03 DIAGNOSIS — I10 Essential (primary) hypertension: Secondary | ICD-10-CM | POA: Diagnosis not present

## 2016-11-03 DIAGNOSIS — K219 Gastro-esophageal reflux disease without esophagitis: Secondary | ICD-10-CM

## 2016-11-03 DIAGNOSIS — E119 Type 2 diabetes mellitus without complications: Secondary | ICD-10-CM

## 2016-11-03 DIAGNOSIS — Z0000 Encounter for general adult medical examination without abnormal findings: Secondary | ICD-10-CM | POA: Diagnosis not present

## 2016-11-03 DIAGNOSIS — E785 Hyperlipidemia, unspecified: Secondary | ICD-10-CM

## 2016-11-03 DIAGNOSIS — M159 Polyosteoarthritis, unspecified: Secondary | ICD-10-CM

## 2016-11-03 LAB — LIPID PANEL
CHOL/HDL RATIO: 3
Cholesterol: 142 mg/dL (ref 0–200)
HDL: 54.3 mg/dL (ref >39.00)
LDL Cholesterol: 64 mg/dL (ref 0–99)
NonHDL: 87.76
Triglycerides: 117 mg/dL (ref 0.0–149.0)
VLDL: 23.4 mg/dL (ref 0.0–40.0)

## 2016-11-03 LAB — CBC WITH DIFFERENTIAL/PLATELET
BASOS ABS: 0.1 10*3/uL (ref 0.0–0.1)
Basophils Relative: 1 % (ref 0.0–3.0)
EOS ABS: 0.2 10*3/uL (ref 0.0–0.7)
Eosinophils Relative: 3.3 % (ref 0.0–5.0)
HEMATOCRIT: 32.5 % — AB (ref 36.0–46.0)
HEMOGLOBIN: 10.7 g/dL — AB (ref 12.0–15.0)
LYMPHS PCT: 32.8 % (ref 12.0–46.0)
Lymphs Abs: 1.8 10*3/uL (ref 0.7–4.0)
MCHC: 32.8 g/dL (ref 30.0–36.0)
MCV: 94 fl (ref 78.0–100.0)
MONOS PCT: 7.3 % (ref 3.0–12.0)
Monocytes Absolute: 0.4 10*3/uL (ref 0.1–1.0)
NEUTROS ABS: 3 10*3/uL (ref 1.4–7.7)
Neutrophils Relative %: 55.6 % (ref 43.0–77.0)
PLATELETS: 183 10*3/uL (ref 150.0–400.0)
RBC: 3.46 Mil/uL — AB (ref 3.87–5.11)
RDW: 15.5 % (ref 11.5–15.5)
WBC: 5.4 10*3/uL (ref 4.0–10.5)

## 2016-11-03 LAB — COMPREHENSIVE METABOLIC PANEL
ALK PHOS: 42 U/L (ref 39–117)
ALT: 12 U/L (ref 0–35)
AST: 15 U/L (ref 0–37)
Albumin: 4.3 g/dL (ref 3.5–5.2)
BILIRUBIN TOTAL: 0.6 mg/dL (ref 0.2–1.2)
BUN: 32 mg/dL — AB (ref 6–23)
CO2: 32 meq/L (ref 19–32)
Calcium: 9.9 mg/dL (ref 8.4–10.5)
Chloride: 102 mEq/L (ref 96–112)
Creatinine, Ser: 1.03 mg/dL (ref 0.40–1.20)
GFR: 55.53 mL/min — ABNORMAL LOW (ref >60.00)
GLUCOSE: 95 mg/dL (ref 70–99)
POTASSIUM: 4.9 meq/L (ref 3.5–5.1)
SODIUM: 138 meq/L (ref 135–145)
TOTAL PROTEIN: 7.2 g/dL (ref 6.0–8.3)

## 2016-11-03 LAB — MICROALBUMIN / CREATININE URINE RATIO
CREATININE, U: 83.9 mg/dL
MICROALB/CREAT RATIO: 0.8 mg/g (ref 0.0–30.0)
Microalb, Ur: <0.7 mg/dL (ref 0.0–1.9)

## 2016-11-03 LAB — HEMOGLOBIN A1C: HEMOGLOBIN A1C: 6.1 % (ref 4.6–6.5)

## 2016-11-03 LAB — TSH: TSH: 4.97 u[IU]/mL — AB (ref 0.35–4.50)

## 2016-11-03 NOTE — Progress Notes (Signed)
Subjective:    Patient ID: Briana Patton, female    DOB: 07/23/1940, 76 y.o.   MRN: [id number]  HPI  Lab Results  Component Value Date   HGBA1C 6.2 05/05/2016    76 year old patient who is seen today for a preventive health examination and subsequent Medicare wellness visit She has type 2 diabetes which has been well controlled on oral medications.  She has exogenous obesity. She is followed by GI with a history of Barrett's esophagus and also history of treated H. Pylori gastritis. She has essential hypertension and history of dyslipidemia and osteoarthritis.  Past Medical History:  Diagnosis Date  . Abdominal pain    in past  . Barrett esophagus 12/12/2000   egd bx  . Diabetes mellitus   . Diverticulosis   . Diverticulosis of colon (without mention of hemorrhage)   . GERD (gastroesophageal reflux disease)   . Hyperlipidemia   . Hypertension   . Internal hemorrhoids without mention of complication   . Osteoarthritis      Social History   Social History  . Marital status: Married    Spouse name: N/A  . Number of children: N/A  . Years of education: N/A   Occupational History  . Not on file.   Social History Main Topics  . Smoking status: Former Smoker    Types: Cigarettes    Quit date: 03/08/1998  . Smokeless tobacco: Never Used  . Alcohol use No  . Drug use: No  . Sexual activity: Not on file   Other Topics Concern  . Not on file   Social History Narrative   Father age 73 complications of cardiac and cerebrovascular disease          Past Surgical History:  Procedure Laterality Date  . ABDOMINAL HYSTERECTOMY    . CESAREAN SECTION     3 times  . COLONOSCOPY    . KNEE ARTHROSCOPY     left knee surgery 1999  . LUMBAR LAMINECTOMY    . UPPER GASTROINTESTINAL ENDOSCOPY      Family History  Problem Relation Age of Onset  . Diabetes Brother   . Diabetes Mother   . Diabetes Father   . Stroke Father   . Alcohol abuse Sister        X45  . Epilepsy  Brother   . Colon cancer Neg Hx   . Esophageal cancer Neg Hx   . Rectal cancer Neg Hx   . Stomach cancer Neg Hx     Allergies  Allergen Reactions  . Penicillins     WHELTS    Current Outpatient Prescriptions on File Prior to Visit  Medication Sig Dispense Refill  . albuterol (PROVENTIL HFA;VENTOLIN HFA) 108 (90 BASE) MCG/ACT inhaler Inhale 2 puffs into the lungs every 6 (six) hours as needed for wheezing or shortness of breath. 1 Inhaler 0  . aspirin 81 MG tablet Take 1 tablet (81 mg total) by mouth daily. 30 tablet   . atorvastatin (LIPITOR) 40 MG tablet Take 1 tablet (40 mg total) by mouth daily. 90 tablet 2  . benazepril-hydrochlorthiazide (LOTENSIN HCT) 20-12.5 MG tablet Take 1 tablet by mouth daily. 90 tablet 2  . Blood Glucose Monitoring Suppl (FREESTYLE FREEDOM LITE) W/DEVICE KIT As directed , dx code 250.00 1 each 0  . Cholecalciferol (VITAMIN D3) 2000 units TABS Take 1 capsule by mouth daily.    . fluticasone (FLONASE) 50 MCG/ACT nasal spray Place 2 sprays into both nostrils daily. 16 g 0  .  glimepiride (AMARYL) 4 MG tablet Take 0.5 tablets (2 mg total) by mouth daily with breakfast. 90 tablet 5  . glucose blood (FREESTYLE LITE) test strip Test once daily. 100 each 3  . ketorolac (ACULAR) 0.5 % ophthalmic solution Place 1 drop into the left eye 4 (four) times daily.     . Lancets (FREESTYLE) lancets Test once daily. 100 each 5  . metFORMIN (GLUCOPHAGE) 1000 MG tablet Take 1 tablet (1,000 mg total) by mouth 2 (two) times daily with a meal. 180 tablet 2  . Multiple Vitamins-Calcium (ONE-A-DAY WOMENS FORMULA PO) Take 1 tablet by mouth daily.    . pantoprazole (PROTONIX) 40 MG tablet Take 1 tablet (40 mg total) by mouth daily. 30 tablet 2  . pioglitazone (ACTOS) 45 MG tablet Take 1 tablet (45 mg total) by mouth daily. 90 tablet 2  . Polyethyl Glycol-Propyl Glycol (SYSTANE OP) Apply to eye as needed. As directed for dry eyes    . prednisoLONE acetate (PRED FORTE) 1 % ophthalmic  suspension Place 1 drop into the left eye 2 (two) times daily.     . Wheat Dextrin (BENEFIBER PO) Take 3 capsules by mouth as needed.      No current facility-administered medications on file prior to visit.     BP 120/64 (BP Location: Left Arm, Patient Position: Sitting, Cuff Size: Normal)   Pulse 60   Temp 98.8 F (37.1 C) (Oral)   Ht 4\' 9"  (1.448 m)   Wt 197 lb (89.4 kg)   SpO2 98%   BMI 42.63 kg/m   Medicare wellness visit  1. Risk factors, based on past  M,S,F history.  Cardiovascular risk factors include a history of diabetes, hypertension and dyslipidemia  2.  Physical activities:no regular exercise program.  She does return from a trip to the [facility] and did a lot of traveling.  She did have a difficult time negotiating her way through the airports  3.  Depression/mood:no history of major depression or mood disorder  4.  Hearing:no deficit  5.  ADL's:independent  6.  Fall risk:low  7.  Home safety:no problems identified  8.  Height weight, and visual acuity;height and weight stable no change in visual acuity.  Patient has had cataract extraction surgery.  9.  Counseling:after the weight loss strongly encouraged  10. Lab orders based on risk factors:laboratory update will be reviewed.  This will include lipid profile, hemoglobin A1c and urine for microalbumin  11. Referral :follow-up ophthalmology encouraged  12. Care plan:continue efforts at aggressive risk factor modification  13. Cognitive assessment: alert and oriented with normal affect.  No cognitive dysfunction  14. Screening: Patient provided with a written and personalized 5-10 year screening schedule in the AVS.    15. Provider List Update: ophthalmology primary care and GI    Review of Systems  Constitutional: Negative.   HENT: Negative for congestion, dental problem, hearing loss, rhinorrhea, sinus pressure, sore throat and tinnitus.   Eyes: Negative for pain, discharge and visual  disturbance.  Respiratory: Negative for cough and shortness of breath.   Cardiovascular: Negative for chest pain, palpitations and leg swelling.  Gastrointestinal: Negative for abdominal distention, abdominal pain, blood in stool, constipation, diarrhea, nausea and vomiting.  Genitourinary: Negative for difficulty urinating, dysuria, flank pain, frequency, hematuria, pelvic pain, urgency, vaginal bleeding, vaginal discharge and vaginal pain.  Musculoskeletal: Positive for arthralgias and back pain. Negative for gait problem and joint swelling.  Skin: Negative for rash.  Neurological: Negative for dizziness, syncope, speech  difficulty, weakness, numbness and headaches.  Hematological: Negative for adenopathy.  Psychiatric/Behavioral: Negative for agitation, behavioral problems and dysphoric mood. The patient is not nervous/anxious.        Objective:   Physical Exam  Constitutional: She is oriented to person, place, and time. She appears well-developed and well-nourished.  HENT:  Head: Normocephalic.  Right Ear: External ear normal.  Left Ear: External ear normal.  Mouth/Throat: Oropharynx is clear and moist.  Eyes: Pupils are equal, round, and reactive to light. Conjunctivae and EOM are normal.  Neck: Normal range of motion. Neck supple. No thyromegaly present.  Cardiovascular: Normal rate, regular rhythm and normal heart sounds.   Pedal pulses diminished on the right  Pulmonary/Chest: Effort normal and breath sounds normal.  Abdominal: Soft. Bowel sounds are normal. She exhibits no mass. There is no tenderness.  Musculoskeletal: Normal range of motion.  Lymphadenopathy:    She has no cervical adenopathy.  Neurological: She is alert and oriented to person, place, and time.  Skin: Skin is warm and dry. No rash noted.  Psychiatric: She has a normal mood and affect. Her behavior is normal.          Assessment & Plan:   Preventive health care Subsequent Medicare wellness  visit Diabetes mellitus.  Will review hemoglobin A1c, urine for microalbumin and lipid profile Dyslipidemia.  Continue statin therapy Essential hypertension, well-controlled History of Barrett's esophagus.  We'll continue surveillance with GI History of H. Pylori gastritis Obesity.  Weight loss encouraged Osteoarthritis  Review lab Weight loss encouraged Follow-up 6 months or sooner depending on hemoglobin A1c  Rogelia Boga

## 2016-11-03 NOTE — Patient Instructions (Addendum)
Limit your sodium (Salt) intake   Please check your hemoglobin A1c every 3-6  Months    It is important that you exercise regularly, at least 20 minutes 3 to 4 times per week.  If you develop chest pain or shortness of breath seek  medical attention.  You need to lose weight.  Consider a lower calorie diet and regular exercise.  Please check your blood pressure on a regular basis.  If it is consistently greater than 150/90, please make an office appointment.   Present.  AVMs is that you have already present in the AVS opportunity

## 2017-02-04 ENCOUNTER — Other Ambulatory Visit: Payer: Self-pay | Admitting: Internal Medicine

## 2017-02-04 DIAGNOSIS — M7501 Adhesive capsulitis of right shoulder: Secondary | ICD-10-CM

## 2017-02-04 DIAGNOSIS — M25511 Pain in right shoulder: Principal | ICD-10-CM

## 2017-02-04 DIAGNOSIS — G8929 Other chronic pain: Secondary | ICD-10-CM

## 2017-02-04 MED FILL — GLIMEPIRIDE 4 MG TABLET: 4 | 90 days supply | Qty: 45 | Fill #2 | Status: TO

## 2017-02-04 MED FILL — ATORVASTATIN 40 MG TABLET: 40 | 90 days supply | Qty: 90 | Fill #0 | Status: TO

## 2017-02-04 MED FILL — BENAZEPRIL-HCTZ 20-12.5 MG: 20-12.5 | 90 days supply | Qty: 90 | Fill #0 | Status: TO

## 2017-02-04 MED FILL — metFORMIN HCL 1000 MG TABS: 1000 | 90 days supply | Qty: 180 | Fill #1 | Status: TO

## 2017-02-04 MED FILL — PIOGLITAZONE HCL 45 MG TAB: 45 | 90 days supply | Qty: 90 | Fill #0 | Status: TO

## 2017-05-04 ENCOUNTER — Encounter: Payer: Self-pay | Admitting: Internal Medicine

## 2017-05-04 ENCOUNTER — Ambulatory Visit (INDEPENDENT_AMBULATORY_CARE_PROVIDER_SITE_OTHER): Payer: PPO | Admitting: Internal Medicine

## 2017-05-04 VITALS — BP 118/50 | HR 75 | Temp 98.0°F | Wt 195.0 lb

## 2017-05-04 DIAGNOSIS — E119 Type 2 diabetes mellitus without complications: Secondary | ICD-10-CM

## 2017-05-04 DIAGNOSIS — M150 Primary generalized (osteo)arthritis: Secondary | ICD-10-CM

## 2017-05-04 DIAGNOSIS — E1169 Type 2 diabetes mellitus with other specified complication: Secondary | ICD-10-CM

## 2017-05-04 DIAGNOSIS — I10 Essential (primary) hypertension: Secondary | ICD-10-CM

## 2017-05-04 DIAGNOSIS — M159 Polyosteoarthritis, unspecified: Secondary | ICD-10-CM

## 2017-05-04 DIAGNOSIS — D638 Anemia in other chronic diseases classified elsewhere: Secondary | ICD-10-CM | POA: Diagnosis not present

## 2017-05-04 LAB — CBC WITH DIFFERENTIAL/PLATELET
Basophils Absolute: 0.1 10*3/uL (ref 0.0–0.1)
Basophils Relative: 1.2 % (ref 0.0–3.0)
EOS ABS: 0.1 10*3/uL (ref 0.0–0.7)
Eosinophils Relative: 3.1 % (ref 0.0–5.0)
HEMATOCRIT: 31.2 % — AB (ref 36.0–46.0)
HEMOGLOBIN: 10.3 g/dL — AB (ref 12.0–15.0)
LYMPHS PCT: 33.2 % (ref 12.0–46.0)
Lymphs Abs: 1.6 10*3/uL (ref 0.7–4.0)
MCHC: 33.2 g/dL (ref 30.0–36.0)
MCV: 92.7 fl (ref 78.0–100.0)
Monocytes Absolute: 0.3 10*3/uL (ref 0.1–1.0)
Monocytes Relative: 7.1 % (ref 3.0–12.0)
Neutro Abs: 2.6 10*3/uL (ref 1.4–7.7)
Neutrophils Relative %: 55.4 % (ref 43.0–77.0)
Platelets: 157 10*3/uL (ref 150.0–400.0)
RBC: 3.36 Mil/uL — ABNORMAL LOW (ref 3.87–5.11)
RDW: 15.2 % (ref 11.5–15.5)
WBC: 4.7 10*3/uL (ref 4.0–10.5)

## 2017-05-04 LAB — FERRITIN: Ferritin: 26.6 ng/mL (ref 10.0–291.0)

## 2017-05-04 LAB — POCT GLYCOSYLATED HEMOGLOBIN (HGB A1C): HEMOGLOBIN A1C: 5.7

## 2017-05-04 NOTE — Addendum Note (Signed)
Addended by: Gwenyth Ober R on: 05/04/2017 11:58 AM   Modules accepted: Orders

## 2017-05-04 NOTE — Progress Notes (Signed)
Subjective:    Patient ID: Briana Patton, female    DOB: [birth year], 77 y.o.   MRN: [id number]  HPI 77 year old patient who is seen today for follow-up.  She has type 2 diabetes which has been controlled on triple oral medication.  Hemoglobin A1c today 5.7 She has essential hypertension.  She has a history of osteoarthritis and does complain of some low back pain. No symptomatic hypoglycemia.  Her blood pressure has been well controlled.  She remains on statin therapy.  No cardiopulmonary complaints.  Her husband is scheduled for heart catheterization later this week due to unstable angina  Patient has a history of stable anemia.  Last colonoscopy 5 years ago  Past Medical History:  Diagnosis Date  . Abdominal pain    in past  . Barrett esophagus 12/12/2000   egd bx  . Diabetes mellitus   . Diverticulosis   . Diverticulosis of colon (without mention of hemorrhage)   . GERD (gastroesophageal reflux disease)   . Hyperlipidemia   . Hypertension   . Internal hemorrhoids without mention of complication   . Osteoarthritis      Social History   Socioeconomic History  . Marital status: Married    Spouse name: Not on file  . Number of children: Not on file  . Years of education: Not on file  . Highest education level: Not on file  Social Needs  . Financial resource strain: Not on file  . Food insecurity - worry: Not on file  . Food insecurity - inability: Not on file  . Transportation needs - medical: Not on file  . Transportation needs - non-medical: Not on file  Occupational History  . Not on file  Tobacco Use  . Smoking status: Former Smoker    Types: Cigarettes    Last attempt to quit: 03/08/1998    Years since quitting: 19.1  . Smokeless tobacco: Never Used  Substance and Sexual Activity  . Alcohol use: No    Alcohol/week: 0.0 oz  . Drug use: No  . Sexual activity: Not on file  Other Topics Concern  . Not on file  Social History Narrative   Father age 86  complications of cardiac and cerebrovascular disease          Past Surgical History:  Procedure Laterality Date  . ABDOMINAL HYSTERECTOMY    . CESAREAN SECTION     3 times  . COLONOSCOPY    . KNEE ARTHROSCOPY     left knee surgery 1999  . LUMBAR LAMINECTOMY    . UPPER GASTROINTESTINAL ENDOSCOPY      Family History  Problem Relation Age of Onset  . Diabetes Brother   . Diabetes Mother   . Diabetes Father   . Stroke Father   . Alcohol abuse Sister        X7  . Epilepsy Brother   . Colon cancer Neg Hx   . Esophageal cancer Neg Hx   . Rectal cancer Neg Hx   . Stomach cancer Neg Hx     Allergies  Allergen Reactions  . Penicillins     WHELTS    Current Outpatient Medications on File Prior to Visit  Medication Sig Dispense Refill  . albuterol (PROVENTIL HFA;VENTOLIN HFA) 108 (90 BASE) MCG/ACT inhaler Inhale 2 puffs into the lungs every 6 (six) hours as needed for wheezing or shortness of breath. 1 Inhaler 0  . aspirin 81 MG tablet Take 1 tablet (81 mg total) by mouth daily. 30 tablet   .  atorvastatin (LIPITOR) 40 MG tablet TAKE 1 TABLET BY MOUTH DAILY. 90 tablet 1  . benazepril-hydrochlorthiazide (LOTENSIN HCT) 20-12.5 MG tablet TAKE 1 TABLET BY MOUTH DAILY. 90 tablet 1  . Blood Glucose Monitoring Suppl (FREESTYLE FREEDOM LITE) W/DEVICE KIT As directed , dx code 250.00 1 each 0  . Cholecalciferol (VITAMIN D3) 2000 units TABS Take 1 capsule by mouth daily.    . fluticasone (FLONASE) 50 MCG/ACT nasal spray Place 2 sprays into both nostrils daily. 16 g 0  . glimepiride (AMARYL) 4 MG tablet Take 0.5 tablets (2 mg total) by mouth daily with breakfast. 90 tablet 5  . glucose blood (FREESTYLE LITE) test strip Test once daily. 100 each 3  . ketorolac (ACULAR) 0.5 % ophthalmic solution Place 1 drop into the left eye 4 (four) times daily.     . Lancets (FREESTYLE) lancets Test once daily. 100 each 5  . metFORMIN (GLUCOPHAGE) 1000 MG tablet Take 1 tablet (1,000 mg total) by mouth 2  (two) times daily with a meal. 180 tablet 2  . Multiple Vitamins-Calcium (ONE-A-DAY WOMENS FORMULA PO) Take 1 tablet by mouth daily.    . pantoprazole (PROTONIX) 40 MG tablet Take 1 tablet (40 mg total) by mouth daily. 30 tablet 2  . pioglitazone (ACTOS) 45 MG tablet TAKE 1 TABLET BY MOUTH DAILY. 90 tablet 1  . Polyethyl Glycol-Propyl Glycol (SYSTANE OP) Apply to eye as needed. As directed for dry eyes    . prednisoLONE acetate (PRED FORTE) 1 % ophthalmic suspension Place 1 drop into the left eye 2 (two) times daily.     . Wheat Dextrin (BENEFIBER PO) Take 3 capsules by mouth as needed.      No current facility-administered medications on file prior to visit.     BP (!) 118/50 (BP Location: Left Arm, Patient Position: Sitting, Cuff Size: Large)   Pulse 75   Temp 98 F (36.7 C) (Oral)   Wt 195 lb (88.5 kg)   SpO2 99%   BMI 42.20 kg/m      Review of Systems  Constitutional: Negative.   HENT: Negative for congestion, dental problem, hearing loss, rhinorrhea, sinus pressure, sore throat and tinnitus.   Eyes: Negative for pain, discharge and visual disturbance.  Respiratory: Negative for cough and shortness of breath.   Cardiovascular: Negative for chest pain, palpitations and leg swelling.  Gastrointestinal: Negative for abdominal distention, abdominal pain, blood in stool, constipation, diarrhea, nausea and vomiting.  Genitourinary: Negative for difficulty urinating, dysuria, flank pain, frequency, hematuria, pelvic pain, urgency, vaginal bleeding, vaginal discharge and vaginal pain.  Musculoskeletal: Positive for arthralgias and back pain. Negative for gait problem and joint swelling.  Skin: Negative for rash.  Neurological: Negative for dizziness, syncope, speech difficulty, weakness, numbness and headaches.  Hematological: Negative for adenopathy.  Psychiatric/Behavioral: Negative for agitation, behavioral problems and dysphoric mood. The patient is not nervous/anxious.          Objective:   Physical Exam  Constitutional: She is oriented to person, place, and time. She appears well-developed and well-nourished.  HENT:  Head: Normocephalic.  Right Ear: External ear normal.  Left Ear: External ear normal.  Mouth/Throat: Oropharynx is clear and moist.  Eyes: Conjunctivae and EOM are normal. Pupils are equal, round, and reactive to light.  Neck: Normal range of motion. Neck supple. No thyromegaly present.  Cardiovascular: Normal rate, regular rhythm and normal heart sounds.  Pulmonary/Chest: Effort normal and breath sounds normal.  Abdominal: Soft. Bowel sounds are normal. She exhibits no mass.  There is no tenderness.  Musculoskeletal: Normal range of motion.  Lymphadenopathy:    She has no cervical adenopathy.  Neurological: She is alert and oriented to person, place, and time.  Skin: Skin is warm and dry. No rash noted.  Psychiatric: She has a normal mood and affect. Her behavior is normal.          Assessment & Plan:   Diabetes mellitus.  Hemoglobin A1c 5.7.  Will discontinue glimepiride Essential hypertension stable Osteoarthritis  We will recheck hemoglobin A1c in 3 months No other change in medical regimen We will check CBC and ferritin level due to history of anemia  Rogelia Boga

## 2017-05-04 NOTE — Patient Instructions (Signed)
Discontinue glimepiride  Limit your sodium (Salt) intake   Please check your hemoglobin A1c every 3 months

## 2017-05-06 ENCOUNTER — Telehealth: Payer: Self-pay | Admitting: Internal Medicine

## 2017-05-06 NOTE — Telephone Encounter (Signed)
Copied from [place] [id number]. Topic: Quick Communication - See Telephone Encounter >> May 06, 2017  4:37 PM Clack, Laban Emperor wrote: CRM for notification. See Telephone encounter for:  Pt call for lab results.  Please f/u  05/06/17.

## 2017-05-11 NOTE — Telephone Encounter (Signed)
Spoke with patient to inform her of her labs. I was told per Dr.Todd to inform the patient that her labs are stable and to route conversation to [facility] for further information. Patient requested her labs to be mailed. No further action needed.

## 2017-05-11 NOTE — Telephone Encounter (Signed)
Spoke with patient to inform her of her labs. I was told per Dr.Todd to inform the patient that her labs are stable and to route conversation to [facility] for further information. Patient requested her labs to be mailed. No further action needed.

## 2017-06-21 ENCOUNTER — Other Ambulatory Visit: Payer: Self-pay | Admitting: Internal Medicine

## 2017-06-21 DIAGNOSIS — M25511 Pain in right shoulder: Principal | ICD-10-CM

## 2017-06-21 DIAGNOSIS — G8929 Other chronic pain: Secondary | ICD-10-CM

## 2017-06-21 DIAGNOSIS — M7501 Adhesive capsulitis of right shoulder: Secondary | ICD-10-CM

## 2017-06-27 ENCOUNTER — Other Ambulatory Visit: Payer: Self-pay | Admitting: Internal Medicine

## 2017-06-27 DIAGNOSIS — M25511 Pain in right shoulder: Principal | ICD-10-CM

## 2017-06-27 DIAGNOSIS — G8929 Other chronic pain: Secondary | ICD-10-CM

## 2017-06-27 DIAGNOSIS — M7501 Adhesive capsulitis of right shoulder: Secondary | ICD-10-CM

## 2017-08-03 ENCOUNTER — Ambulatory Visit (INDEPENDENT_AMBULATORY_CARE_PROVIDER_SITE_OTHER): Payer: PPO | Admitting: Internal Medicine

## 2017-08-03 ENCOUNTER — Encounter: Payer: Self-pay | Admitting: Internal Medicine

## 2017-08-03 VITALS — BP 122/50 | HR 60 | Temp 97.6°F | Wt 187.0 lb

## 2017-08-03 DIAGNOSIS — E785 Hyperlipidemia, unspecified: Secondary | ICD-10-CM

## 2017-08-03 DIAGNOSIS — M150 Primary generalized (osteo)arthritis: Secondary | ICD-10-CM

## 2017-08-03 DIAGNOSIS — M159 Polyosteoarthritis, unspecified: Secondary | ICD-10-CM

## 2017-08-03 DIAGNOSIS — E119 Type 2 diabetes mellitus without complications: Secondary | ICD-10-CM

## 2017-08-03 DIAGNOSIS — I10 Essential (primary) hypertension: Secondary | ICD-10-CM

## 2017-08-03 LAB — POCT GLYCOSYLATED HEMOGLOBIN (HGB A1C): Hemoglobin A1C: 6.2 % — AB (ref 4.0–5.6)

## 2017-08-03 NOTE — Patient Instructions (Addendum)
Limit your sodium (Salt) intake   Please check your hemoglobin A1c every 3-6 months    Varicose Veins Varicose veins are veins that have become enlarged and twisted. They are usually seen in the legs but can occur in other parts of the body as well. What are the causes? This condition is the result of valves in the veins not working properly. Valves in the veins help to return blood from the leg to the heart. If these valves are damaged, blood flows backward and backs up into the veins in the leg near the skin. This causes the veins to become larger. What increases the risk? People who are on their feet a lot, who are pregnant, or who are overweight are more likely to develop varicose veins. What are the signs or symptoms?  Bulging, twisted-appearing, bluish veins, most commonly found on the legs.  Leg pain or a feeling of heaviness. These symptoms may be worse at the end of the day.  Leg swelling.  Changes in skin color. How is this diagnosed? A health care provider can usually diagnose varicose veins by examining your legs. Your health care provider may also recommend an ultrasound of your leg veins. How is this treated? Most varicose veins can be treated at home.However, other treatments are available for people who have persistent symptoms or want to improve the cosmetic appearance of the varicose veins. These treatment options include:  Sclerotherapy. A solution is injected into the vein to close it off.  Laser treatment. A laser is used to heat the vein to close it off.  Radiofrequency vein ablation. An electrical current produced by radio waves is used to close off the vein.  Phlebectomy. The vein is surgically removed through small incisions made over the varicose vein.  Vein ligation and stripping. The vein is surgically removed through incisions made over the varicose vein after the vein has been tied (ligated).  Follow these instructions at home:  Do not stand or sit  in one position for long periods of time. Do not sit with your legs crossed. Rest with your legs raised during the day.  Wear compression stockings as directed by your health care provider. These stockings help to prevent blood clots and reduce swelling in your legs.  Do not wear other tight, encircling garments around your legs, pelvis, or waist.  Walk as much as possible to increase blood flow.  Raise the foot of your bed at night with 2-inch blocks.  If you get a cut in the skin over the vein and the vein bleeds, lie down with your leg raised and press on it with a clean cloth until the bleeding stops. Then place a bandage (dressing) on the cut. See your health care provider if it continues to bleed. Contact a health care provider if:  The skin around your ankle starts to break down.  You have pain, redness, tenderness, or hard swelling in your leg over a vein.  You are uncomfortable because of leg pain. This information is not intended to replace advice given to you by your health care provider. Make sure you discuss any questions you have with your health care provider. Document Released: 12/02/2004 Document Revised: 07/31/2015 Document Reviewed: 08/26/2015 Elsevier Interactive Patient Education  2017 [employer].

## 2017-08-03 NOTE — Progress Notes (Signed)
Subjective:    Patient ID: Briana Patton, female    DOB: 10-24-1940, 77 y.o.   MRN: [id number]  HPI  Lab Results  Component Value Date   HGBA1C 5.7 05/04/2017    BP Readings from Last 3 Encounters:  08/03/17 (!) 122/50  05/04/17 (!) 118/50  11/03/16 11/80   77 year old patient who is seen today for follow-up of type 2 diabetes.  3 months ago hemoglobin A1c was 5.7.  No history of hypoglycemia.  SU therapy was discontinued at that time and she remains on pioglitazone and metformin.  Repeat hemoglobin A1c today 6.2  Past Medical History:  Diagnosis Date  . Abdominal pain    in past  . Barrett esophagus 12/12/2000   egd bx  . Diabetes mellitus   . Diverticulosis   . Diverticulosis of colon (without mention of hemorrhage)   . GERD (gastroesophageal reflux disease)   . Hyperlipidemia   . Hypertension   . Internal hemorrhoids without mention of complication   . Osteoarthritis      Social History   Socioeconomic History  . Marital status: Married    Spouse name: Not on file  . Number of children: Not on file  . Years of education: Not on file  . Highest education level: Not on file  Occupational History  . Not on file  Social Needs  . Financial resource strain: Not on file  . Food insecurity:    Worry: Not on file    Inability: Not on file  . Transportation needs:    Medical: Not on file    Non-medical: Not on file  Tobacco Use  . Smoking status: Former Smoker    Types: Cigarettes    Last attempt to quit: 03/08/1998    Years since quitting: 19.4  . Smokeless tobacco: Never Used  Substance and Sexual Activity  . Alcohol use: No    Alcohol/week: 0.0 oz  . Drug use: No  . Sexual activity: Not on file  Lifestyle  . Physical activity:    Days per week: Not on file    Minutes per session: Not on file  . Stress: Not on file  Relationships  . Social connections:    Talks on phone: Not on file    Gets together: Not on file    Attends religious service: Not  on file    Active member of club or organization: Not on file    Attends meetings of clubs or organizations: Not on file    Relationship status: Not on file  . Intimate partner violence:    Fear of current or ex partner: Not on file    Emotionally abused: Not on file    Physically abused: Not on file    Forced sexual activity: Not on file  Other Topics Concern  . Not on file  Social History Narrative   Father age 70 complications of cardiac and cerebrovascular disease          Past Surgical History:  Procedure Laterality Date  . ABDOMINAL HYSTERECTOMY    . CESAREAN SECTION     3 times  . COLONOSCOPY    . KNEE ARTHROSCOPY     left knee surgery 1999  . LUMBAR LAMINECTOMY    . UPPER GASTROINTESTINAL ENDOSCOPY      Family History  Problem Relation Age of Onset  . Diabetes Brother   . Diabetes Mother   . Diabetes Father   . Stroke Father   . Alcohol abuse Sister  X2  . Epilepsy Brother   . Colon cancer Neg Hx   . Esophageal cancer Neg Hx   . Rectal cancer Neg Hx   . Stomach cancer Neg Hx     Allergies  Allergen Reactions  . Penicillins     WHELTS    Current Outpatient Medications on File Prior to Visit  Medication Sig Dispense Refill  . albuterol (PROVENTIL HFA;VENTOLIN HFA) 108 (90 BASE) MCG/ACT inhaler Inhale 2 puffs into the lungs every 6 (six) hours as needed for wheezing or shortness of breath. 1 Inhaler 0  . aspirin 81 MG tablet Take 1 tablet (81 mg total) by mouth daily. 30 tablet   . atorvastatin (LIPITOR) 40 MG tablet TAKE 1 TABLET BY MOUTH ONCE DAILY 90 tablet 0  . benazepril-hydrochlorthiazide (LOTENSIN HCT) 20-12.5 MG tablet TAKE 1 TABLET BY MOUTH ONCE DAILY 90 tablet 0  . Blood Glucose Monitoring Suppl (FREESTYLE FREEDOM LITE) W/DEVICE KIT As directed , dx code 250.00 1 each 0  . Cholecalciferol (VITAMIN D3) 2000 units TABS Take 1 capsule by mouth daily.    . fluticasone (FLONASE) 50 MCG/ACT nasal spray Place 2 sprays into both nostrils daily.  16 g 0  . glucose blood (FREESTYLE LITE) test strip Test once daily. 100 each 3  . ketorolac (ACULAR) 0.5 % ophthalmic solution Place 1 drop into the left eye 4 (four) times daily.     . Lancets (FREESTYLE) lancets Test once daily. 100 each 5  . metFORMIN (GLUCOPHAGE) 1000 MG tablet Take 1 tablet (1,000 mg total) by mouth 2 (two) times daily with a meal. 180 tablet 2  . Multiple Vitamins-Calcium (ONE-A-DAY WOMENS FORMULA PO) Take 1 tablet by mouth daily.    . pantoprazole (PROTONIX) 40 MG tablet Take 1 tablet (40 mg total) by mouth daily. 30 tablet 2  . pioglitazone (ACTOS) 45 MG tablet TAKE 1 TABLET BY MOUTH DAILY. 90 tablet 1  . Polyethyl Glycol-Propyl Glycol (SYSTANE OP) Apply to eye as needed. As directed for dry eyes    . prednisoLONE acetate (PRED FORTE) 1 % ophthalmic suspension Place 1 drop into the left eye 2 (two) times daily.     . Wheat Dextrin (BENEFIBER PO) Take 3 capsules by mouth as needed.      No current facility-administered medications on file prior to visit.     BP (!) 122/50 (BP Location: Right Arm, Patient Position: Sitting, Cuff Size: Large)   Pulse 60   Temp 97.6 F (36.4 C) (Oral)   Wt 187 lb (84.8 kg)   SpO2 97%   BMI 40.47 kg/m     Review of Systems  Constitutional: Negative.   HENT: Negative for congestion, dental problem, hearing loss, rhinorrhea, sinus pressure, sore throat and tinnitus.   Eyes: Negative for pain, discharge and visual disturbance.  Respiratory: Negative for cough and shortness of breath.   Cardiovascular: Negative for chest pain, palpitations and leg swelling.  Gastrointestinal: Negative for abdominal distention, abdominal pain, blood in stool, constipation, diarrhea, nausea and vomiting.  Genitourinary: Negative for difficulty urinating, dysuria, flank pain, frequency, hematuria, pelvic pain, urgency, vaginal bleeding, vaginal discharge and vaginal pain.  Musculoskeletal: Negative for arthralgias, gait problem and joint swelling.    Skin: Negative for rash.       Only complaint is an occasional tender varicose vein involving her left posterior calf area  Neurological: Negative for dizziness, syncope, speech difficulty, weakness, numbness and headaches.  Hematological: Negative for adenopathy.  Psychiatric/Behavioral: Negative for agitation, behavioral problems and dysphoric  mood. The patient is not nervous/anxious.        Objective:   Physical Exam  Constitutional: She appears well-developed and well-nourished. No distress.  Blood pressure well controlled  Neck: Normal range of motion. Neck supple.  Cardiovascular: Normal rate and regular rhythm.  Pulmonary/Chest: Effort normal and breath sounds normal.  Skin:  Slightly tender dilated varicosity left posterior calf          Assessment & Plan:   Diabetes mellitus.  Well-controlled on dual therapy only.  No change in therapy follow-up in globin A1c in 4 to 6 months Hypertension stable  Slightly tender varicosity left lower leg.  Will observe at this point.  The patient does have compression hose at home  Follow-up 6 months  Maximiliano Cromartie Homero Fellers

## 2017-09-27 ENCOUNTER — Telehealth: Payer: Self-pay | Admitting: Internal Medicine

## 2017-09-27 DIAGNOSIS — M7501 Adhesive capsulitis of right shoulder: Secondary | ICD-10-CM

## 2017-09-27 DIAGNOSIS — G8929 Other chronic pain: Secondary | ICD-10-CM

## 2017-09-27 DIAGNOSIS — M25511 Pain in right shoulder: Principal | ICD-10-CM

## 2017-09-27 NOTE — Telephone Encounter (Signed)
Copied from [place] [id number]. Topic: Quick Communication - Rx Refill/Question >> Sep 27, 2017 10:40 AM Marval Regal L wrote: Medication: metFORMIN (GLUCOPHAGE) 1000 MG tablet atorvastatin (LIPITOR) 40 MG tablet benazepril-hydrochlorthiazide pioglitazone (ACTOS) 45 MG tablet   Has the patient contacted their pharmacy?yes Preferred Pharmacy (with phone number or street name): [place], [place]. [phone] (Phone) [phone] (Fax)   Agent: Please be advised that RX refills may take up to 3 business days. We ask that you follow-up with your pharmacy.

## 2017-09-28 MED ORDER — METFORMIN HCL 1000 MG PO TABS: 1000.0000 mg | ORAL_TABLET | Freq: Two times a day (BID) | ORAL | 2 refills | Status: DC

## 2017-09-28 MED ORDER — ATORVASTATIN CALCIUM 40 MG PO TABS: 40.0000 mg | ORAL_TABLET | Freq: Every day | ORAL | 0 refills | Status: DC

## 2017-09-28 MED ORDER — PIOGLITAZONE HCL 45 MG PO TABS: 45.0000 mg | ORAL_TABLET | Freq: Every day | ORAL | 1 refills | Status: DC

## 2017-10-04 ENCOUNTER — Other Ambulatory Visit: Payer: Self-pay | Admitting: Internal Medicine

## 2017-10-04 DIAGNOSIS — M7501 Adhesive capsulitis of right shoulder: Secondary | ICD-10-CM

## 2017-10-04 DIAGNOSIS — M25511 Pain in right shoulder: Principal | ICD-10-CM

## 2017-10-04 DIAGNOSIS — G8929 Other chronic pain: Secondary | ICD-10-CM

## 2017-10-10 ENCOUNTER — Ambulatory Visit: Payer: Self-pay

## 2017-10-10 NOTE — Telephone Encounter (Signed)
Pt. called to schedule an appt. for evaluation of dizziness.  Reported 2 episodes of dizziness, following incident of head injury on 09/06/17, when metal bench fell, striking her on the head.  Reported she did not receive any cut or laceration at that time.  Denied passing out from the blow to the head.  Reported initial pain from the impact of the bench hitting her head, and some soreness following.  Reported an "episode on 7/21, that she was putting eye drops in eyes, and felt a sensation on back of neck, and then blacked out."  Pt. does not think she was out for very long, but unsure of the amt. of time.  Reported a soreness on her knee from falling, otherwise denied any other injury.  Reported 2nd episode of dizziness yesterday, while in shower.  Reported she "felt an uneasy sensation, like she was going to have another episode", but did not black out at that time.  Reported she is unsure if she moved her head a certain way, that caused her feeling dizzy.  Denied any dizziness following episode yesterday, or any dizziness today.  Reported pulse of 145/59 and pulse 76, during Triage call.  Denied chest pain, or shortness of breath.  Denied any other ill feeling.  Reported she drinks water in small amts.; does not think she drinks enough.  Appt. Given for 10/11/17 with PCP.  Care advice given per protocol.  Verb. Understanding.      Reason for Disposition . [1] MILD dizziness (e.g., walking normally) AND [2] has NOT been evaluated by physician for this  (Exception: dizziness caused by heat exposure, sudden standing, or poor fluid intake)  Answer Assessment - Initial Assessment Questions 1. DESCRIPTION: "Describe your dizziness."     Describes an uneasy sensation that she is going to have another episode 2. LIGHTHEADED: "Do you feel lightheaded?" (e.g., somewhat faint, woozy, weak upon standing)     An awareness that something is going to happen 3. VERTIGO: "Do you feel like either you or the room is spinning  or tilting?" (i.e. vertigo)     denied 4. SEVERITY: "How bad is it?"  "Do you feel like you are going to faint?" "Can you stand and walk?"   - MILD - walking normally   - MODERATE - interferes with normal activities (e.g., work, school)    - SEVERE - unable to stand, requires support to walk, feels like passing out now.     Very mild episode 5. ONSET:  "When did the dizziness begin?"     Had episode in shower yesterday; had a sensation that felt uneasy 6. AGGRAVATING FACTORS: "Does anything make it worse?" (e.g., standing, change in head position)     Unsure if anything made it worse   7. HEART RATE: "Can you tell me your heart rate?" "How many beats in 15 seconds?"  (Note: not all patients can do this)       BP 145/59, pulse 76 8. CAUSE: "What do you think is causing the dizziness?"    A metal bench hit her head on 09/06/17 9. RECURRENT SYMPTOM: "Have you had dizziness before?" If so, ask: "When was the last time?" "What happened that time?"     Had episode she blacked-out on 7/21, when trying to put eye drops in 10. OTHER SYMPTOMS: "Do you have any other symptoms?" (e.g., fever, chest pain, vomiting, diarrhea, bleeding)       Denied headache, dizziness at present; reported when she lays on pillow,  the left neck sore.  Denied chest pain or shortness of breath.     11. PREGNANCY: "Is there any chance you are pregnant?" "When was your last menstrual period?"       n/a  Protocols used: DIZZINESS [facility]

## 2017-10-11 ENCOUNTER — Ambulatory Visit (INDEPENDENT_AMBULATORY_CARE_PROVIDER_SITE_OTHER): Payer: Medicare HMO | Admitting: Internal Medicine

## 2017-10-11 ENCOUNTER — Encounter: Payer: Self-pay | Admitting: Internal Medicine

## 2017-10-11 VITALS — BP 110/60 | HR 56 | Temp 98.0°F | Wt 189.0 lb

## 2017-10-11 DIAGNOSIS — E119 Type 2 diabetes mellitus without complications: Secondary | ICD-10-CM

## 2017-10-11 DIAGNOSIS — I10 Essential (primary) hypertension: Secondary | ICD-10-CM | POA: Diagnosis not present

## 2017-10-11 NOTE — Patient Instructions (Signed)

## 2017-10-11 NOTE — Progress Notes (Signed)
Subjective:    Patient ID: Briana Patton, female    DOB: 07/04/1940, 77 y.o.   MRN: [id number]  HPI  77 year old patient who sustained some minor head trauma while rearranging storage material.  She was struck in the left parietal scalp area but did not lose her balance or fall.  This episode occurred on July 2.  On July 21 while extending her head back to place eyedrops she had an episode of vertigo and fell.  She sustained some soft tissue trauma involving her right chest wall and right knee. She was concerned about the earlier head trauma causing the dizziness and was wondering whether she needed a brain MRI. She does have a history of diabetes controlled on oral medications only.  She has essential hypertension.  Past Medical History:  Diagnosis Date  . Abdominal pain    in past  . Barrett esophagus 12/12/2000   egd bx  . Diabetes mellitus   . Diverticulosis   . Diverticulosis of colon (without mention of hemorrhage)   . GERD (gastroesophageal reflux disease)   . Hyperlipidemia   . Hypertension   . Internal hemorrhoids without mention of complication   . Osteoarthritis      Social History   Socioeconomic History  . Marital status: Married    Spouse name: Not on file  . Number of children: Not on file  . Years of education: Not on file  . Highest education level: Not on file  Occupational History  . Not on file  Social Needs  . Financial resource strain: Not on file  . Food insecurity:    Worry: Not on file    Inability: Not on file  . Transportation needs:    Medical: Not on file    Non-medical: Not on file  Tobacco Use  . Smoking status: Former Smoker    Types: Cigarettes    Last attempt to quit: 03/08/1998    Years since quitting: 19.6  . Smokeless tobacco: Never Used  Substance and Sexual Activity  . Alcohol use: No    Alcohol/week: 0.0 oz  . Drug use: No  . Sexual activity: Not on file  Lifestyle  . Physical activity:    Days per week: Not on file   Minutes per session: Not on file  . Stress: Not on file  Relationships  . Social connections:    Talks on phone: Not on file    Gets together: Not on file    Attends religious service: Not on file    Active member of club or organization: Not on file    Attends meetings of clubs or organizations: Not on file    Relationship status: Not on file  . Intimate partner violence:    Fear of current or ex partner: Not on file    Emotionally abused: Not on file    Physically abused: Not on file    Forced sexual activity: Not on file  Other Topics Concern  . Not on file  Social History Narrative   Father age 1 complications of cardiac and cerebrovascular disease          Past Surgical History:  Procedure Laterality Date  . ABDOMINAL HYSTERECTOMY    . CESAREAN SECTION     3 times  . COLONOSCOPY    . KNEE ARTHROSCOPY     left knee surgery 1999  . LUMBAR LAMINECTOMY    . UPPER GASTROINTESTINAL ENDOSCOPY      Family History  Problem Relation Age  of Onset  . Diabetes Brother   . Diabetes Mother   . Diabetes Father   . Stroke Father   . Alcohol abuse Sister        [id number]  . Epilepsy Brother   . Colon cancer Neg Hx   . Esophageal cancer Neg Hx   . Rectal cancer Neg Hx   . Stomach cancer Neg Hx     Allergies  Allergen Reactions  . Penicillins     WHELTS    Current Outpatient Medications on File Prior to Visit  Medication Sig Dispense Refill  . albuterol (PROVENTIL HFA;VENTOLIN HFA) 108 (90 BASE) MCG/ACT inhaler Inhale 2 puffs into the lungs every 6 (six) hours as needed for wheezing or shortness of breath. 1 Inhaler 0  . aspirin 81 MG tablet Take 1 tablet (81 mg total) by mouth daily. 30 tablet   . atorvastatin (LIPITOR) 40 MG tablet Take 1 tablet (40 mg total) by mouth daily. 90 tablet 0  . benazepril-hydrochlorthiazide (LOTENSIN HCT) 20-12.5 MG tablet TAKE 1 TABLET BY MOUTH ONCE DAILY 90 tablet 0  . Blood Glucose Monitoring Suppl (FREESTYLE FREEDOM LITE) W/DEVICE KIT As  directed , dx code 250.00 1 each 0  . Cholecalciferol (VITAMIN D3) 2000 units TABS Take 1 capsule by mouth daily.    . fluticasone (FLONASE) 50 MCG/ACT nasal spray Place 2 sprays into both nostrils daily. 16 g 0  . glucose blood (FREESTYLE LITE) test strip Test once daily. 100 each 3  . ketorolac (ACULAR) 0.5 % ophthalmic solution Place 1 drop into the left eye 4 (four) times daily.     . Lancets (FREESTYLE) lancets Test once daily. 100 each 5  . metFORMIN (GLUCOPHAGE) 1000 MG tablet Take 1 tablet (1,000 mg total) by mouth 2 (two) times daily with a meal. 180 tablet 2  . Multiple Vitamins-Calcium (ONE-A-DAY WOMENS FORMULA PO) Take 1 tablet by mouth daily.    . pantoprazole (PROTONIX) 40 MG tablet Take 1 tablet (40 mg total) by mouth daily. 30 tablet 2  . pioglitazone (ACTOS) 45 MG tablet Take 1 tablet (45 mg total) by mouth daily. 90 tablet 1  . Polyethyl Glycol-Propyl Glycol (SYSTANE OP) Apply to eye as needed. As directed for dry eyes    . prednisoLONE acetate (PRED FORTE) 1 % ophthalmic suspension Place 1 drop into the left eye 2 (two) times daily.     . Wheat Dextrin (BENEFIBER PO) Take 3 capsules by mouth as needed.      No current facility-administered medications on file prior to visit.     BP 110/60 (BP Location: Right Arm, Patient Position: Sitting, Cuff Size: Large)   Pulse (!) 56   Temp 98 F (36.7 C) (Oral)   Wt 189 lb (85.7 kg)   SpO2 99%   BMI 40.90 kg/m     Review of Systems  Constitutional: Negative.   HENT: Negative for congestion, dental problem, hearing loss, rhinorrhea, sinus pressure, sore throat and tinnitus.   Eyes: Negative for pain, discharge and visual disturbance.  Respiratory: Negative for cough and shortness of breath.   Cardiovascular: Negative for chest pain, palpitations and leg swelling.  Gastrointestinal: Negative for abdominal distention, abdominal pain, blood in stool, constipation, diarrhea, nausea and vomiting.  Genitourinary: Negative for  difficulty urinating, dysuria, flank pain, frequency, hematuria, pelvic pain, urgency, vaginal bleeding, vaginal discharge and vaginal pain.  Musculoskeletal: Positive for neck pain and neck stiffness. Negative for arthralgias, gait problem and joint swelling.  Skin: Negative for rash.  Neurological: Positive for dizziness. Negative for syncope, speech difficulty, weakness, numbness and headaches.  Hematological: Negative for adenopathy.  Psychiatric/Behavioral: Negative for agitation, behavioral problems and dysphoric mood. The patient is not nervous/anxious.        Objective:   Physical Exam  Constitutional: She is oriented to person, place, and time. She appears well-developed and well-nourished.  HENT:  Head: Normocephalic.  Right Ear: External ear normal.  Left Ear: External ear normal.  Mouth/Throat: Oropharynx is clear and moist.  Eyes: Pupils are equal, round, and reactive to light. Conjunctivae and EOM are normal.  Neck: Normal range of motion. Neck supple. No thyromegaly present.  Cardiovascular: Normal rate, regular rhythm, normal heart sounds and intact distal pulses.  Pulmonary/Chest: Effort normal and breath sounds normal.  Abdominal: Soft. Bowel sounds are normal. She exhibits no mass. There is no tenderness.  Musculoskeletal: Normal range of motion.  Lymphadenopathy:    She has no cervical adenopathy.  Neurological: She is alert and oriented to person, place, and time. No cranial nerve deficit.  Pupillary responses normal Extraocular muscles full Normal finger-to-nose testing Normal gait No drift  Skin: Skin is warm and dry. No rash noted.  Psychiatric: She has a normal mood and affect. Her behavior is normal.          Assessment & Plan:   History of minor head trauma Episode of vertigo July 21.  This has not reoccurred.  We will continue to observe Diabetes mellitus stable Essential hypertension stable The patient will follow-up with a new primary care  provider in approximately 3 months  Medications updated  Gordy Savers

## 2017-11-02 DIAGNOSIS — H26492 Other secondary cataract, left eye: Secondary | ICD-10-CM | POA: Diagnosis not present

## 2017-11-02 DIAGNOSIS — E119 Type 2 diabetes mellitus without complications: Secondary | ICD-10-CM | POA: Diagnosis not present

## 2017-11-02 DIAGNOSIS — H2511 Age-related nuclear cataract, right eye: Secondary | ICD-10-CM | POA: Diagnosis not present

## 2017-11-02 DIAGNOSIS — H52203 Unspecified astigmatism, bilateral: Secondary | ICD-10-CM | POA: Diagnosis not present

## 2017-11-02 LAB — HM DIABETES EYE EXAM

## 2017-11-08 ENCOUNTER — Ambulatory Visit: Payer: PPO | Admitting: Internal Medicine

## 2017-11-24 DIAGNOSIS — H26492 Other secondary cataract, left eye: Secondary | ICD-10-CM | POA: Diagnosis not present

## 2018-01-06 ENCOUNTER — Other Ambulatory Visit: Payer: Self-pay | Admitting: Internal Medicine

## 2018-01-06 ENCOUNTER — Other Ambulatory Visit: Payer: Self-pay

## 2018-01-06 ENCOUNTER — Telehealth: Payer: Self-pay | Admitting: Family Medicine

## 2018-01-06 DIAGNOSIS — M25511 Pain in right shoulder: Principal | ICD-10-CM

## 2018-01-06 DIAGNOSIS — M7501 Adhesive capsulitis of right shoulder: Secondary | ICD-10-CM

## 2018-01-06 DIAGNOSIS — G8929 Other chronic pain: Secondary | ICD-10-CM

## 2018-01-06 NOTE — Telephone Encounter (Signed)
Rx refilled.

## 2018-01-06 NOTE — Telephone Encounter (Signed)
Copied from [employer] [id number]. Topic: Quick Communication - Rx Refill/Question >> Jan 06, 2018  9:14 AM Blase Mess A wrote: Medication: metFORMIN (GLUCOPHAGE) 1000 MG tablet [[id number]] atorvastatin (LIPITOR) 40 MG tablet [[id number]] ,pioglitazone (ACTOS) 45 MG tablet [[id number]] benazepril-hydrochlorthiazide (LOTENSIN HCT) 20-12.5 MG tablet [[id number]]   Has the patient contacted their pharmacy? Yes (Agent: If no, request that the patient contact the pharmacy for the refill.) (Agent: If yes, when and what did the pharmacy advise?)  Preferred Pharmacy (with phone number or street name): [place], [address] [place]. [place] [address] Phone: [phone] Fax: [phone]    Agent: Please be advised that RX refills may take up to 3 business days. We ask that you follow-up with your pharmacy.

## 2018-01-18 ENCOUNTER — Encounter: Payer: Self-pay | Admitting: Family Medicine

## 2018-01-18 ENCOUNTER — Ambulatory Visit (INDEPENDENT_AMBULATORY_CARE_PROVIDER_SITE_OTHER): Payer: Medicare HMO | Admitting: Family Medicine

## 2018-01-18 VITALS — BP 118/52 | HR 58 | Temp 98.7°F | Wt 182.0 lb

## 2018-01-18 DIAGNOSIS — E119 Type 2 diabetes mellitus without complications: Secondary | ICD-10-CM | POA: Diagnosis not present

## 2018-01-18 DIAGNOSIS — I10 Essential (primary) hypertension: Secondary | ICD-10-CM

## 2018-01-18 DIAGNOSIS — K219 Gastro-esophageal reflux disease without esophagitis: Secondary | ICD-10-CM

## 2018-01-18 LAB — POCT GLYCOSYLATED HEMOGLOBIN (HGB A1C): Hemoglobin A1C: 5.9 % — AB (ref 4.0–5.6)

## 2018-01-18 NOTE — Patient Instructions (Signed)
Food Choices for Gastroesophageal Reflux Disease, Adult When you have gastroesophageal reflux disease (GERD), the foods you eat and your eating habits are very important. Choosing the right foods can help ease your discomfort. What guidelines do I need to follow?  Choose fruits, vegetables, whole grains, and low-fat dairy products.  Choose low-fat meat, fish, and poultry.  Limit fats such as oils, salad dressings, butter, nuts, and avocado.  Keep a food diary. This helps you identify foods that cause symptoms.  Avoid foods that cause symptoms. These may be different for everyone.  Eat small meals often instead of 3 large meals a day.  Eat your meals slowly, in a place where you are relaxed.  Limit fried foods.  Cook foods using methods other than frying.  Avoid drinking alcohol.  Avoid drinking large amounts of liquids with your meals.  Avoid bending over or lying down until 2-3 hours after eating. What foods are not recommended? These are some foods and drinks that may make your symptoms worse: Vegetables Tomatoes. Tomato juice. Tomato and spaghetti sauce. Chili peppers. Onion and garlic. Horseradish. Fruits Oranges, grapefruit, and lemon (fruit and juice). Meats High-fat meats, fish, and poultry. This includes hot dogs, ribs, ham, sausage, salami, and bacon. Dairy Whole milk and chocolate milk. Sour cream. Cream. Butter. Ice cream. Cream cheese. Drinks Coffee and tea. Bubbly (carbonated) drinks or energy drinks. Condiments Hot sauce. Barbecue sauce. Sweets/Desserts Chocolate and cocoa. Donuts. Peppermint and spearmint. Fats and Oils High-fat foods. This includes [place] fries and potato chips. Other Vinegar. Strong spices. This includes black pepper, white pepper, red pepper, cayenne, curry powder, cloves, ginger, and chili powder. The items listed above may not be a complete list of foods and drinks to avoid. Contact your dietitian for more information. This  information is not intended to replace advice given to you by your health care provider. Make sure you discuss any questions you have with your health care provider. Document Released: 08/24/2011 Document Revised: 07/31/2015 Document Reviewed: 12/27/2012 Elsevier Interactive Patient Education  2017 [place].  How to Avoid Diabetes Mellitus Problems You can take action to prevent or slow down problems that are caused by diabetes (diabetes mellitus). Following your diabetes plan and taking care of yourself can reduce your risk of serious or life-threatening complications. Manage your diabetes  Follow instructions from your health care providers about managing your diabetes. Your diabetes may be managed by a team of health care providers who can teach you how to care for yourself and can answer questions that you have.  Educate yourself about your condition so you can make healthy choices about eating and physical activity.  Check your blood sugar (glucose) levels as often as directed. Your health care provider will help you decide how often to check your blood glucose level depending on your treatment goals and how well you are meeting them.  Ask your health care provider if you should take low-dose aspirin daily and what dose is recommended for you. Taking low-dose aspirin daily is recommended to help prevent cardiovascular disease. Do not use nicotine or tobacco Do not use any products that contain nicotine or tobacco, such as cigarettes and e-cigarettes. If you need help quitting, ask your health care provider. Nicotine raises your risk for diabetes problems. If you quit using nicotine:  You will lower your risk for heart attack, stroke, nerve disease, and kidney disease.  Your cholesterol and blood pressure may improve.  Your blood circulation will improve.  Keep your blood pressure under  control To control your blood pressure:  Follow instructions from your health care provider  about meal planning, exercise, and medicines.  Make sure your health care provider checks your blood pressure at every medical visit.  A blood pressure reading consists of two numbers. Generally, the goal is to keep your top number (systolic pressure) at or below 130, and your bottom number (diastolic pressure) at or below 80. Your health care provider may recommend a lower target blood pressure. Your individualized target blood pressure is determined based on:  Your age.  Your medicines.  How long you have had diabetes.  Any other medical conditions you have.  Keep your cholesterol under control To control your cholesterol:  Follow instructions from your health care provider about meal planning, exercise, and medicines.  Have your cholesterol checked at least once a year.  You may be prescribed medicine to lower cholesterol (statin). If you are not taking a statin, ask your health care provider if you should be.  Controlling your cholesterol may:  Help prevent heart disease and stroke. These are the most common health problems for people with diabetes.  Improve your blood flow.  Schedule and keep yearly physical exams and eye exams Your health care provider will tell you how often you need medical visits depending on your diabetes management plan. Keep all follow-up visits as directed. This is important so possible problems can be identified early and complications can be avoided or treated.  Every visit with your health care provider should include measuring your: ? Weight. ? Blood pressure. ? Blood glucose control.  Your A1c (hemoglobin A1c) level should be checked: ? At least 2 times a year, if you are meeting your treatment goals. ? 4 times a year, if you are not meeting treatment goals or if your treatment goals have changed.  Your blood lipids (lipid profile) should be checked yearly. You should also be checked yearly for protein in your urine (urine  microalbumin).  If you have type 1 diabetes, get an eye exam 3-5 years after you are diagnosed, and then once a year after your first exam.  If you have type 2 diabetes, get an eye exam as soon as you are diagnosed, and then once a year after your first exam.  Keep your vaccines current It is recommended that you receive:  A flu (influenza) vaccine every year.  A pneumonia (pneumococcal) vaccine and a hepatitis B vaccine. If you are age 92 or older, you may get the pneumonia vaccine as a series of two separate shots.  Ask your health care provider which other vaccines may be recommended. Take care of your feet Diabetes may cause you to have poor blood circulation to your legs and feet. Because of this, taking care of your feet is very important. Diabetes can cause:  The skin on the feet to get thinner, break more easily, and heal more slowly.  Nerve damage in your legs and feet, which results in decreased feeling. You may not notice minor injuries that could lead to serious problems.  To avoid foot problems:  Check your skin and feet every day for cuts, bruises, redness, blisters, or sores.  Schedule a foot exam with your health care provider once every year. This exam includes: ? Inspecting of the structure and skin of your feet. ? Checking the pulses and sensation in your feet.  Make sure that your health care provider performs a visual foot exam at every medical visit.  Take care of your  teeth People with poorly controlled diabetes are more likely to have gum (periodontal) disease. Diabetes can make periodontal diseases harder to control. If not treated, periodontal diseases can lead to tooth loss. To prevent this:  Brush your teeth twice a day.  Floss at least once a day.  Visit your dentist 2 times a year.  Drink responsibly Limit alcohol intake to no more than 1 drink a day for nonpregnant women and 2 drinks a day for men. One drink equals 12 oz of beer, 5 oz of wine,  or 1 oz of hard liquor. It is important to eat food when you drink alcohol to avoid low blood glucose (hypoglycemia). Avoid alcohol if you:  Have a history of alcohol abuse or dependence.  Are pregnant.  Have liver disease, pancreatitis, advanced neuropathy, or severe hypertriglyceridemia.  Lessen stress Living with diabetes can be stressful. When you are experiencing stress, your blood glucose may be affected in two ways:  Stress hormones may cause your blood glucose to rise.  You may be distracted from taking good care of yourself.  Be aware of your stress level and make changes to help you manage challenging situations. To lower your stress levels:  Consider joining a support group.  Do planned relaxation or meditation.  Do a hobby that you enjoy.  Maintain healthy relationships.  Exercise regularly.  Work with your health care provider or a mental health professional.  Summary  You can take action to prevent or slow down problems that are caused by diabetes (diabetes mellitus). Following your diabetes plan and taking care of yourself can reduce your risk of serious or life-threatening complications.  Follow instructions from your health care providers about managing your diabetes. Your diabetes may be managed by a team of health care providers who can teach you how to care for yourself and can answer questions that you have.  Your health care provider will tell you how often you need medical visits depending on your diabetes management plan. Keep all follow-up visits as directed. This is important so possible problems can be identified early and complications can be avoided or treated. This information is not intended to replace advice given to you by your health care provider. Make sure you discuss any questions you have with your health care provider. Document Released: 11/10/2010 Document Revised: 11/22/2015 Document Reviewed: 11/22/2015 Elsevier Interactive Patient  Education  2018 [place] and Stress Management Stress is a normal reaction to life events. It is what you feel when life demands more than you are used to or more than you can handle. Some stress can be useful. For example, the stress reaction can help you catch the last bus of the day, study for a test, or meet a deadline at work. But stress that occurs too often or for too long can cause problems. It can affect your emotional health and interfere with relationships and normal daily activities. Too much stress can weaken your immune system and increase your risk for physical illness. If you already have a medical problem, stress can make it worse. What are the causes? All sorts of life events may cause stress. An event that causes stress for one person may not be stressful for another person. Major life events commonly cause stress. These may be positive or negative. Examples include losing your job, moving into a new home, getting married, having a baby, or losing a loved one. Less obvious life events may also cause stress, especially if they occur  day after day or in combination. Examples include working long hours, driving in traffic, caring for children, being in debt, or being in a difficult relationship. What are the signs or symptoms? Stress may cause emotional symptoms including, the following:  Anxiety. This is feeling worried, afraid, on edge, overwhelmed, or out of control.  Anger. This is feeling irritated or impatient.  Depression. This is feeling sad, down, helpless, or guilty.  Difficulty focusing, remembering, or making decisions.  Stress may cause physical symptoms, including the following:  Aches and pains. These may affect your head, neck, back, stomach, or other areas of your body.  Tight muscles or clenched jaw.  Low energy or trouble sleeping.  Stress may cause unhealthy behaviors, including the following:  Eating to feel better (overeating) or skipping  meals.  Sleeping too little, too much, or both.  Working too much or putting off tasks (procrastination).  Smoking, drinking alcohol, or using drugs to feel better.  How is this diagnosed? Stress is diagnosed through an assessment by your health care provider. Your health care provider will ask questions about your symptoms and any stressful life events.Your health care provider will also ask about your medical history and may order blood tests or other tests. Certain medical conditions and medicine can cause physical symptoms similar to stress. Mental illness can cause emotional symptoms and unhealthy behaviors similar to stress. Your health care provider may refer you to a mental health professional for further evaluation. How is this treated? Stress management is the recommended treatment for stress.The goals of stress management are reducing stressful life events and coping with stress in healthy ways. Techniques for reducing stressful life events include the following:  Stress identification. Self-monitor for stress and identify what causes stress for you. These skills may help you to avoid some stressful events.  Time management. Set your priorities, keep a calendar of events, and learn to say "no." These tools can help you avoid making too many commitments.  Techniques for coping with stress include the following:  Rethinking the problem. Try to think realistically about stressful events rather than ignoring them or overreacting. Try to find the positives in a stressful situation rather than focusing on the negatives.  Exercise. Physical exercise can release both physical and emotional tension. The key is to find a form of exercise you enjoy and do it regularly.  Relaxation techniques. These relax the body and mind. Examples include yoga, meditation, tai chi, biofeedback, deep breathing, progressive muscle relaxation, listening to music, being out in nature, journaling, and other  hobbies. Again, the key is to find one or more that you enjoy and can do regularly.  Healthy lifestyle. Eat a balanced diet, get plenty of sleep, and do not smoke. Avoid using alcohol or drugs to relax.  Strong support network. Spend time with family, friends, or other people you enjoy being around.Express your feelings and talk things over with someone you trust.  Counseling or talktherapy with a mental health professional may be helpful if you are having difficulty managing stress on your own. Medicine is typically not recommended for the treatment of stress.Talk to your health care provider if you think you need medicine for symptoms of stress. Follow these instructions at home:  Keep all follow-up visits as directed by your health care provider.  Take all medicines as directed by your health care provider. Contact a health care provider if:  Your symptoms get worse or you start having new symptoms.  You feel overwhelmed by your  problems and can no longer manage them on your own. Get help right away if:  You feel like hurting yourself or someone else. This information is not intended to replace advice given to you by your health care provider. Make sure you discuss any questions you have with your health care provider. Document Released: 08/18/2000 Document Revised: 07/31/2015 Document Reviewed: 10/17/2012 Elsevier Interactive Patient Education  2017 [employer].

## 2018-01-18 NOTE — Progress Notes (Signed)
Subjective:    Patient ID: Briana [place], female    DOB: [birth year], 77 y.o.   MRN: [id number]  Chief Complaint  Patient presents with  . Transfer of Care    HPI Patient was seen today for f/u on chronic conditions and TOC, previously seen by Dr. Lesia Hausen.  Patient endorses increased stress as her grandson is blind and nonverbal.  Patient also notes concern about her daughter and her choice in men.  HTN: -Taking benazepril-hydrochlorothiazide daily -Denies headaches, chest pain, changes in vision  Diabetes type 2: -Pt taking metformin 1000 mg twice daily and Actos 45 mg daily -Trying to eat better. -Last hemoglobin A1c was 6.2 on 08/03/2017  GERD: -Taking Protonix 40 mg daily -Trying to avoid foods known to cause problems.    Past Medical History:  Diagnosis Date  . Abdominal pain    in past  . Barrett esophagus 12/12/2000   egd bx  . Diabetes mellitus   . Diverticulosis   . Diverticulosis of colon (without mention of hemorrhage)   . GERD (gastroesophageal reflux disease)   . Hyperlipidemia   . Hypertension   . Internal hemorrhoids without mention of complication   . Osteoarthritis     Allergies  Allergen Reactions  . Penicillins     WHELTS    ROS General: Denies fever, chills, night sweats, changes in weight, changes in appetite HEENT: Denies headaches, ear pain, changes in vision, rhinorrhea, sore throat CV: Denies CP, palpitations, SOB, orthopnea Pulm: Denies SOB, cough, wheezing GI: Denies abdominal pain, nausea, vomiting, diarrhea, constipation GU: Denies dysuria, hematuria, frequency, vaginal discharge Msk: Denies muscle cramps, joint pains Neuro: Denies weakness, numbness, tingling Skin: Denies rashes, bruising Psych: Denies depression, anxiety, hallucinations      Objective:    Blood pressure (!) 118/52, pulse (!) 58, temperature 98.7 F (37.1 C), temperature source Oral, weight 182 lb (82.6 kg), SpO2 98 %.  Gen. Pleasant, well-nourished, in  no distress, normal affect  HEENT: [place]/AT, face symmetric, no scleral icterus, PERRLA, nares patent without drainage, pharynx without erythema or exudate. Lungs: no accessory muscle use, CTAB, no wheezes or rales Cardiovascular: RRR, no m/r/g, no peripheral edema Abdomen: BS present, soft, NT/ND Neuro:  A&Ox3, CN II-XII intact, normal gait  Wt Readings from Last 3 Encounters:  01/18/18 182 lb (82.6 kg)  10/11/17 189 lb (85.7 kg)  08/03/17 187 lb (84.8 kg)    Lab Results  Component Value Date   WBC 4.7 05/04/2017   HGB 10.3 (L) 05/04/2017   HCT 31.2 (L) 05/04/2017   PLT 157.0 05/04/2017   GLUCOSE 95 11/03/2016   CHOL 142 11/03/2016   TRIG 117.0 11/03/2016   HDL 54.30 11/03/2016   LDLDIRECT 142.1 05/26/2012   LDLCALC 64 11/03/2016   ALT 12 11/03/2016   AST 15 11/03/2016   NA 138 11/03/2016   K 4.9 11/03/2016   CL 102 11/03/2016   CREATININE 1.03 11/03/2016   BUN 32 (H) 11/03/2016   CO2 32 11/03/2016   TSH 4.97 (H) 11/03/2016   HGBA1C 6.2 (A) 08/03/2017   MICROALBUR <0.7 11/03/2016    Assessment/Plan:  Essential hypertension -Controlled -Continue benazepril-hydrochlorothiazide 20-12.5 mg daily -Discussed lifestyle modifications  Gastroesophageal reflux disease, esophagitis presence not specified -Continue Protonix 40 mg daily  Diabetes mellitus without complication (HCC)  -Continue metformin 1000 mg twice daily and Actos 45 mg daily. -Discussed lifestyle modifications -Given handout - Plan: POCT glycosylated hemoglobin (Hb A1C) 5.9% -We will need foot exam at next [facility]  Follow-up in 3 to  4 months.  We will also obtain cholesterol when patient is fasting.  Abbe Amsterdam, MD

## 2018-04-20 ENCOUNTER — Ambulatory Visit: Payer: Medicare HMO | Admitting: Family Medicine

## 2018-11-02 ENCOUNTER — Other Ambulatory Visit (HOSPITAL_COMMUNITY): Payer: Self-pay | Admitting: Family Medicine

## 2018-11-02 DIAGNOSIS — I739 Peripheral vascular disease, unspecified: Secondary | ICD-10-CM

## 2018-11-07 ENCOUNTER — Other Ambulatory Visit: Payer: Self-pay

## 2018-11-07 ENCOUNTER — Ambulatory Visit (HOSPITAL_COMMUNITY)
Admission: RE | Admit: 2018-11-07 | Discharge: 2018-11-07 | Disposition: A | Discharge status: Home / Self Care | Payer: Medicare Other | Source: Ambulatory Visit | Attending: Family Medicine | Admitting: Family Medicine

## 2018-11-07 DIAGNOSIS — I739 Peripheral vascular disease, unspecified: Secondary | ICD-10-CM | POA: Diagnosis not present

## 2018-11-07 NOTE — Progress Notes (Signed)
ABI completed. Refer to "CV Proc" under chart review to view preliminary results.  11/07/2018 1:28 PM Maudry Mayhew, MHA, RVT, RDCS, RDMS

## 2019-10-04 ENCOUNTER — Telehealth: Payer: Self-pay | Admitting: Family Medicine

## 2019-10-04 NOTE — Telephone Encounter (Signed)
ok 

## 2019-10-04 NOTE — Telephone Encounter (Signed)
Patient is requesting a TOC from [place] to [place].  Please Advise

## 2019-10-05 NOTE — Telephone Encounter (Signed)
Ok

## 2019-10-12 ENCOUNTER — Other Ambulatory Visit: Payer: Self-pay

## 2019-10-12 ENCOUNTER — Ambulatory Visit (HOSPITAL_COMMUNITY)
Admission: RE | Admit: 2019-10-12 | Discharge: 2019-10-12 | Disposition: A | Discharge status: Home / Self Care | Payer: Medicare Other | Source: Ambulatory Visit | Attending: Chiropractor | Admitting: Chiropractor

## 2019-10-12 ENCOUNTER — Other Ambulatory Visit (HOSPITAL_COMMUNITY): Payer: Self-pay | Admitting: Chiropractor

## 2019-10-12 DIAGNOSIS — R52 Pain, unspecified: Secondary | ICD-10-CM | POA: Diagnosis not present

## 2019-11-07 ENCOUNTER — Ambulatory Visit: Payer: Medicare Other | Admitting: Internal Medicine

## 2019-12-04 ENCOUNTER — Ambulatory Visit: Payer: Medicare Other | Admitting: Orthopaedic Surgery

## 2019-12-04 ENCOUNTER — Ambulatory Visit: Payer: Self-pay

## 2019-12-04 ENCOUNTER — Encounter: Payer: Self-pay | Admitting: Physician Assistant

## 2019-12-04 DIAGNOSIS — M25512 Pain in left shoulder: Secondary | ICD-10-CM | POA: Diagnosis not present

## 2019-12-04 DIAGNOSIS — G8929 Other chronic pain: Secondary | ICD-10-CM

## 2019-12-04 MED ORDER — METHYLPREDNISOLONE ACETATE 40 MG/ML IJ SUSP
40.0000 mg | INTRAMUSCULAR | Status: AC | PRN
  Administered 2019-12-04: 40 mg via INTRA_ARTICULAR

## 2019-12-04 MED ORDER — BUPIVACAINE HCL 0.5 % IJ SOLN
3.0000 mL | INTRAMUSCULAR | Status: AC | PRN
  Administered 2019-12-04: 3 mL via INTRA_ARTICULAR

## 2019-12-04 MED ORDER — LIDOCAINE HCL 1 % IJ SOLN
3.0000 mL | INTRAMUSCULAR | Status: AC | PRN
  Administered 2019-12-04: 3 mL

## 2019-12-04 NOTE — Progress Notes (Signed)
Office Visit Note   Patient: Briana Patton           Date of Birth: 02-11-1941           MRN: [id number] Visit Date: 12/04/2019              Requested by: Deeann Saint, MD [address] [place],  [place] [address] PCP: Deeann Saint, MD   Assessment & Plan: Visit Diagnoses:  1. 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  We will proceed with left shoulder subacromial cortisone injection today.  If she still has pain and limitation in range of motion over the next several weeks she will call us and let us know we will likely refer her to Dr. Prince Rome for ultrasound-guided glenohumeral cortisone injection.  Follow-Up Instructions: Return if symptoms worsen or fail to improve.   Orders:  Orders Placed This Encounter  Procedures   XR Shoulder Left   No orders of the defined types were placed in this encounter.     Procedures: Large Joint Inj: L subacromial bursa on 12/04/2019 5:14 PM Indications: pain Details: 22 G needle  Arthrogram: No  Medications: 3 mL lidocaine 1 %; 3 mL bupivacaine 0.5 %; 40 mg methylPREDNISolone acetate 40 MG/ML Outcome: tolerated well, no immediate complications Patient was prepped and draped in the usual sterile fashion.       Clinical Data: No additional findings.   Subjective: Chief Complaint  Patient presents with   Left Shoulder - Pain   Neck - Pain    HPI patient is a very pleasant 79 year old female who comes in today with an injury to the left shoulder.  About 1 to 2 months ago, she was lying supine in her bed when her grandson tried to jump over her and she caught him with the left arm causing her left arm to go into an axially rotated position.  She had immediate pain and felt a pop but was unsure if this was to her shoulder or her neck.  She was seen soon after by the chiropractor where she had x-rays of her neck.  He did a few treatments on her  which did seem to help her neck pain.  She has had continued pain however to the shoulder.  Her pain is increased with internal and external rotation.  She denies any numbness, tingling or burning.  She does note decreased strength of the left upper extremity.  Review of Systems as detailed in HPI.  All others reviewed and are negative.   Objective: Vital Signs: There were no vitals taken for this visit.  Physical Exam well-developed well-nourished female no acute distress.  Alert and oriented x3.  Ortho Exam examination of her left shoulder reveals near full forward flexion.  She does have slight limitation with internal and external rotation.  Positive empty can with decreased strength.  She has decreased strength with resisted internal and external rotation.  Negative cross body adduction.  She does have mild tenderness to the mid clavicle.  No tenderness the AC joint.  She is neurovascular intact distally. Specialty Comments:  No specialty comments available.  Imaging: XR Shoulder Left  Result Date: 12/04/2019 No acute or structural abnormalities    PMFS History: Patient Active Problem List   Diagnosis Date Noted   Chronic right shoulder pain 04/14/2016   GERD (gastroesophageal reflux disease) 06/03/2011   Short-segment Barrett's esophagus 06/03/2011   IBS (  irritable bowel syndrome) 06/03/2011   VERTIGO, PERIPHERAL 01/17/2009   GERD 06/25/2008   Osteoarthritis 09/04/2007   PALPITATIONS, OCCASIONAL 09/04/2007   Diabetes mellitus without complication (HCC) 07/26/2006   Dyslipidemia 07/26/2006   Essential hypertension 07/26/2006   DIVERTICULOSIS, COLON 07/26/2006   ARTHROSCOPY, LEFT KNEE, HX OF 07/26/2006   Past Medical History:  Diagnosis Date   Abdominal pain    in past   Barrett esophagus 12/12/2000   egd bx   Diabetes mellitus    Diverticulosis    Diverticulosis of colon (without mention of hemorrhage)    GERD (gastroesophageal reflux disease)      Hyperlipidemia    Hypertension    Internal hemorrhoids without mention of complication    Osteoarthritis     Family History  Problem Relation Age of Onset   Diabetes Brother    Diabetes Mother    Diabetes Father    Stroke Father    Alcohol abuse Sister        X2   Epilepsy Brother    Colon cancer Neg Hx    Esophageal cancer Neg Hx    Rectal cancer Neg Hx    Stomach cancer Neg Hx     Past Surgical History:  Procedure Laterality Date   ABDOMINAL HYSTERECTOMY     CESAREAN SECTION     3 times   COLONOSCOPY     KNEE ARTHROSCOPY     left knee surgery 1999   LUMBAR LAMINECTOMY     UPPER GASTROINTESTINAL ENDOSCOPY     Social History   Occupational History   Not on file  Tobacco Use   Smoking status: Former Smoker    Types: Cigarettes    Quit date: 03/08/1998    Years since quitting: 21.7   Smokeless tobacco: Never Used  Substance and Sexual Activity   Alcohol use: No    Alcohol/week: 0.0 standard drinks   Drug use: No   Sexual activity: Not on file

## 2019-12-19 ENCOUNTER — Telehealth: Payer: Self-pay | Admitting: Orthopaedic Surgery

## 2019-12-19 NOTE — Telephone Encounter (Signed)
See message.

## 2019-12-19 NOTE — Telephone Encounter (Signed)
Last inj was given 12/04/2019.

## 2019-12-19 NOTE — Telephone Encounter (Signed)
Patient called requesting for nurse to give a call back. Patient has medical questions about cortisone shot and how earlier can she receive another. Please call patient at [phone].

## 2019-12-20 NOTE — Telephone Encounter (Signed)
Spoke to patient.  Kathlee Nations, can you refer to hilts for gh injection left shoulder

## 2020-04-10 DIAGNOSIS — H11823 Conjunctivochalasis, bilateral: Secondary | ICD-10-CM | POA: Diagnosis not present

## 2020-04-24 DIAGNOSIS — G8929 Other chronic pain: Secondary | ICD-10-CM | POA: Diagnosis not present

## 2020-04-24 DIAGNOSIS — R030 Elevated blood-pressure reading, without diagnosis of hypertension: Secondary | ICD-10-CM | POA: Diagnosis not present

## 2020-04-24 DIAGNOSIS — E1165 Type 2 diabetes mellitus with hyperglycemia: Secondary | ICD-10-CM | POA: Diagnosis not present

## 2020-04-24 DIAGNOSIS — M25562 Pain in left knee: Secondary | ICD-10-CM | POA: Diagnosis not present

## 2020-04-24 DIAGNOSIS — M25512 Pain in left shoulder: Secondary | ICD-10-CM | POA: Diagnosis not present

## 2020-05-06 DIAGNOSIS — I10 Essential (primary) hypertension: Secondary | ICD-10-CM | POA: Diagnosis not present

## 2020-05-06 DIAGNOSIS — E1165 Type 2 diabetes mellitus with hyperglycemia: Secondary | ICD-10-CM | POA: Diagnosis not present

## 2020-06-06 DIAGNOSIS — E1165 Type 2 diabetes mellitus with hyperglycemia: Secondary | ICD-10-CM | POA: Diagnosis not present

## 2020-08-15 ENCOUNTER — Emergency Department (HOSPITAL_COMMUNITY): Payer: Medicare Other

## 2020-08-15 ENCOUNTER — Emergency Department (HOSPITAL_COMMUNITY)
Admission: EM | Admit: 2020-08-15 | Discharge: 2020-08-15 | Disposition: A | Discharge status: Home / Self Care | Payer: Medicare Other | Attending: Emergency Medicine | Admitting: Emergency Medicine

## 2020-08-15 DIAGNOSIS — Z87891 Personal history of nicotine dependence: Secondary | ICD-10-CM | POA: Insufficient documentation

## 2020-08-15 DIAGNOSIS — Z79899 Other long term (current) drug therapy: Secondary | ICD-10-CM | POA: Insufficient documentation

## 2020-08-15 DIAGNOSIS — S40022A Contusion of left upper arm, initial encounter: Secondary | ICD-10-CM

## 2020-08-15 DIAGNOSIS — E119 Type 2 diabetes mellitus without complications: Secondary | ICD-10-CM | POA: Diagnosis not present

## 2020-08-15 DIAGNOSIS — Z043 Encounter for examination and observation following other accident: Secondary | ICD-10-CM | POA: Diagnosis not present

## 2020-08-15 DIAGNOSIS — S161XXA Strain of muscle, fascia and tendon at neck level, initial encounter: Secondary | ICD-10-CM

## 2020-08-15 DIAGNOSIS — S0003XA Contusion of scalp, initial encounter: Secondary | ICD-10-CM | POA: Diagnosis not present

## 2020-08-15 DIAGNOSIS — T07XXXA Unspecified multiple injuries, initial encounter: Secondary | ICD-10-CM | POA: Diagnosis not present

## 2020-08-15 DIAGNOSIS — S0101XA Laceration without foreign body of scalp, initial encounter: Secondary | ICD-10-CM

## 2020-08-15 DIAGNOSIS — I10 Essential (primary) hypertension: Secondary | ICD-10-CM | POA: Diagnosis not present

## 2020-08-15 DIAGNOSIS — S0990XA Unspecified injury of head, initial encounter: Secondary | ICD-10-CM | POA: Diagnosis not present

## 2020-08-15 DIAGNOSIS — Z7984 Long term (current) use of oral hypoglycemic drugs: Secondary | ICD-10-CM | POA: Diagnosis not present

## 2020-08-15 DIAGNOSIS — R6889 Other general symptoms and signs: Secondary | ICD-10-CM | POA: Diagnosis not present

## 2020-08-15 DIAGNOSIS — R58 Hemorrhage, not elsewhere classified: Secondary | ICD-10-CM | POA: Diagnosis not present

## 2020-08-15 DIAGNOSIS — Z743 Need for continuous supervision: Secondary | ICD-10-CM | POA: Diagnosis not present

## 2020-08-15 DIAGNOSIS — M79603 Pain in arm, unspecified: Secondary | ICD-10-CM | POA: Diagnosis not present

## 2020-08-15 DIAGNOSIS — Z7982 Long term (current) use of aspirin: Secondary | ICD-10-CM | POA: Diagnosis not present

## 2020-08-15 DIAGNOSIS — M542 Cervicalgia: Secondary | ICD-10-CM | POA: Diagnosis not present

## 2020-08-15 MED ORDER — IBUPROFEN 200 MG PO TABS
200.0000 mg | ORAL_TABLET | Freq: Once | ORAL | Status: AC
  Administered 2020-08-15: 200 mg via ORAL
  Filled 2020-08-15: qty 1

## 2020-08-15 MED ORDER — ACETAMINOPHEN 500 MG PO TABS
1000.0000 mg | ORAL_TABLET | Freq: Once | ORAL | Status: AC
  Administered 2020-08-15: 1000 mg via ORAL
  Filled 2020-08-15: qty 2

## 2020-08-15 NOTE — Discharge Instructions (Signed)
It was our pleasure to provide your ER care today - we hope that you feel better.  Take acetaminophen or ibuprofen as need for pain.   Keep wound very clean - may shower, pad area gently dry. Have staples removed, your doctor or urgent care, in 7-10 days.   Return to ER if worse, new symptoms, new, severe or intractable pain, numbness/weakness, infection of wound, or other concern.
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
## 2020-08-21 DIAGNOSIS — S0101XD Laceration without foreign body of scalp, subsequent encounter: Secondary | ICD-10-CM | POA: Diagnosis not present

## 2020-09-01 ENCOUNTER — Other Ambulatory Visit: Payer: Self-pay

## 2020-09-01 ENCOUNTER — Ambulatory Visit: Payer: Medicare Other | Admitting: Orthopedic Surgery

## 2020-09-01 ENCOUNTER — Encounter: Payer: Self-pay | Admitting: Orthopedic Surgery

## 2020-09-01 DIAGNOSIS — M7502 Adhesive capsulitis of left shoulder: Secondary | ICD-10-CM | POA: Diagnosis not present

## 2020-09-01 MED ORDER — LIDOCAINE HCL 1 % IJ SOLN
5.0000 mL | INTRAMUSCULAR | Status: AC | PRN
  Administered 2020-09-01: 5 mL

## 2020-09-01 MED ORDER — METHYLPREDNISOLONE ACETATE 40 MG/ML IJ SUSP
40.0000 mg | INTRAMUSCULAR | Status: AC | PRN
  Administered 2020-09-01: 40 mg via INTRA_ARTICULAR
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
## 2020-09-22 ENCOUNTER — Ambulatory Visit (INDEPENDENT_AMBULATORY_CARE_PROVIDER_SITE_OTHER): Payer: Medicare Other | Admitting: Physician Assistant

## 2020-09-22 ENCOUNTER — Other Ambulatory Visit: Payer: Self-pay

## 2020-09-22 DIAGNOSIS — M7502 Adhesive capsulitis of left shoulder: Secondary | ICD-10-CM | POA: Diagnosis not present

## 2020-09-22 NOTE — Progress Notes (Signed)
Office Visit Note   Patient: Briana Patton           Date of Birth: 1940/10/27           MRN: [id number] Visit Date: 09/22/2020              Requested by: Adrienne Mocha, PA [address]  [place],  [place] [address] PCP: Adrienne Mocha, PA  Chief Complaint  Patient presents with   Left Shoulder - Pain      HPI: Patient presents in follow-up today for her left shoulder.  At her last visit she was given an injection which she did think helped somewhat.  Assessment & Plan: Visit Diagnoses:  1. Adhesive capsulitis of left shoulder     Plan: I have advised the patient to go forward and try some physical therapy.  She will follow-up in 1 month.  Follow-Up Instructions: No follow-ups on file.   Ortho Exam  Patient is alert, oriented, no adenopathy, well-dressed, normal affect, normal respiratory effort. Examination of her left shoulder she has forward elevation to almost equivalent to her unaffected side she has internal rotation is still stiff and she internally rotates to just her buttock.  She does have strength is fair.  With resisted abduction.  Imaging: No results found. No images are attached to the encounter.  Labs: Lab Results  Component Value Date   HGBA1C 5.9 (A) 01/18/2018   HGBA1C 6.2 (A) 08/03/2017   HGBA1C 5.7 05/04/2017     Lab Results  Component Value Date   ALBUMIN 4.3 11/03/2016   ALBUMIN 4.2 08/12/2015   ALBUMIN 3.8 06/07/2014    No results found for: MG No results found for: VD25OH  No results found for: PREALBUMIN CBC EXTENDED Latest Ref Rng & Units 05/04/2017 11/03/2016 08/12/2015  WBC 4.0 - 10.5 K/uL 4.7 5.4 6.1  RBC 3.87 - 5.11 Mil/uL 3.36(L) 3.46(L) 3.57(L)  HGB 12.0 - 15.0 g/dL 10.3(L) 10.7(L) 10.6(L)  HCT 36.0 - 46.0 % 31.2(L) 32.5(L) 32.3(L)  PLT 150.0 - 400.0 K/uL 157.0 183.0 195.0  NEUTROABS 1.4 - 7.7 K/uL 2.6 3.0 3.6  LYMPHSABS 0.7 - 4.0 K/uL 1.6 1.8 1.9     There is no height or weight on file to calculate  BMI.  Orders:  Orders Placed This Encounter  Procedures   Ambulatory referral to Physical Therapy   No orders of the defined types were placed in this encounter.    Procedures: No procedures performed  Clinical Data: No additional findings.  ROS:  All other systems negative, except as noted in the HPI. Review of Systems  Objective: Vital Signs: There were no vitals taken for this visit.  Specialty Comments:  No specialty comments available.  PMFS History: Patient Active Problem List   Diagnosis Date Noted   Chronic right shoulder pain 04/14/2016   GERD (gastroesophageal reflux disease) 06/03/2011   Short-segment Barrett's esophagus 06/03/2011   IBS (irritable bowel syndrome) 06/03/2011   VERTIGO, PERIPHERAL 01/17/2009   GERD 06/25/2008   Osteoarthritis 09/04/2007   PALPITATIONS, OCCASIONAL 09/04/2007   Diabetes mellitus without complication ([facility]) 07/26/2006   Dyslipidemia 07/26/2006   Essential hypertension 07/26/2006   DIVERTICULOSIS, COLON 07/26/2006   ARTHROSCOPY, LEFT KNEE, HX OF 07/26/2006   Past Medical History:  Diagnosis Date   Abdominal pain    in past   Barrett esophagus 12/12/2000   egd bx   Diabetes mellitus    Diverticulosis    Diverticulosis of colon (without mention of hemorrhage)  GERD (gastroesophageal reflux disease)    Hyperlipidemia    Hypertension    Internal hemorrhoids without mention of complication    Osteoarthritis     Family History  Problem Relation Age of Onset   Diabetes Brother    Diabetes Mother    Diabetes Father    Stroke Father    Alcohol abuse Sister        X2   Epilepsy Brother    Colon cancer Neg Hx    Esophageal cancer Neg Hx    Rectal cancer Neg Hx    Stomach cancer Neg Hx     Past Surgical History:  Procedure Laterality Date   ABDOMINAL HYSTERECTOMY     CESAREAN SECTION     3 times   COLONOSCOPY     KNEE ARTHROSCOPY     left knee surgery 1999   LUMBAR LAMINECTOMY     UPPER GASTROINTESTINAL  ENDOSCOPY     Social History   Occupational History   Not on file  Tobacco Use   Smoking status: Former    Types: Cigarettes    Quit date: 03/08/1998    Years since quitting: 22.5   Smokeless tobacco: Never  Substance and Sexual Activity   Alcohol use: No    Alcohol/week: 0.0 standard drinks   Drug use: No   Sexual activity: Not on file

## 2020-10-20 ENCOUNTER — Ambulatory Visit: Payer: Medicare Other

## 2020-10-20 ENCOUNTER — Encounter: Payer: Self-pay | Admitting: Orthopedic Surgery

## 2020-10-20 ENCOUNTER — Other Ambulatory Visit: Payer: Self-pay

## 2020-10-20 ENCOUNTER — Ambulatory Visit (INDEPENDENT_AMBULATORY_CARE_PROVIDER_SITE_OTHER): Payer: Medicare Other | Admitting: Physician Assistant

## 2020-10-20 DIAGNOSIS — M545 Low back pain, unspecified: Secondary | ICD-10-CM | POA: Diagnosis not present
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
## 2020-11-15 DIAGNOSIS — M62838 Other muscle spasm: Secondary | ICD-10-CM | POA: Diagnosis not present

## 2020-11-15 DIAGNOSIS — M5412 Radiculopathy, cervical region: Secondary | ICD-10-CM | POA: Diagnosis not present

## 2020-11-25 ENCOUNTER — Ambulatory Visit: Payer: Medicare Other | Admitting: Orthopedic Surgery

## 2020-12-02 DIAGNOSIS — I10 Essential (primary) hypertension: Secondary | ICD-10-CM | POA: Diagnosis not present

## 2020-12-02 DIAGNOSIS — Z23 Encounter for immunization: Secondary | ICD-10-CM | POA: Diagnosis not present

## 2020-12-02 DIAGNOSIS — E1165 Type 2 diabetes mellitus with hyperglycemia: Secondary | ICD-10-CM | POA: Diagnosis not present

## 2020-12-02 DIAGNOSIS — Z0000 Encounter for general adult medical examination without abnormal findings: Secondary | ICD-10-CM | POA: Diagnosis not present

## 2020-12-02 DIAGNOSIS — Z1389 Encounter for screening for other disorder: Secondary | ICD-10-CM | POA: Diagnosis not present

## 2020-12-02 DIAGNOSIS — E1142 Type 2 diabetes mellitus with diabetic polyneuropathy: Secondary | ICD-10-CM | POA: Diagnosis not present

## 2020-12-02 DIAGNOSIS — E785 Hyperlipidemia, unspecified: Secondary | ICD-10-CM | POA: Diagnosis not present

## 2021-01-05 DIAGNOSIS — E119 Type 2 diabetes mellitus without complications: Secondary | ICD-10-CM | POA: Diagnosis not present

## 2021-01-05 DIAGNOSIS — H2511 Age-related nuclear cataract, right eye: Secondary | ICD-10-CM | POA: Diagnosis not present

## 2021-01-05 DIAGNOSIS — H35372 Puckering of macula, left eye: Secondary | ICD-10-CM | POA: Diagnosis not present

## 2021-01-05 DIAGNOSIS — H52203 Unspecified astigmatism, bilateral: Secondary | ICD-10-CM | POA: Diagnosis not present

## 2021-04-20 DIAGNOSIS — L821 Other seborrheic keratosis: Secondary | ICD-10-CM | POA: Diagnosis not present

## 2021-04-20 DIAGNOSIS — R239 Unspecified skin changes: Secondary | ICD-10-CM | POA: Diagnosis not present

## 2021-05-05 ENCOUNTER — Encounter: Payer: Self-pay | Admitting: Critical Care Medicine

## 2021-05-05 ENCOUNTER — Other Ambulatory Visit: Payer: Self-pay

## 2021-05-05 ENCOUNTER — Ambulatory Visit: Payer: Medicare Other | Attending: Critical Care Medicine | Admitting: Critical Care Medicine

## 2021-05-05 VITALS — BP 135/80 | HR 84 | Ht 59.0 in | Wt 173.4 lb

## 2021-05-05 DIAGNOSIS — G8929 Other chronic pain: Secondary | ICD-10-CM | POA: Diagnosis not present

## 2021-05-05 DIAGNOSIS — N1831 Chronic kidney disease, stage 3a: Secondary | ICD-10-CM | POA: Insufficient documentation

## 2021-05-05 DIAGNOSIS — R059 Cough, unspecified: Secondary | ICD-10-CM | POA: Diagnosis not present

## 2021-05-05 DIAGNOSIS — I739 Peripheral vascular disease, unspecified: Secondary | ICD-10-CM

## 2021-05-05 DIAGNOSIS — E785 Hyperlipidemia, unspecified: Secondary | ICD-10-CM

## 2021-05-05 DIAGNOSIS — M25511 Pain in right shoulder: Secondary | ICD-10-CM

## 2021-05-05 DIAGNOSIS — E119 Type 2 diabetes mellitus without complications: Secondary | ICD-10-CM

## 2021-05-05 DIAGNOSIS — E1142 Type 2 diabetes mellitus with diabetic polyneuropathy: Secondary | ICD-10-CM | POA: Diagnosis not present

## 2021-05-05 DIAGNOSIS — Z23 Encounter for immunization: Secondary | ICD-10-CM

## 2021-05-05 DIAGNOSIS — F419 Anxiety disorder, unspecified: Secondary | ICD-10-CM | POA: Insufficient documentation

## 2021-05-05 DIAGNOSIS — Z6835 Body mass index (BMI) 35.0-35.9, adult: Secondary | ICD-10-CM

## 2021-05-05 DIAGNOSIS — I10 Essential (primary) hypertension: Secondary | ICD-10-CM

## 2021-05-05 DIAGNOSIS — M7501 Adhesive capsulitis of right shoulder: Secondary | ICD-10-CM

## 2021-05-05 DIAGNOSIS — E1165 Type 2 diabetes mellitus with hyperglycemia: Secondary | ICD-10-CM | POA: Insufficient documentation

## 2021-05-05 DIAGNOSIS — K5730 Diverticulosis of large intestine without perforation or abscess without bleeding: Secondary | ICD-10-CM

## 2021-05-05 DIAGNOSIS — F411 Generalized anxiety disorder: Secondary | ICD-10-CM | POA: Insufficient documentation

## 2021-05-05 DIAGNOSIS — R413 Other amnesia: Secondary | ICD-10-CM | POA: Insufficient documentation

## 2021-05-05 HISTORY — DX: Type 2 diabetes mellitus with diabetic polyneuropathy: E11.42

## 2021-05-05 HISTORY — DX: Morbid (severe) obesity due to excess calories: E66.01

## 2021-05-05 HISTORY — DX: Peripheral vascular disease, unspecified: I73.9

## 2021-05-05 HISTORY — DX: Chronic kidney disease, stage 3a: N18.31

## 2021-05-05 HISTORY — DX: Adhesive capsulitis of right shoulder: M75.01

## 2021-05-05 HISTORY — DX: Generalized anxiety disorder: F41.1

## 2021-05-05 LAB — POCT GLYCOSYLATED HEMOGLOBIN (HGB A1C): HbA1c, POC (controlled diabetic range): 6.6 % (ref 0.0–7.0)

## 2021-05-05 LAB — GLUCOSE, POCT (MANUAL RESULT ENTRY): POC Glucose: 134 mg/dl — AB (ref 70–99)

## 2021-05-05 MED ORDER — BENAZEPRIL-HYDROCHLOROTHIAZIDE 20-12.5 MG PO TABS: 1.0000 | ORAL_TABLET | Freq: Every day | ORAL | 2 refills | Status: DC

## 2021-05-05 MED ORDER — PIOGLITAZONE HCL 30 MG PO TABS: ORAL_TABLET | ORAL | 3 refills | Status: DC

## 2021-05-05 MED ORDER — FREESTYLE LANCETS MISC: 5 refills | Status: AC

## 2021-05-05 MED ORDER — METFORMIN HCL 1000 MG PO TABS: 1000.0000 mg | ORAL_TABLET | Freq: Two times a day (BID) | ORAL | 2 refills | Status: DC

## 2021-05-05 MED ORDER — ALBUTEROL SULFATE HFA 108 (90 BASE) MCG/ACT IN AERS: 2.0000 | INHALATION_SPRAY | Freq: Four times a day (QID) | RESPIRATORY_TRACT | 0 refills | Status: AC | PRN

## 2021-05-05 MED ORDER — GABAPENTIN 100 MG PO CAPS: 100.0000 mg | ORAL_CAPSULE | Freq: Three times a day (TID) | ORAL | 2 refills | Status: DC

## 2021-05-05 MED ORDER — ATORVASTATIN CALCIUM 40 MG PO TABS: 40.0000 mg | ORAL_TABLET | Freq: Every day | ORAL | 2 refills | Status: DC

## 2021-05-05 MED ORDER — FREESTYLE LITE TEST VI STRP: ORAL_STRIP | 3 refills | Status: DC

## 2021-05-05 NOTE — Assessment & Plan Note (Signed)
Need to assess recent labs from [facility]

## 2021-05-05 NOTE — Assessment & Plan Note (Signed)
Blood pressure well controlled we will continue current medications refills given  We will hold off on checking labs we will get records from [facility]

## 2021-05-05 NOTE — Assessment & Plan Note (Signed)
Plan referral to neurology

## 2021-05-05 NOTE — Assessment & Plan Note (Signed)
Patient controlled with gabapentin continue same

## 2021-05-05 NOTE — Assessment & Plan Note (Signed)
Need to get records from [facility]

## 2021-05-05 NOTE — Assessment & Plan Note (Signed)
Diabetes under good control A1c good at 6.6 continue current medications

## 2021-05-05 NOTE — Patient Instructions (Signed)
Referral to neurology was made  Refills on medications sent to your pharmacy  REcords from [facility] will be obtained  Return Dr Joya Gaskins 4 months

## 2021-05-05 NOTE — Assessment & Plan Note (Signed)
We discussed a healthy diet at this visit

## 2021-05-05 NOTE — Progress Notes (Addendum)
New Patient Office Visit  Subjective:  Patient ID: Briana Patton, female    DOB: Nov 09, 1940  Age: 81 y.o. MRN: [id number]  CC:  Chief Complaint  Patient presents with   New Patient (Initial Visit)    HPI Briana Patton presents for primary care to establish. This is 81 year old female formally followed [facility] however have not received records from that clinic so I do not have prior information to go on as far as her primary care gaps.  According to our system she will need a tetanus vaccine and we did give this to her at this visit.  She needs a hemoglobin A1c with her diabetes and we did obtain it and it was 6.6 with a blood sugar 134.  She does have longstanding diabetes. Patient does need a foot exam.  It is unclear whether she has ever had a bone density scan or hepatitis C study.  She has had 3 COVID vaccines.  Patient takes oral medications only Actos and metformin.  Patient also does have hypertension.  Patient maintains Lotensin HCT 20/12-1/2 daily.  And on arrival blood pressure was 135/80  Patient maintains atorvastatin 40 mg daily gabapentin 100 mg 3 times daily she is using metformin 1000 mg twice daily and Actos one half 30 mg tablet daily she takes this at dinnertime.  Patient has had increased memory loss recently she is forgetful about dates and where she places things and sometimes her medications.  Her family is concerned about her memory loss as well.    Past Medical History:  Diagnosis Date   Abdominal pain    in past   Barrett esophagus 12/12/2000   egd bx   Diabetes mellitus    Diverticulosis    Diverticulosis of colon (without mention of hemorrhage)    GERD (gastroesophageal reflux disease)    Hyperlipidemia    Hypertension    Internal hemorrhoids without mention of complication    Osteoarthritis     Past Surgical History:  Procedure Laterality Date   ABDOMINAL HYSTERECTOMY     CESAREAN SECTION     3 times   COLONOSCOPY     KNEE  ARTHROSCOPY     left knee surgery 1999   LUMBAR LAMINECTOMY     UPPER GASTROINTESTINAL ENDOSCOPY      Family History  Problem Relation Age of Onset   Diabetes Brother    Diabetes Mother    Diabetes Father    Stroke Father    Alcohol abuse Sister        X2   Epilepsy Brother    Colon cancer Neg Hx    Esophageal cancer Neg Hx    Rectal cancer Neg Hx    Stomach cancer Neg Hx     Social History   Socioeconomic History   Marital status: Married    Spouse name: Not on file   Number of children: Not on file   Years of education: Not on file   Highest education level: Not on file  Occupational History   Not on file  Tobacco Use   Smoking status: Former    Types: Cigarettes    Quit date: 03/08/1998    Years since quitting: 23.1   Smokeless tobacco: Never  Substance and Sexual Activity   Alcohol use: No    Alcohol/week: 0.0 standard drinks   Drug use: No   Sexual activity: Not on file  Other Topics Concern   Not on file  Social History Narrative   Father age  74 complications of cardiac and cerebrovascular disease         Social Determinants of Health   Financial Resource Strain: Not on file  Food Insecurity: Not on file  Transportation Needs: Not on file  Physical Activity: Not on file  Stress: Not on file  Social Connections: Not on file  Intimate Partner Violence: Not on file    ROS Review of Systems  Constitutional:  Negative for chills, diaphoresis and fever.  HENT:  Negative for congestion, hearing loss, nosebleeds, sore throat and tinnitus.   Eyes:  Negative for photophobia and redness.  Respiratory:  Negative for cough, shortness of breath, wheezing and stridor.   Cardiovascular:  Negative for chest pain, palpitations and leg swelling.  Gastrointestinal:  Negative for abdominal pain, blood in stool, constipation, diarrhea, nausea and vomiting.  Endocrine: Negative for polydipsia.  Genitourinary:  Negative for dysuria, flank pain, frequency, hematuria  and urgency.  Musculoskeletal:  Negative for back pain, myalgias and neck pain.  Skin:  Negative for rash.  Allergic/Immunologic: Negative for environmental allergies.  Neurological:  Negative for dizziness, tremors, seizures, weakness and headaches.       Memory loss  Hematological:  Does not bruise/bleed easily.  Psychiatric/Behavioral:  Negative for suicidal ideas. The patient is not nervous/anxious.    Objective:   Today's Vitals: BP 135/80    Pulse 84    Ht 4\' 11"  (1.499 m)    Wt 173 lb 6.4 oz (78.7 kg)    SpO2 92%    BMI 35.02 kg/m  Foot exam Normal Physical Exam Vitals reviewed.  Constitutional:      Appearance: Normal appearance. She is well-developed. She is obese. She is not diaphoretic.  HENT:     Head: Normocephalic and atraumatic.     Nose: No nasal deformity, septal deviation, mucosal edema or rhinorrhea.     Right Sinus: No maxillary sinus tenderness or frontal sinus tenderness.     Left Sinus: No maxillary sinus tenderness or frontal sinus tenderness.     Mouth/Throat:     Pharynx: No oropharyngeal exudate.  Eyes:     General: No scleral icterus.    Conjunctiva/sclera: Conjunctivae normal.     Pupils: Pupils are equal, round, and reactive to light.  Neck:     Thyroid: No thyromegaly.     Vascular: No carotid bruit or JVD.     Trachea: Trachea normal. No tracheal tenderness or tracheal deviation.  Cardiovascular:     Rate and Rhythm: Normal rate and regular rhythm.     Chest Wall: PMI is not displaced.     Pulses: Normal pulses. No decreased pulses.     Heart sounds: Normal heart sounds, S1 normal and S2 normal. Heart sounds not distant. No murmur heard. No systolic murmur is present.  No diastolic murmur is present.    No friction rub. No gallop. No S3 or S4 sounds.  Pulmonary:     Effort: No tachypnea, accessory muscle usage or respiratory distress.     Breath sounds: No stridor. No decreased breath sounds, wheezing, rhonchi or rales.  Chest:     Chest  wall: No tenderness.  Abdominal:     General: Bowel sounds are normal. There is no distension.     Palpations: Abdomen is soft. Abdomen is not rigid.     Tenderness: There is no abdominal tenderness. There is no guarding or rebound.  Musculoskeletal:        General: Normal range of motion.     Cervical  back: Normal range of motion and neck supple. No edema, erythema or rigidity. No muscular tenderness. Normal range of motion.  Lymphadenopathy:     Head:     Right side of head: No submental or submandibular adenopathy.     Left side of head: No submental or submandibular adenopathy.     Cervical: No cervical adenopathy.  Skin:    General: Skin is warm and dry.     Coloration: Skin is not pale.     Findings: No rash.     Nails: There is no clubbing.  Neurological:     General: No focal deficit present.     Mental Status: She is alert and oriented to person, place, and time.     Cranial Nerves: No cranial nerve deficit.     Sensory: No sensory deficit.     Motor: No weakness.     Coordination: Coordination normal.     Gait: Gait normal.     Deep Tendon Reflexes: Reflexes normal.  Psychiatric:        Mood and Affect: Mood normal.        Speech: Speech normal.        Behavior: Behavior normal.        Thought Content: Thought content normal.    Assessment & Plan:   Problem List Items Addressed This Visit       Cardiovascular and Mediastinum   Essential hypertension    Blood pressure well controlled we will continue current medications refills given  We will hold off on checking labs we will get records from [facility]      Relevant Medications   atorvastatin (LIPITOR) 40 MG tablet   benazepril-hydrochlorthiazide (LOTENSIN HCT) 20-12.5 MG tablet   PVD (peripheral vascular disease) (HCC)    I do not have any records or evaluations on this or awaiting records from [facility]      Relevant Medications   atorvastatin (LIPITOR) 40 MG tablet   benazepril-hydrochlorthiazide  (LOTENSIN HCT) 20-12.5 MG tablet     Digestive   Diverticulosis of colon    Need to get records from [facility]        Endocrine   Diabetes mellitus without complication (HCC)    Diabetes under good control A1c good at 6.6 continue current medications      Relevant Medications   atorvastatin (LIPITOR) 40 MG tablet   benazepril-hydrochlorthiazide (LOTENSIN HCT) 20-12.5 MG tablet   metFORMIN (GLUCOPHAGE) 1000 MG tablet   pioglitazone (ACTOS) 30 MG tablet   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    Patient controlled with gabapentin continue same      Relevant Medications   atorvastatin (LIPITOR) 40 MG tablet   benazepril-hydrochlorthiazide (LOTENSIN HCT) 20-12.5 MG tablet   gabapentin (NEURONTIN) 100 MG capsule   metFORMIN (GLUCOPHAGE) 1000 MG tablet   pioglitazone (ACTOS) 30 MG tablet   Other Relevant Orders   HgB A1c (Completed)   POCT glucose (manual entry) (Completed)     Musculoskeletal and Integument   Adhesive capsulitis of right shoulder   Relevant Medications   benazepril-hydrochlorthiazide (LOTENSIN HCT) 20-12.5 MG tablet     Genitourinary   Chronic kidney disease, stage 3a (HCC)    Need to assess recent labs from [place]        Other   Dyslipidemia    Continue atorvastatin we will check and see if labs been obtained recently from [facility]      Relevant Medications   atorvastatin (LIPITOR) 40 MG tablet  Morbid obesity (HCC)    We discussed a healthy diet at this visit      Relevant Medications   metFORMIN (GLUCOPHAGE) 1000 MG tablet   pioglitazone (ACTOS) 30 MG tablet   Memory loss    Plan referral to neurology      Relevant Orders   Ambulatory referral to Neurology   RESOLVED: Chronic right shoulder pain   Relevant Medications   benazepril-hydrochlorthiazide (LOTENSIN HCT) 20-12.5 MG tablet   gabapentin (NEURONTIN) 100 MG capsule   Other Visit Diagnoses     Cough       Relevant Medications   albuterol (VENTOLIN HFA) 108  (90 Base) MCG/ACT inhaler       Outpatient Encounter Medications as of 05/05/2021  Medication Sig   Blood Glucose Monitoring Suppl (FREESTYLE FREEDOM LITE) W/DEVICE KIT As directed , dx code 250.00   Multiple Vitamins-Calcium (ONE-A-DAY WOMENS FORMULA PO) Take 1 tablet by mouth daily.   [DISCONTINUED] atorvastatin (LIPITOR) 40 MG tablet TAKE 1 TABLET BY MOUTH ONCE DAILY   [DISCONTINUED] atorvastatin (LIPITOR) 40 MG tablet Take 1 tablet by mouth daily.   [DISCONTINUED] benazepril-hydrochlorthiazide (LOTENSIN HCT) 20-12.5 MG tablet TAKE 1 TABLET BY MOUTH ONCE DAILY   [DISCONTINUED] gabapentin (NEURONTIN) 100 MG capsule Take 100 mg by mouth 3 (three) times daily.   [DISCONTINUED] glucose blood (FREESTYLE LITE) test strip Test once daily.   [DISCONTINUED] glucose blood test strip Use to check blood sugars   [DISCONTINUED] Lancets (FREESTYLE) lancets Test once daily.   [DISCONTINUED] metFORMIN (GLUCOPHAGE) 1000 MG tablet Take 1 tablet (1,000 mg total) by mouth 2 (two) times daily with a meal.   [DISCONTINUED] pioglitazone (ACTOS) 30 MG tablet 1/2 tablet   albuterol (VENTOLIN HFA) 108 (90 Base) MCG/ACT inhaler Inhale 2 puffs into the lungs every 6 (six) hours as needed for wheezing or shortness of breath.   atorvastatin (LIPITOR) 40 MG tablet Take 1 tablet (40 mg total) by mouth daily.   benazepril-hydrochlorthiazide (LOTENSIN HCT) 20-12.5 MG tablet Take 1 tablet by mouth daily.   gabapentin (NEURONTIN) 100 MG capsule Take 1 capsule (100 mg total) by mouth 3 (three) times daily.   glucose blood (FREESTYLE LITE) test strip Test once daily.   Lancets (FREESTYLE) lancets Test once daily.   metFORMIN (GLUCOPHAGE) 1000 MG tablet Take 1 tablet (1,000 mg total) by mouth 2 (two) times daily with a meal.   pioglitazone (ACTOS) 30 MG tablet 1/2 tablet   [DISCONTINUED] albuterol (PROVENTIL HFA;VENTOLIN HFA) 108 (90 BASE) MCG/ACT inhaler Inhale 2 puffs into the lungs every 6 (six) hours as needed for wheezing  or shortness of breath. (Patient not taking: Reported on 05/05/2021)   [DISCONTINUED] aspirin 81 MG tablet Take 1 tablet (81 mg total) by mouth daily.   [DISCONTINUED] Cholecalciferol (VITAMIN D3) 2000 units TABS Take 1 capsule by mouth daily.   [DISCONTINUED] fluticasone (FLONASE) 50 MCG/ACT nasal spray Place 2 sprays into both nostrils daily.   [DISCONTINUED] ketorolac (ACULAR) 0.5 % ophthalmic solution Place 1 drop into the left eye 4 (four) times daily.    [DISCONTINUED] metFORMIN (GLUCOPHAGE) 1000 MG tablet 1 tablet with a meal   [DISCONTINUED] naproxen (NAPROSYN) 500 MG tablet SMARTSIG:1 Tablet(s) By Mouth Every 12 Hours PRN   [DISCONTINUED] pantoprazole (PROTONIX) 40 MG tablet Take 1 tablet (40 mg total) by mouth daily. (Patient not taking: Reported on 05/05/2021)   [DISCONTINUED] pioglitazone (ACTOS) 45 MG tablet Take 1 tablet (45 mg total) by mouth daily. (Patient not taking: Reported on 10/20/2020)   [DISCONTINUED] Polyethyl Glycol-Propyl  Glycol (SYSTANE OP) Apply to eye as needed. As directed for dry eyes   [DISCONTINUED] prednisoLONE acetate (PRED FORTE) 1 % ophthalmic suspension Place 1 drop into the left eye 2 (two) times daily.    [DISCONTINUED] Wheat Dextrin (BENEFIBER PO) Take 3 capsules by mouth as needed.    No facility-administered encounter medications on file as of 05/05/2021.  45 minutes spent reviewing records obtaining history and physical complex decision making multiple medications renewed extra time spent with medication reconciliation's  Follow-up: Return in about 4 months (around 09/02/2021).   Shan Levans, MD

## 2021-05-05 NOTE — Assessment & Plan Note (Signed)
I do not have any records or evaluations on this or awaiting records from [facility]

## 2021-05-05 NOTE — Assessment & Plan Note (Signed)
Continue atorvastatin we will check and see if labs been obtained recently from [facility]

## 2021-05-06 ENCOUNTER — Other Ambulatory Visit: Payer: Self-pay | Admitting: Critical Care Medicine

## 2021-05-06 DIAGNOSIS — Z1231 Encounter for screening mammogram for malignant neoplasm of breast: Secondary | ICD-10-CM

## 2021-05-12 ENCOUNTER — Other Ambulatory Visit: Payer: Self-pay

## 2021-05-12 ENCOUNTER — Ambulatory Visit: Payer: Medicare Other | Admitting: Physician Assistant

## 2021-05-12 ENCOUNTER — Other Ambulatory Visit (INDEPENDENT_AMBULATORY_CARE_PROVIDER_SITE_OTHER): Payer: Medicare Other

## 2021-05-12 ENCOUNTER — Encounter: Payer: Self-pay | Admitting: Physician Assistant

## 2021-05-12 VITALS — BP 161/80 | HR 65 | Resp 18 | Ht 59.0 in | Wt 175.0 lb

## 2021-05-12 DIAGNOSIS — F0150 Vascular dementia without behavioral disturbance: Secondary | ICD-10-CM | POA: Diagnosis not present

## 2021-05-12 DIAGNOSIS — R413 Other amnesia: Secondary | ICD-10-CM

## 2021-05-12 HISTORY — DX: Vascular dementia, unspecified severity, without behavioral disturbance, psychotic disturbance, mood disturbance, and anxiety: F01.50

## 2021-05-12 LAB — CBC
HCT: 31.8 % — ABNORMAL LOW (ref 36.0–46.0)
Hemoglobin: 10.4 g/dL — ABNORMAL LOW (ref 12.0–15.0)
MCHC: 32.6 g/dL (ref 30.0–36.0)
MCV: 92.7 fl (ref 78.0–100.0)
Platelets: 189 10*3/uL (ref 150.0–400.0)
RBC: 3.43 Mil/uL — ABNORMAL LOW (ref 3.87–5.11)
RDW: 15.2 % (ref 11.5–15.5)
WBC: 5.4 10*3/uL (ref 4.0–10.5)

## 2021-05-12 LAB — TSH: TSH: 4.3 u[IU]/mL (ref 0.35–5.50)

## 2021-05-12 LAB — FERRITIN: Ferritin: 34.3 ng/mL (ref 10.0–291.0)

## 2021-05-12 LAB — FOLATE: Folate: 8.7 ng/mL (ref >5.9)

## 2021-05-12 LAB — VITAMIN B12: Vitamin B-12: 304 pg/mL (ref 211–911)
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
## 2021-05-12 NOTE — Progress Notes (Signed)
Please inform patient that the only abnormal lab is CBC with Hb 10.4, not very difference since 4 years ago, but needs to be followed up by PCP, thanks

## 2021-05-12 NOTE — Patient Instructions (Addendum)
It was a pleasure to see you today at our office.   Recommendations:  Neurocognitive evaluation at our office MRI of the brain, the radiology office will call you to arrange you appointment Check labs today CBC, B12, Iron studies  Follow up in 3 months  We will start donepezil half tablet ('5mg'$ ) daily for 2  weeks.  If you are tolerating the medication, then after 2 weeks, we will increase the dose to a full tablet of 10 mg daily.  Side effects include nausea, vomiting, diarrhea, vivid dreams, and muscle cramps.  Please call the clinic if you experience  severe symptoms.    RECOMMENDATIONS FOR ALL PATIENTS WITH MEMORY PROBLEMS: 1. Continue to exercise (Recommend 30 minutes of walking everyday, or 3 hours every week) 2. Increase social interactions - continue going to [place] and enjoy social gatherings with friends and family 3. Eat healthy, avoid fried foods and eat more fruits and vegetables 4. Maintain adequate blood pressure, blood sugar, and blood cholesterol level. Reducing the risk of stroke and cardiovascular disease also helps promoting better memory. 5. Avoid stressful situations. Live a simple life and avoid aggravations. Organize your time and prepare for the next day in anticipation. 6. Sleep well, avoid any interruptions of sleep and avoid any distractions in the bedroom that may interfere with adequate sleep quality 7. Avoid sugar, avoid sweets as there is a strong link between excessive sugar intake, diabetes, and cognitive impairment We discussed the Mediterranean diet, which has been shown to help patients reduce the risk of progressive memory disorders and reduces cardiovascular risk. This includes eating fish, eat fruits and green leafy vegetables, nuts like almonds and hazelnuts, walnuts, and also use olive oil. Avoid fast foods and fried foods as much as possible. Avoid sweets and sugar as sugar use has been linked to worsening of memory function.  There is always a concern  of gradual progression of memory problems. If this is the case, then we may need to adjust level of care according to patient needs. Support, both to the patient and caregiver, should then be put into place.      You have been referred for a neuropsychological evaluation (i.e., evaluation of memory and thinking abilities). Please bring someone with you to this appointment if possible, as it is helpful for the doctor to hear from both you and another adult who knows you well. Please bring eyeglasses and hearing aids if you wear them.    The evaluation will take approximately 3 hours and has two parts:   The first part is a clinical interview with the neuropsychologist (Dr. Melvyn Novas or Dr. Nicole Kindred). During the interview, the neuropsychologist will speak with you and the individual you brought to the appointment.    The second part of the evaluation is testing with the doctor's technician Hinton Dyer or Maudie Mercury). During the testing, the technician will ask you to remember different types of material, solve problems, and answer some questionnaires. Your family member will not be present for this portion of the evaluation.   Please note: We must reserve several hours of the neuropsychologist's time and the psychometrician's time for your evaluation appointment. As such, there is a No-Show fee of $100. If you are unable to attend any of your appointments, please contact our office as soon as possible to reschedule.    FALL PRECAUTIONS: Be cautious when walking. Scan the area for obstacles that may increase the risk of trips and falls. When getting up in the mornings, sit up  at the edge of the bed for a few minutes before getting out of bed. Consider elevating the bed at the head end to avoid drop of blood pressure when getting up. Walk always in a well-lit room (use night lights in the walls). Avoid area rugs or power cords from appliances in the middle of the walkways. Use a walker or a cane if necessary and consider  physical therapy for balance exercise. Get your eyesight checked regularly.  FINANCIAL OVERSIGHT: Supervision, especially oversight when making financial decisions or transactions is also recommended.  HOME SAFETY: Consider the safety of the kitchen when operating appliances like stoves, microwave oven, and blender. Consider having supervision and share cooking responsibilities until no longer able to participate in those. Accidents with firearms and other hazards in the house should be identified and addressed as well.   ABILITY TO BE LEFT ALONE: If patient is unable to contact 911 operator, consider using LifeLine, or when the need is there, arrange for someone to stay with patients. Smoking is a fire hazard, consider supervision or cessation. Risk of wandering should be assessed by caregiver and if detected at any point, supervision and safe proof recommendations should be instituted.  MEDICATION SUPERVISION: Inability to self-administer medication needs to be constantly addressed. Implement a mechanism to ensure safe administration of the medications.   DRIVING: Regarding driving, in patients with progressive memory problems, driving will be impaired. We advise to have someone else do the driving if trouble finding directions or if minor accidents are reported. Independent driving assessment is available to determine safety of driving.   If you are interested in the driving assessment, you can contact the following:  [facility] [employer] in [place]  [place] [phone] or [phone]    [place] refers to food and lifestyle choices that are based on the traditions of countries located on the [employer]. This way of eating has been shown to help prevent certain conditions and improve outcomes for people who have chronic diseases, like  kidney disease and heart disease. What are tips for following this plan? Lifestyle  Cook and eat meals together with your family, when possible. Drink enough fluid to keep your urine clear or pale yellow. Be physically active every day. This includes: Aerobic exercise like running or swimming. Leisure activities like gardening, walking, or housework. Get 7-8 hours of sleep each night. If recommended by your health care provider, drink red wine in moderation. This means 1 glass a day for nonpregnant women and 2 glasses a day for men. A glass of wine equals 5 oz (150 mL). Reading food labels  Check the serving size of packaged foods. For foods such as rice and pasta, the serving size refers to the amount of cooked product, not dry. Check the total fat in packaged foods. Avoid foods that have saturated fat or trans fats. Check the ingredients list for added sugars, such as corn syrup. Shopping  At the grocery store, buy most of your food from the areas near the walls of the store. This includes: Fresh fruits and vegetables (produce). Grains, beans, nuts, and seeds. Some of these may be available in unpackaged forms or large amounts (in bulk). Fresh seafood. Poultry and eggs. Low-fat dairy products. Buy whole ingredients instead of prepackaged foods. Buy fresh fruits and vegetables in-season from local farmers markets. Buy frozen fruits and vegetables in resealable bags. If you do not have  access to quality fresh seafood, buy precooked frozen shrimp or canned fish, such as tuna, salmon, or sardines. Buy small amounts of raw or cooked vegetables, salads, or olives from the deli or salad bar at your store. Stock your pantry so you always have certain foods on hand, such as olive oil, canned tuna, canned tomatoes, rice, pasta, and beans. Cooking  Cook foods with extra-virgin olive oil instead of using butter or other vegetable oils. Have meat as a side dish, and have vegetables or grains as  your main dish. This means having meat in small portions or adding small amounts of meat to foods like pasta or stew. Use beans or vegetables instead of meat in common dishes like chili or lasagna. Experiment with different cooking methods. Try roasting or broiling vegetables instead of steaming or sauteing them. Add frozen vegetables to soups, stews, pasta, or rice. Add nuts or seeds for added healthy fat at each meal. You can add these to yogurt, salads, or vegetable dishes. Marinate fish or vegetables using olive oil, lemon juice, garlic, and fresh herbs. Meal planning  Plan to eat 1 vegetarian meal one day each week. Try to work up to 2 vegetarian meals, if possible. Eat seafood 2 or more times a week. Have healthy snacks readily available, such as: Vegetable sticks with hummus. Greek yogurt. Fruit and nut trail mix. Eat balanced meals throughout the week. This includes: Fruit: 2-3 servings a day Vegetables: 4-5 servings a day Low-fat dairy: 2 servings a day Fish, poultry, or lean meat: 1 serving a day Beans and legumes: 2 or more servings a week Nuts and seeds: 1-2 servings a day Whole grains: 6-8 servings a day Extra-virgin olive oil: 3-4 servings a day Limit red meat and sweets to only a few servings a month What are my food choices? Mediterranean diet Recommended Grains: Whole-grain pasta. Brown rice. Bulgar wheat. Polenta. Couscous. Whole-wheat bread. [place]. Vegetables: Artichokes. Beets. Broccoli. Cabbage. Carrots. Eggplant. Green beans. Chard. Kale. Spinach. Onions. Leeks. Peas. Squash. Tomatoes. Peppers. Radishes. Fruits: Apples. Apricots. Avocado. Berries. Bananas. Cherries. Dates. Figs. Grapes. Lemons. Melon. Oranges. Peaches. Plums. Pomegranate. Meats and other protein foods: Beans. Almonds. Sunflower seeds. Pine nuts. Peanuts. [place]. Salmon. Scallops. Shrimp. [place]. Tilapia. Clams. Oysters. Eggs. Dairy: Low-fat milk. Cheese. Greek yogurt. Beverages: Water. Red  wine. Herbal tea. Fats and oils: Extra virgin olive oil. Avocado oil. Grape seed oil. Sweets and desserts: [place] yogurt with honey. Baked apples. Poached pears. Trail mix. Seasoning and other foods: Basil. Cilantro. Coriander. Cumin. Mint. Parsley. Sage. Rosemary. Tarragon. Garlic. Oregano. Thyme. Pepper. Balsalmic vinegar. Tahini. Hummus. Tomato sauce. Olives. Mushrooms. Limit these Grains: Prepackaged pasta or rice dishes. Prepackaged cereal with added sugar. Vegetables: Deep fried potatoes (french fries). Fruits: Fruit canned in syrup. Meats and other protein foods: Beef. Pork. Lamb. Poultry with skin. Hot dogs. Berniece Salines. Dairy: Ice cream. Sour cream. Whole milk. Beverages: Juice. Sugar-sweetened soft drinks. Beer. Liquor and spirits. Fats and oils: Butter. Canola oil. Vegetable oil. Beef fat (tallow). Lard. Sweets and desserts: Cookies. Cakes. Pies. Candy. Seasoning and other foods: Mayonnaise. Premade sauces and marinades. The items listed may not be a complete list. Talk with your dietitian about what dietary choices are right for you. Summary The Mediterranean diet includes both food and lifestyle choices. Eat a variety of fresh fruits and vegetables, beans, nuts, seeds, and whole grains. Limit the amount of red meat and sweets that you eat. Talk with your health care provider about whether it is safe for you to  drink red wine in moderation. This means 1 glass a day for nonpregnant women and 2 glasses a day for men. A glass of wine equals 5 oz (150 mL). This information is not intended to replace advice given to you by your health care provider. Make sure you discuss any questions you have with your health care provider. Document Released: 10/16/2015 Document Revised: 11/18/2015 Document Reviewed: 10/16/2015 Elsevier Interactive Patient Education  2017 [employer].   We have sent a referral to [facility] for your MRI and they will call you directly to schedule your appointment.  They are located at [place]. If you need to contact them directly please call [phone].  Your provider has requested that you have labwork completed today. Please go to [facility] ([id number]) on the second floor of this building before leaving the office today. You do not need to check in. If you are not called within 15 minutes please check with the front desk.

## 2021-05-13 LAB — IRON AND TIBC
Iron Saturation: 20 % (ref 15–55)
Iron: 66 ug/dL (ref 27–139)
Total Iron Binding Capacity: 328 ug/dL (ref 250–450)
UIBC: 262 ug/dL (ref 118–369)

## 2021-05-15 ENCOUNTER — Telehealth: Payer: Self-pay | Admitting: Physician Assistant

## 2021-05-15 ENCOUNTER — Other Ambulatory Visit: Payer: Self-pay | Admitting: Physician Assistant

## 2021-05-15 MED ORDER — DONEPEZIL HCL 10 MG PO TABS: ORAL_TABLET | ORAL | 11 refills | Status: DC

## 2021-05-15 NOTE — Telephone Encounter (Signed)
Pt called an advised that Clarise Cruz PA sent it again. Start  donepezil 10 mg, half tablet for 2 weeks, then increase to 1 tablet 10 mg daily. Pt verbalized understanding,  ?

## 2021-05-15 NOTE — Telephone Encounter (Signed)
1. Which medications need refilled? (List name and dosage, if known) donepezil  ? ?2. Which pharmacy/location is medication to be sent to? (include street and city if Management consultant) Paediatric nurse on [address]  ? ?3. Do they need a 30 day or 90 day supply? Either is fine, per patient. ? ?Patient was seen recently but said her medication was not sent in for her yet. ? ?

## 2021-05-18 ENCOUNTER — Encounter: Payer: Self-pay | Admitting: Critical Care Medicine

## 2021-05-18 DIAGNOSIS — L219 Seborrheic dermatitis, unspecified: Secondary | ICD-10-CM | POA: Insufficient documentation

## 2021-05-18 HISTORY — DX: Seborrheic dermatitis, unspecified: L21.9

## 2021-05-26 ENCOUNTER — Telehealth: Payer: Self-pay

## 2021-06-10 NOTE — Telephone Encounter (Signed)
Close encounter 

## 2021-07-02 ENCOUNTER — Ambulatory Visit: Payer: Medicare Other

## 2021-07-22 ENCOUNTER — Ambulatory Visit: Payer: Medicare Other | Admitting: Physician Assistant

## 2021-08-12 ENCOUNTER — Encounter: Payer: Self-pay | Admitting: Physician Assistant

## 2021-08-12 ENCOUNTER — Ambulatory Visit: Payer: Medicare Other | Admitting: Physician Assistant

## 2021-08-12 DIAGNOSIS — Z029 Encounter for administrative examinations, unspecified: Secondary | ICD-10-CM
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
## 2021-08-24 ENCOUNTER — Encounter: Payer: Self-pay | Admitting: Internal Medicine

## 2021-08-25 ENCOUNTER — Other Ambulatory Visit: Payer: Self-pay

## 2021-08-25 ENCOUNTER — Other Ambulatory Visit: Payer: Self-pay | Admitting: Critical Care Medicine

## 2021-08-25 ENCOUNTER — Other Ambulatory Visit: Payer: Self-pay | Admitting: Pharmacist

## 2021-08-25 DIAGNOSIS — M7501 Adhesive capsulitis of right shoulder: Secondary | ICD-10-CM

## 2021-08-25 DIAGNOSIS — G8929 Other chronic pain: Secondary | ICD-10-CM

## 2021-08-25 MED ORDER — BENAZEPRIL-HYDROCHLOROTHIAZIDE 20-12.5 MG PO TABS: 1.0000 | ORAL_TABLET | Freq: Every day | ORAL | 2 refills | Status: DC

## 2021-08-25 MED ORDER — PIOGLITAZONE HCL 30 MG PO TABS
15.0000 mg | ORAL_TABLET | Freq: Every day | ORAL | 3 refills | Status: DC
Start: 2021-08-25 — End: 2021-09-15

## 2021-08-25 MED ORDER — METFORMIN HCL 1000 MG PO TABS: 1000.0000 mg | ORAL_TABLET | Freq: Two times a day (BID) | ORAL | 2 refills | Status: DC

## 2021-08-25 NOTE — Telephone Encounter (Signed)
Pt called and stated that she is very upset because she states that she has called the office several time with no response to a medication refill. She states that no one has tried to call and she reallt needs her Diabetic medication.   Medication: pioglitazone (ACTOS) 30 MG tablet   Pharmacy:  [facility], [place] Phone:  [phone]  Fax:  [place].   I do not see any messages related to a medication refill. Patient is requesting a call from the office ASAP. Very upset.

## 2021-08-25 NOTE — Telephone Encounter (Signed)
Attempted to reach pt , second attempt. RX was sent to [facility]. Need to clarify preferred pharmacy.

## 2021-08-25 NOTE — Telephone Encounter (Signed)
Attempted to reach pt, left VM to call back. Please clarify which pharmacy pt wants refill sent. Pt has refills available at [facility] where originally filled.

## 2021-08-26 ENCOUNTER — Telehealth: Payer: Self-pay

## 2021-08-26 NOTE — Telephone Encounter (Signed)
Got a message that she was need medication refill, was calling to see what specific medication

## 2021-09-15 ENCOUNTER — Encounter: Payer: Self-pay | Admitting: Critical Care Medicine

## 2021-09-15 ENCOUNTER — Ambulatory Visit: Payer: Medicare Other | Attending: Critical Care Medicine | Admitting: Critical Care Medicine

## 2021-09-15 VITALS — BP 109/67 | HR 60 | Wt 169.2 lb

## 2021-09-15 DIAGNOSIS — M25562 Pain in left knee: Secondary | ICD-10-CM | POA: Diagnosis not present

## 2021-09-15 DIAGNOSIS — G8929 Other chronic pain: Secondary | ICD-10-CM

## 2021-09-15 DIAGNOSIS — E119 Type 2 diabetes mellitus without complications: Secondary | ICD-10-CM

## 2021-09-15 DIAGNOSIS — M25511 Pain in right shoulder: Secondary | ICD-10-CM | POA: Diagnosis not present

## 2021-09-15 DIAGNOSIS — E785 Hyperlipidemia, unspecified: Secondary | ICD-10-CM | POA: Diagnosis not present

## 2021-09-15 DIAGNOSIS — I10 Essential (primary) hypertension: Secondary | ICD-10-CM

## 2021-09-15 DIAGNOSIS — F0150 Vascular dementia without behavioral disturbance: Secondary | ICD-10-CM

## 2021-09-15 DIAGNOSIS — M7501 Adhesive capsulitis of right shoulder: Secondary | ICD-10-CM | POA: Diagnosis not present

## 2021-09-15 LAB — POCT GLYCOSYLATED HEMOGLOBIN (HGB A1C): HbA1c, POC (controlled diabetic range): 6.2 % (ref 0.0–7.0)

## 2021-09-15 LAB — GLUCOSE, POCT (MANUAL RESULT ENTRY): POC Glucose: 134 mg/dl — AB (ref 70–99)

## 2021-09-15 MED ORDER — PIOGLITAZONE HCL 30 MG PO TABS: 15.0000 mg | ORAL_TABLET | Freq: Every day | ORAL | 3 refills | Status: DC

## 2021-09-15 MED ORDER — METFORMIN HCL 1000 MG PO TABS: 1000.0000 mg | ORAL_TABLET | Freq: Two times a day (BID) | ORAL | 2 refills | Status: DC

## 2021-09-15 MED ORDER — GABAPENTIN 100 MG PO CAPS: 100.0000 mg | ORAL_CAPSULE | Freq: Three times a day (TID) | ORAL | 2 refills | Status: DC

## 2021-09-15 MED ORDER — BENAZEPRIL-HYDROCHLOROTHIAZIDE 20-12.5 MG PO TABS
1.0000 | ORAL_TABLET | Freq: Every day | ORAL | 2 refills | Status: DC
Start: 2021-09-15 — End: 2022-02-16

## 2021-09-15 MED ORDER — ATORVASTATIN CALCIUM 40 MG PO TABS: 40.0000 mg | ORAL_TABLET | Freq: Every day | ORAL | 2 refills | Status: DC

## 2021-09-15 MED ORDER — DONEPEZIL HCL 10 MG PO TABS: ORAL_TABLET | ORAL | 11 refills | Status: DC

## 2021-09-15 NOTE — Assessment & Plan Note (Signed)
Reassess lipid panel 

## 2021-09-15 NOTE — Assessment & Plan Note (Signed)
Suspect chronic arthritis tricompartment left knee will refer to orthopedics

## 2021-09-15 NOTE — Assessment & Plan Note (Signed)
As per neurology has upcoming appointments continue Aricept at increased dose 10 mg daily

## 2021-09-15 NOTE — Assessment & Plan Note (Signed)
A1c at goal 6.2 plan to continue same with metformin and Actos

## 2021-09-15 NOTE — Progress Notes (Signed)
Let pt know her A1C today was normal

## 2021-09-15 NOTE — Progress Notes (Signed)
New Patient Office Visit  Subjective:  Patient ID: Briana Patton, female    DOB: 1940/09/09  Age: 81 y.o. MRN: [id number]  CC:  Chief Complaint  Patient presents with   Knee Pain   Memory Loss    HPI 04/2021 Briana Patton presents for primary care to establish. This is 81 year old female formally followed [facility] however have not received records from that clinic so I do not have prior information to go on as far as her primary care gaps.  According to our system she will need a tetanus vaccine and we did give this to her at this visit.  She needs a hemoglobin A1c with her diabetes and we did obtain it and it was 6.6 with a blood sugar 134.  She does have longstanding diabetes. Patient does need a foot exam.  It is unclear whether she has ever had a bone density scan or hepatitis C study.  She has had 3 COVID vaccines.  Patient takes oral medications only Actos and metformin.  Patient also does have hypertension.  Patient maintains Lotensin HCT 20/12-1/2 daily.  And on arrival blood pressure was 135/80  Patient maintains atorvastatin 40 mg daily gabapentin 100 mg 3 times daily she is using metformin 1000 mg twice daily and Actos one half 30 mg tablet daily she takes this at dinnertime.  Patient has had increased memory loss recently she is forgetful about dates and where she places things and sometimes her medications.  Her family is concerned about her memory loss as well.  7/11   This patient is seen in return follow-up and saw neurology in March they felt she does likely have early Alzheimer's she has mild cognitive impairment on the [facility] scale she was started on Aricept the patient has failed increased dosage as of yet she still in the one half 10 mg tablet.  The patient on arrival has good blood pressure control 109/67 she maintains Lotensin HCT at current dose level.  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  Patient denies feeling unsafe at home Discussed the importance of regular daily schedule with inclusion of crossword puzzles to maintain brain function.  Continue to monitor mood with PCP.  Stay active at least 30 minutes at least 3 times a week.  Naps should be scheduled and should be no longer than 60 minutes and should not occur after 2 PM.  Control cardiovascular risk factors, blood pressure today is elevated at 161/80, she has been informed of the values. Mediterranean diet is recommended  Start donepezil 10 mg, half tablet for 2 weeks, then increase to 1 tablet 10 mg daily, side effects discussed Folllow up once results above are available   Today the patient is accompanied by her daughter [name] who is a former employee at [employer] and she would like  her mother transferred to another primary care site she has an appointment with Dr. 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   egd bx   Diabetes mellitus    Diverticulosis    Diverticulosis of colon (without mention of hemorrhage)    GERD (gastroesophageal reflux disease)    Hyperlipidemia    Hypertension    Internal hemorrhoids without  mention of complication    Osteoarthritis     Past Surgical History:  Procedure Laterality Date   ABDOMINAL HYSTERECTOMY     CESAREAN SECTION     3 times   COLONOSCOPY     KNEE ARTHROSCOPY     left knee surgery 1999   LUMBAR LAMINECTOMY     UPPER GASTROINTESTINAL ENDOSCOPY      Family History  Problem Relation Age of Onset   Diabetes Brother    Diabetes Mother    Diabetes Father    Stroke Father    Alcohol abuse Sister        X2   Epilepsy Brother    Colon cancer Neg Hx    Esophageal cancer Neg Hx    Rectal cancer Neg Hx    Stomach cancer Neg Hx     Social History   Socioeconomic History   Marital status: Married    Spouse name: Not on file   Number of children: 3   Years of education: 12   Highest education level: Not on file  Occupational History   Not on file  Tobacco Use   Smoking status: Former    Types: Cigarettes    Quit date: 03/08/1998    Years since quitting: 23.5   Smokeless tobacco: Never  Substance and Sexual Activity   Alcohol use: No    Alcohol/week: 0.0 standard drinks of alcohol   Drug use: No   Sexual activity: Not on file  Other Topics Concern   Not on file  Social History Narrative   Father age 75 complications of cardiac and cerebrovascular disease   Right handed   One story home   No caffeine   Social Determinants of Health   Financial Resource Strain: Not on file  Food Insecurity: Not on file  Transportation Needs: Not on file  Physical Activity: Not on file  Stress: Not on file  Social Connections: Not on file  Intimate Partner Violence: Not on file    ROS Review of Systems  Constitutional:  Negative for chills, diaphoresis and fever.  HENT:  Negative for congestion, hearing loss, nosebleeds, sore throat and tinnitus.   Eyes:  Negative for photophobia and redness.  Respiratory:  Negative for cough, shortness of breath, wheezing and stridor.   Cardiovascular:  Negative for chest pain, palpitations and leg swelling.   Gastrointestinal:  Negative for abdominal pain, blood in stool, constipation, diarrhea, nausea and vomiting.  Endocrine: Negative for polydipsia.  Genitourinary:  Negative for dysuria, flank pain, frequency, hematuria and urgency.  Musculoskeletal:  Negative for back pain, myalgias and neck pain.  Skin:  Negative for rash.  Allergic/Immunologic: Negative for environmental allergies.  Neurological:  Negative for dizziness, tremors, seizures, weakness and headaches.       Memory loss  Hematological:  Does not bruise/bleed easily.  Psychiatric/Behavioral:  Negative for suicidal ideas. The patient is not nervous/anxious.     Objective:   Today's Vitals: BP 109/67  Pulse 60   Wt 169 lb 3.2 oz (76.7 kg)   SpO2 97%   BMI 34.17 kg/m  Foot exam Normal Physical Exam Vitals reviewed.  Constitutional:      Appearance: Normal appearance. She is well-developed. She is obese. She is not diaphoretic.  HENT:     Head: Normocephalic and atraumatic.     Nose: No nasal deformity, septal deviation, mucosal edema or rhinorrhea.     Right Sinus: No maxillary sinus tenderness or frontal sinus tenderness.     Left Sinus: No maxillary sinus tenderness or frontal sinus tenderness.     Mouth/Throat:     Pharynx: No oropharyngeal exudate.  Eyes:     General: No scleral icterus.    Conjunctiva/sclera: Conjunctivae normal.     Pupils: Pupils are equal, round, and reactive to light.  Neck:     Thyroid: No thyromegaly.     Vascular: No carotid bruit or JVD.     Trachea: Trachea normal. No tracheal tenderness or tracheal deviation.  Cardiovascular:     Rate and Rhythm: Normal rate and regular rhythm.     Chest Wall: PMI is not displaced.     Pulses: Normal pulses. No decreased pulses.     Heart sounds: Normal heart sounds, S1 normal and S2 normal. Heart sounds not distant. No murmur heard.    No systolic murmur is present.     No diastolic murmur is present.     No friction rub. No gallop. No S3 or  S4 sounds.  Pulmonary:     Effort: No tachypnea, accessory muscle usage or respiratory distress.     Breath sounds: No stridor. No decreased breath sounds, wheezing, rhonchi or rales.  Chest:     Chest wall: No tenderness.  Abdominal:     General: Bowel sounds are normal. There is no distension.     Palpations: Abdomen is soft. Abdomen is not rigid.     Tenderness: There is no abdominal tenderness. There is no guarding or rebound.  Musculoskeletal:        General: Normal range of motion.     Cervical back: Normal range of motion and neck supple. No edema, erythema or rigidity. No muscular tenderness. Normal range of motion.  Lymphadenopathy:     Head:     Right side of head: No submental or submandibular adenopathy.     Left side of head: No submental or submandibular adenopathy.     Cervical: No cervical adenopathy.  Skin:    General: Skin is warm and dry.     Coloration: Skin is not pale.     Findings: No rash.     Nails: There is no clubbing.  Neurological:     General: No focal deficit present.     Mental Status: She is alert and oriented to person, place, and time.     Cranial Nerves: No cranial nerve deficit.     Sensory: No sensory deficit.     Motor: No weakness.     Coordination: Coordination normal.     Gait: Gait normal.     Deep Tendon Reflexes: Reflexes normal.  Psychiatric:        Mood and Affect: Mood normal.        Speech: Speech normal.        Behavior: Behavior normal.        Thought Content: Thought content normal.        Cognition and Memory: Cognition is impaired. Memory is impaired. She exhibits  impaired recent memory and impaired remote memory.        Judgment: Judgment normal.     Assessment & Plan:   Problem List Items Addressed This Visit       Cardiovascular and Mediastinum   Essential hypertension    Blood pressure stable at this time will not make further changes continue with Lotensin HCT      Relevant Medications   atorvastatin  (LIPITOR) 40 MG tablet   benazepril-hydrochlorthiazide (LOTENSIN HCT) 20-12.5 MG tablet     Endocrine   Diabetes mellitus without complication (HCC) - Primary    A1c at goal 6.2 plan to continue same with metformin and Actos      Relevant Medications   atorvastatin (LIPITOR) 40 MG tablet   benazepril-hydrochlorthiazide (LOTENSIN HCT) 20-12.5 MG tablet   metFORMIN (GLUCOPHAGE) 1000 MG tablet   pioglitazone (ACTOS) 30 MG tablet   Other Relevant Orders   POCT glucose (manual entry) (Completed)   POCT glycosylated hemoglobin (Hb A1C) (Completed)   Lipid panel   Comprehensive metabolic panel     Nervous and Auditory   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)    As per neurology has upcoming appointments continue Aricept at increased dose 10 mg daily      Relevant Medications   donepezil (ARICEPT) 10 MG tablet   gabapentin (NEURONTIN) 100 MG capsule     Musculoskeletal and Integument   Adhesive capsulitis of right shoulder   Relevant Medications   benazepril-hydrochlorthiazide (LOTENSIN HCT) 20-12.5 MG tablet     Other   Dyslipidemia    Reassess lipid panel      Relevant Medications   atorvastatin (LIPITOR) 40 MG tablet   Other Relevant Orders   Lipid panel   Chronic pain of left knee    Suspect chronic arthritis tricompartment left knee will refer to orthopedics      Relevant Medications   gabapentin (NEURONTIN) 100 MG capsule   Other Relevant Orders   Ambulatory referral to Orthopedic Surgery   Other Visit Diagnoses     Chronic right shoulder pain       Relevant Medications   benazepril-hydrochlorthiazide (LOTENSIN HCT) 20-12.5 MG tablet   gabapentin (NEURONTIN) 100 MG capsule      Outpatient Encounter Medications as of 09/15/2021  Medication Sig   albuterol (VENTOLIN HFA) 108 (90 Base) MCG/ACT inhaler Inhale 2 puffs into the lungs every 6 (six) hours as needed for wheezing or shortness of breath.   atorvastatin  (LIPITOR) 40 MG tablet Take 1 tablet (40 mg total) by mouth daily.   benazepril-hydrochlorthiazide (LOTENSIN HCT) 20-12.5 MG tablet Take 1 tablet by mouth daily.   Blood Glucose Monitoring Suppl (FREESTYLE FREEDOM LITE) W/DEVICE KIT As directed , dx code 250.00   donepezil (ARICEPT) 10 MG tablet 1 tablet 10 mg daily   gabapentin (NEURONTIN) 100 MG capsule Take 1 capsule (100 mg total) by mouth 3 (three) times daily.   glucose blood (FREESTYLE LITE) test strip Test once daily.   Lancets (FREESTYLE) lancets Test once daily.   metFORMIN (GLUCOPHAGE) 1000 MG tablet Take 1 tablet (1,000 mg total) by mouth 2 (two) times daily with a meal.   Multiple Vitamins-Calcium (ONE-A-DAY WOMENS FORMULA PO) Take 1 tablet by mouth daily.   pioglitazone (ACTOS) 30 MG tablet Take 0.5 tablets (15 mg total) by mouth daily.   Turmeric 500 MG CAPS See admin instructions.   [DISCONTINUED] atorvastatin (LIPITOR) 40 MG tablet Take 1 tablet (40 mg total) by mouth daily.   [  DISCONTINUED] benazepril-hydrochlorthiazide (LOTENSIN HCT) 20-12.5 MG tablet Take 1 tablet by mouth daily.   [DISCONTINUED] donepezil (ARICEPT) 10 MG tablet Take half tablet for 2 weeks, then increase to 1 tablet 10 mg daily   [DISCONTINUED] gabapentin (NEURONTIN) 100 MG capsule Take 1 capsule (100 mg total) by mouth 3 (three) times daily.   [DISCONTINUED] metFORMIN (GLUCOPHAGE) 1000 MG tablet Take 1 tablet (1,000 mg total) by mouth 2 (two) times daily with a meal.   [DISCONTINUED] pioglitazone (ACTOS) 30 MG tablet Take 0.5 tablets (15 mg total) by mouth daily.   No facility-administered encounter medications on file as of 09/15/2021.    Follow-up: Return return as needed as you are transferring care to Dr Sanda Linger in September.   Shan Levans, MD

## 2021-09-15 NOTE — Patient Instructions (Signed)
Labs today include metabolic panel and lipid panel  Refills on all medications sent to your [facility]  Referral to [facility] was made for knee pain  Keep upcoming appointments with neuropsychology in November  Dr. Joya Gaskins will continue to cover any questions or problems you have until you establish with your new primary care provider in September

## 2021-09-15 NOTE — Assessment & Plan Note (Signed)
Blood pressure stable at this time will not make further changes continue with Lotensin HCT

## 2021-09-16 ENCOUNTER — Telehealth: Payer: Self-pay

## 2021-09-16 LAB — LIPID PANEL
Chol/HDL Ratio: 2.2 ratio (ref 0.0–4.4)
Cholesterol, Total: 166 mg/dL (ref 100–199)
HDL: 74 mg/dL (ref >39)
LDL Chol Calc (NIH): 75 mg/dL (ref 0–99)
Triglycerides: 94 mg/dL (ref 0–149)
VLDL Cholesterol Cal: 17 mg/dL (ref 5–40)

## 2021-09-16 LAB — COMPREHENSIVE METABOLIC PANEL
ALT: 12 IU/L (ref 0–32)
AST: 30 IU/L (ref 0–40)
Albumin/Globulin Ratio: 1.5 (ref 1.2–2.2)
Albumin: 4.5 g/dL (ref 3.8–4.8)
Alkaline Phosphatase: 72 IU/L (ref 44–121)
BUN/Creatinine Ratio: 25 (ref 12–28)
BUN: 28 mg/dL — ABNORMAL HIGH (ref 8–27)
Bilirubin Total: 0.3 mg/dL (ref 0.0–1.2)
CO2: 19 mmol/L — ABNORMAL LOW (ref 20–29)
Calcium: 9.9 mg/dL (ref 8.7–10.3)
Chloride: 100 mmol/L (ref 96–106)
Creatinine, Ser: 1.12 mg/dL — ABNORMAL HIGH (ref 0.57–1.00)
Globulin, Total: 3 g/dL (ref 1.5–4.5)
Glucose: 116 mg/dL — ABNORMAL HIGH (ref 70–99)
Potassium: 4.3 mmol/L (ref 3.5–5.2)
Sodium: 140 mmol/L (ref 134–144)
Total Protein: 7.5 g/dL (ref 6.0–8.5)
eGFR: 50 mL/min/{1.73_m2} — ABNORMAL LOW (ref >59)

## 2021-09-16 NOTE — Telephone Encounter (Signed)
Pt was called and vm was left, Information has been sent to nurse pool.   

## 2021-09-16 NOTE — Telephone Encounter (Signed)
-----   Message from Elsie Stain, MD sent at 09/16/2021 10:56 AM EDT ----- Let pt know all labs normal  cholesterol at goal , diabetes controlled

## 2021-09-16 NOTE — Progress Notes (Signed)
Let pt know all labs normal  cholesterol at goal , diabetes controlled

## 2021-09-16 NOTE — Telephone Encounter (Signed)
-----   Message from Elsie Stain, MD sent at 09/15/2021 12:20 PM EDT ----- Let pt know her A1C today was normal

## 2021-11-25 ENCOUNTER — Ambulatory Visit: Payer: Medicare Other | Admitting: Internal Medicine
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
## 2021-12-31 DIAGNOSIS — M25562 Pain in left knee: Secondary | ICD-10-CM | POA: Diagnosis not present

## 2022-01-06 DIAGNOSIS — H524 Presbyopia: Secondary | ICD-10-CM | POA: Diagnosis not present

## 2022-01-06 DIAGNOSIS — E119 Type 2 diabetes mellitus without complications: Secondary | ICD-10-CM | POA: Diagnosis not present

## 2022-01-06 DIAGNOSIS — H2511 Age-related nuclear cataract, right eye: Secondary | ICD-10-CM | POA: Diagnosis not present

## 2022-01-06 DIAGNOSIS — H52203 Unspecified astigmatism, bilateral: Secondary | ICD-10-CM | POA: Diagnosis not present

## 2022-01-06 DIAGNOSIS — H35372 Puckering of macula, left eye: Secondary | ICD-10-CM | POA: Diagnosis not present

## 2022-01-06 DIAGNOSIS — H25011 Cortical age-related cataract, right eye: Secondary | ICD-10-CM | POA: Diagnosis not present

## 2022-01-12 ENCOUNTER — Encounter: Payer: Self-pay | Admitting: Psychology

## 2022-01-13 ENCOUNTER — Encounter: Payer: Self-pay | Admitting: Psychology

## 2022-01-13 ENCOUNTER — Ambulatory Visit (INDEPENDENT_AMBULATORY_CARE_PROVIDER_SITE_OTHER): Payer: HMO | Admitting: Psychology

## 2022-01-13 ENCOUNTER — Ambulatory Visit: Payer: HMO

## 2022-01-13 DIAGNOSIS — F0280 Dementia in other diseases classified elsewhere without behavioral disturbance: Secondary | ICD-10-CM

## 2022-01-13 DIAGNOSIS — I6781 Acute cerebrovascular insufficiency: Secondary | ICD-10-CM | POA: Diagnosis not present

## 2022-01-13 DIAGNOSIS — R4189 Other symptoms and signs involving cognitive functions and awareness: Secondary | ICD-10-CM

## 2022-01-13 DIAGNOSIS — G309 Alzheimer's disease, unspecified: Secondary | ICD-10-CM

## 2022-01-13 HISTORY — DX: Dementia in other diseases classified elsewhere, unspecified severity, without behavioral disturbance, psychotic disturbance, mood disturbance, and anxiety: F02.80

## 2022-01-13 NOTE — Progress Notes (Signed)
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 evaluation. Also reviewed were the foreseeable risks and/or discomforts and benefits of the procedure, limits of confidentiality, and mandatory reporting requirements of this provider. The patient was given the opportunity to ask questions and receive answers about the evaluation. Oral consent to participate was provided by the patient.   This evaluation was conducted by Christia Reading, Ph.D., ABPP-CN, board certified clinical neuropsychologist. Ms. Gaydos completed a clinical interview with Dr. Melvyn Novas, billed as one unit [phone], and 125 minutes of cognitive testing and scoring, billed as one unit [phone] and three additional units 96139. Psychometrist Cruzita Lederer, B.S., assisted Dr. Melvyn Novas with test administration and scoring procedures. As a separate and discrete service, Dr. Melvyn Novas spent a total of 160 minutes in interpretation and report writing billed as one unit [phone] and two units 96133.

## 2022-01-13 NOTE — Progress Notes (Signed)
   Psychometrician Note   Cognitive testing was administered to Briana Patton by Cruzita Lederer, B.S. (psychometrist) under the supervision of Dr. Christia Reading, Ph.D., licensed psychologist on 01/13/2022. [name] did not appear overtly distressed by the testing session per behavioral observation or responses across self-report questionnaires. Rest breaks were offered.    The battery of tests administered was selected by Dr. Christia Reading, Ph.D. with consideration to [name]'s current level of functioning, the nature of her symptoms, emotional and behavioral responses during interview, level of literacy, observed level of motivation/effort, and the nature of the referral question. This battery was communicated to the psychometrist. Communication between Dr. Christia Reading, Ph.D. and the psychometrist was ongoing throughout the evaluation and Dr. Christia Reading, Ph.D. was immediately accessible at all times. Dr. Christia Reading, Ph.D. provided supervision to the psychometrist on the date of this service to the extent necessary to assure the quality of all services provided.    Briana Patton will return within approximately 1-2 weeks for an interactive feedback session with Dr. Melvyn Novas at which time her test performances, clinical impressions, and treatment recommendations will be reviewed in detail. [name] understands she can contact our office should she require our assistance before this time.  A total of 125 minutes of billable time were spent face-to-face with Briana Patton by the psychometrist. This includes both test administration and scoring time. Billing for these services is reflected in the clinical report generated by Dr. Christia Reading, Ph.D.  This note reflects time spent with the psychometrician and does not include test scores or any clinical interpretations made by Dr. Melvyn Novas. The full report will follow in a separate note.

## 2022-01-20 ENCOUNTER — Ambulatory Visit (INDEPENDENT_AMBULATORY_CARE_PROVIDER_SITE_OTHER): Payer: HMO | Admitting: Psychology

## 2022-01-20 DIAGNOSIS — F0280 Dementia in other diseases classified elsewhere without behavioral disturbance: Secondary | ICD-10-CM | POA: Diagnosis not present

## 2022-01-20 DIAGNOSIS — G309 Alzheimer's disease, unspecified: Secondary | ICD-10-CM

## 2022-01-20 NOTE — Progress Notes (Signed)
   Neuropsychology Feedback Session [name]. [facility]  Reason for Referral:   Briana Patton is a 81 y.o. right-handed Hispanic female referred by Sharene Butters, PA-C, to characterize her current cognitive functioning and assist with diagnostic clarity and treatment planning in the context of subjective cognitive decline.   Feedback:   [name] completed a comprehensive neuropsychological evaluation on 01/13/2022. Please refer to that encounter for the full report and recommendations. 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 Additional impairments were exhibited across processing speed, attention/concentration, and executive functioning. She also performed poorly across a task assessing safety and judgment. Confrontation naming was variable. The underlying etiology for her dementia presentation is somewhat unclear. However, I do have concerns surrounding the presence of Alzheimer's disease. Despite some evidence to suggest the ability to learn new information, she was essentially amnestic across all memory tasks after a brief delay, raising concerns for the presence of rapid forgetting. Additionally, she generally performed poorly across yes/no recognition tasks, also suggesting an evolving storage impairment. Rapid forgetting and a memory storage impairment are the hallmark characteristics of this illness and the primary source of elevated concerns. Variability in confrontation naming is also consistent with typical disease progression. If present, this disease process would appear to be in earlier stages at the present time.   [name] was accompanied by her daughter during the current feedback session. Content of the current session focused on the results of her neuropsychological evaluation. [name] was given the opportunity to ask questions and her questions were answered. She was  encouraged to reach out should additional questions arise. A copy of her report was provided at the conclusion of the visit.      30 minutes were spent conducting the current feedback session with [name], billed as one unit [phone].

## 2022-02-01 ENCOUNTER — Ambulatory Visit (INDEPENDENT_AMBULATORY_CARE_PROVIDER_SITE_OTHER): Payer: HMO | Admitting: Physician Assistant

## 2022-02-01 VITALS — BP 130/78 | HR 62 | Resp 20 | Ht 59.0 in

## 2022-02-01 DIAGNOSIS — R413 Other amnesia: Secondary | ICD-10-CM

## 2022-02-01 NOTE — Patient Instructions (Signed)
It was a pleasure to see you today at our office.   Recommendations:  MRI of the brain, the radiology office will call you to arrange you appointment Follow up in 6 months  Continue donepezil 10 mg daily.  Please use your cane   RECOMMENDATIONS FOR ALL PATIENTS WITH MEMORY PROBLEMS: 1. Continue to exercise (Recommend 30 minutes of walking everyday, or 3 hours every week) 2. Increase social interactions - continue going to [place] and enjoy social gatherings with friends and family 3. Eat healthy, avoid fried foods and eat more fruits and vegetables 4. Maintain adequate blood pressure, blood sugar, and blood cholesterol level. Reducing the risk of stroke and cardiovascular disease also helps promoting better memory. 5. Avoid stressful situations. Live a simple life and avoid aggravations. Organize your time and prepare for the next day in anticipation. 6. Sleep well, avoid any interruptions of sleep and avoid any distractions in the bedroom that may interfere with adequate sleep quality 7. Avoid sugar, avoid sweets as there is a strong link between excessive sugar intake, diabetes, and cognitive impairment We discussed the Mediterranean diet, which has been shown to help patients reduce the risk of progressive memory disorders and reduces cardiovascular risk. This includes eating fish, eat fruits and green leafy vegetables, nuts like almonds and hazelnuts, walnuts, and also use olive oil. Avoid fast foods and fried foods as much as possible. Avoid sweets and sugar as sugar use has been linked to worsening of memory function.  There is always a concern of gradual progression of memory problems. If this is the case, then we may need to adjust level of care according to patient needs. Support, both to the patient and caregiver, should then be put into place.       FALL PRECAUTIONS: Be cautious when walking. Scan the area for obstacles that may increase the risk of trips and falls. When getting up  in the mornings, sit up at the edge of the bed for a few minutes before getting out of bed. Consider elevating the bed at the head end to avoid drop of blood pressure when getting up. Walk always in a well-lit room (use night lights in the walls). Avoid area rugs or power cords from appliances in the middle of the walkways. Use a walker or a cane if necessary and consider physical therapy for balance exercise. Get your eyesight checked regularly.  FINANCIAL OVERSIGHT: Supervision, especially oversight when making financial decisions or transactions is also recommended.  HOME SAFETY: Consider the safety of the kitchen when operating appliances like stoves, microwave oven, and blender. Consider having supervision and share cooking responsibilities until no longer able to participate in those. Accidents with firearms and other hazards in the house should be identified and addressed as well.   ABILITY TO BE LEFT ALONE: If patient is unable to contact 911 operator, consider using LifeLine, or when the need is there, arrange for someone to stay with patients. Smoking is a fire hazard, consider supervision or cessation. Risk of wandering should be assessed by caregiver and if detected at any point, supervision and safe proof recommendations should be instituted.  MEDICATION SUPERVISION: Inability to self-administer medication needs to be constantly addressed. Implement a mechanism to ensure safe administration of the medications.   DRIVING: Regarding driving, in patients with progressive memory problems, driving will be impaired. We advise to have someone else do the driving if trouble finding directions or if minor accidents are reported. Independent driving assessment is available to determine safety  of driving.   If you are interested in the driving assessment, you can contact the following:  [employer] in [place]  [place] [phone] or [phone]    [place] refers to food and lifestyle choices that are based on the traditions of countries located on the [employer]. This way of eating has been shown to help prevent certain conditions and improve outcomes for people who have chronic diseases, like kidney disease and heart disease. What are tips for following this plan? Lifestyle  Cook and eat meals together with your family, when possible. Drink enough fluid to keep your urine clear or pale yellow. Be physically active every day. This includes: Aerobic exercise like running or swimming. Leisure activities like gardening, walking, or housework. Get 7-8 hours of sleep each night. If recommended by your health care provider, drink red wine in moderation. This means 1 glass a day for nonpregnant women and 2 glasses a day for men. A glass of wine equals 5 oz (150 mL). Reading food labels  Check the serving size of packaged foods. For foods such as rice and pasta, the serving size refers to the amount of cooked product, not dry. Check the total fat in packaged foods. Avoid foods that have saturated fat or trans fats. Check the ingredients list for added sugars, such as corn syrup. Shopping  At the grocery store, buy most of your food from the areas near the walls of the store. This includes: Fresh fruits and vegetables (produce). Grains, beans, nuts, and seeds. Some of these may be available in unpackaged forms or large amounts (in bulk). Fresh seafood. Poultry and eggs. Low-fat dairy products. Buy whole ingredients instead of prepackaged foods. Buy fresh fruits and vegetables in-season from local farmers markets. Buy frozen fruits and vegetables in resealable bags. If you do not have access to quality fresh seafood, buy precooked frozen shrimp or canned fish, such as tuna, salmon, or sardines. Buy small amounts of raw or  cooked vegetables, salads, or olives from the deli or salad bar at your store. Stock your pantry so you always have certain foods on hand, such as olive oil, canned tuna, canned tomatoes, rice, pasta, and beans. Cooking  Cook foods with extra-virgin olive oil instead of using butter or other vegetable oils. Have meat as a side dish, and have vegetables or grains as your main dish. This means having meat in small portions or adding small amounts of meat to foods like pasta or stew. Use beans or vegetables instead of meat in common dishes like chili or lasagna. Experiment with different cooking methods. Try roasting or broiling vegetables instead of steaming or sauteing them. Add frozen vegetables to soups, stews, pasta, or rice. Add nuts or seeds for added healthy fat at each meal. You can add these to yogurt, salads, or vegetable dishes. Marinate fish or vegetables using olive oil, lemon juice, garlic, and fresh herbs. Meal planning  Plan to eat 1 vegetarian meal one day each week. Try to work up to 2 vegetarian meals, if possible. Eat seafood 2 or more times a week. Have healthy snacks readily available, such as: Vegetable sticks with hummus. Greek yogurt. Fruit and nut trail mix. Eat balanced meals throughout the week. This includes: Fruit: 2-3 servings a day Vegetables: 4-5 servings a day Low-fat dairy: 2 servings a day Fish, poultry, or lean meat: 1 serving a day Beans and  legumes: 2 or more servings a week Nuts and seeds: 1-2 servings a day Whole grains: 6-8 servings a day Extra-virgin olive oil: 3-4 servings a day Limit red meat and sweets to only a few servings a month What are my food choices? Mediterranean diet Recommended Grains: Whole-grain pasta. Brown rice. Bulgar wheat. Polenta. Couscous. Whole-wheat bread. [place]. Vegetables: Artichokes. Beets. Broccoli. Cabbage. Carrots. Eggplant. Green beans. Chard. Kale. Spinach. Onions. Leeks. Peas. Squash. Tomatoes.  Peppers. Radishes. Fruits: Apples. Apricots. Avocado. Berries. Bananas. Cherries. Dates. Figs. Grapes. Lemons. Melon. Oranges. Peaches. Plums. Pomegranate. Meats and other protein foods: Beans. Almonds. Sunflower seeds. Pine nuts. Peanuts. [place]. Salmon. Scallops. Shrimp. [place]. Tilapia. Clams. Oysters. Eggs. Dairy: Low-fat milk. Cheese. Greek yogurt. Beverages: Water. Red wine. Herbal tea. Fats and oils: Extra virgin olive oil. Avocado oil. Grape seed oil. Sweets and desserts: [place] yogurt with honey. Baked apples. Poached pears. Trail mix. Seasoning and other foods: Basil. Cilantro. Coriander. Cumin. Mint. Parsley. Sage. Rosemary. Tarragon. Garlic. Oregano. Thyme. Pepper. Balsalmic vinegar. Tahini. Hummus. Tomato sauce. Olives. Mushrooms. Limit these Grains: Prepackaged pasta or rice dishes. Prepackaged cereal with added sugar. Vegetables: Deep fried potatoes (french fries). Fruits: Fruit canned in syrup. Meats and other protein foods: Beef. Pork. Lamb. Poultry with skin. Hot dogs. Berniece Salines. Dairy: Ice cream. Sour cream. Whole milk. Beverages: Juice. Sugar-sweetened soft drinks. Beer. Liquor and spirits. Fats and oils: Butter. Canola oil. Vegetable oil. Beef fat (tallow). Lard. Sweets and desserts: Cookies. Cakes. Pies. Candy. Seasoning and other foods: Mayonnaise. Premade sauces and marinades. The items listed may not be a complete list. Talk with your dietitian about what dietary choices are right for you. Summary The Mediterranean diet includes both food and lifestyle choices. Eat a variety of fresh fruits and vegetables, beans, nuts, seeds, and whole grains. Limit the amount of red meat and sweets that you eat. Talk with your health care provider about whether it is safe for you to drink red wine in moderation. This means 1 glass a day for nonpregnant women and 2 glasses a day for men. A glass of wine equals 5 oz (150 mL). This information is not intended to replace advice given to you by  your health care provider. Make sure you discuss any questions you have with your health care provider. Document Released: 10/16/2015 Document Revised: 11/18/2015 Document Reviewed: 10/16/2015 Elsevier Interactive Patient Education  2017 [employer].   We have sent a referral to [facility] for your MRI and they will call you directly to schedule your appointment. They are located at [place]. If you need to contact them directly please call [phone].  Your provider has requested that you have labwork completed today. Please go to [facility] ([id number]) on the second floor of this building before leaving the office today. You do not need to check in. If you are not called within 15 minutes please check with the front desk.
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
## 2022-02-14 NOTE — Progress Notes (Unsigned)
New Patient Office Visit  Subjective:  Patient ID: [name], female    DOB: 09-Oct-1940  Age: 81 y.o. MRN: [id number]  CC:  No chief complaint on file.   HPI 04/2021 [name] presents for primary care to establish. This is 81 year old female formally followed [facility] however have not received records from that clinic so I do not have prior information to go on as far as her primary care gaps.  According to our system she will need a tetanus vaccine and we did give this to her at this visit.  She needs a hemoglobin A1c with her diabetes and we did obtain it and it was 6.6 with a blood sugar 134.  She does have longstanding diabetes. Patient does need a foot exam.  It is unclear whether she has ever had a bone density scan or hepatitis C study.  She has had 3 COVID vaccines.  Patient takes oral medications only Actos and metformin.  Patient also does have hypertension.  Patient maintains Lotensin HCT 20/12-1/2 daily.  And on arrival blood pressure was 135/80  Patient maintains atorvastatin 40 mg daily gabapentin 100 mg 3 times daily she is using metformin 1000 mg twice daily and Actos one half 30 mg tablet daily she takes this at dinnertime.  Patient has had increased memory loss recently she is forgetful about dates and where she places things and sometimes her medications.  Her family is concerned about her memory loss as well.  7/11   This patient is seen in return follow-up and saw neurology in March they felt she does likely have early Alzheimer's she has mild cognitive impairment on the [facility] scale she was started on Aricept the patient has failed increased dosage as of yet she still in the one half 10 mg tablet.  The patient on arrival has good blood pressure control 109/67 she maintains Lotensin HCT at current dose level.  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  Patient denies feeling unsafe at home Discussed the importance of regular daily schedule with inclusion of crossword puzzles to maintain brain function.  Continue to monitor mood with PCP.  Stay active at least 30 minutes at least 3 times a week.  Naps should be scheduled and should be no longer than 60 minutes and should not occur after 2 PM.  Control cardiovascular risk factors, blood pressure today is elevated at 161/80, she has been informed of the values. Mediterranean diet is recommended  Start donepezil 10 mg, half tablet for 2 weeks, then increase to 1 tablet 10 mg daily, side effects discussed Folllow up once results above are available   Today the patient is accompanied by her daughter [name] who is a former employee at our practice and she would like her mother transferred to another primary care site she has  an appointment with Dr. Sanda Linger upcoming in September this will be her last visit at our clinic  12/10 Essential hypertension       Blood pressure stable at this time will not make further changes continue with Lotensin HCT        Relevant Medications    atorvastatin (LIPITOR) 40 MG tablet    benazepril-hydrochlorthiazide (LOTENSIN HCT) 20-12.5 MG tablet        Endocrine    Diabetes mellitus without complication (HCC) - Primary      A1c at  goal 6.2 plan to continue same with metformin and Actos        Relevant Medications    atorvastatin (LIPITOR) 40 MG tablet    benazepril-hydrochlorthiazide (LOTENSIN HCT) 20-12.5 MG tablet    metFORMIN (GLUCOPHAGE) 1000 MG tablet    pioglitazone (ACTOS) 30 MG tablet    Other Relevant Orders    POCT glucose (manual entry) (Completed)    POCT glycosylated hemoglobin (Hb A1C) (Completed)    Lipid panel    Comprehensive metabolic panel        Nervous and Auditory    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)      As per neurology has upcoming appointments continue Aricept at increased dose 10 mg daily        Relevant Medications    donepezil (ARICEPT) 10 MG tablet    gabapentin (NEURONTIN) 100 MG capsule        Musculoskeletal and Integument    Adhesive capsulitis of right shoulder    Relevant Medications    benazepril-hydrochlorthiazide (LOTENSIN HCT) 20-12.5 MG tablet        Other    Dyslipidemia      Reassess lipid panel        Relevant Medications    atorvastatin (LIPITOR) 40 MG tablet    Other Relevant Orders    Lipid panel    Chronic pain of left knee      Suspect chronic arthritis tricompartment left knee will refer to orthopedics        Relevant Medications    gabapentin (NEURONTIN) 100 MG capsule    Other Relevant Orders    Ambulatory referral to Orthopedic Surgery    Other Visit Diagnoses       Chronic right shoulder pain        Relevant Medications    benazepril-hydrochlorthiazide (LOTENSIN HCT) 20-12.5 MG tablet    gabapentin (NEURONTIN) 100 MG capsule   Past Medical History:  Diagnosis Date   Abdominal pain    in past   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   Chronic kidney disease, stage 3a 05/05/2021   Dementia due to Alzheimer's disease 01/13/2022   Diverticulosis of colon 07/26/2006   Essential hypertension 07/26/2006   Generalized anxiety disorder 05/05/2021   GERD (gastroesophageal reflux  disease)    Hyperlipidemia    IBS (irritable bowel syndrome) 06/03/2011   Internal hemorrhoids without mention of complication    Morbid obesity 05/05/2021   Osteoarthritis    Palpitations 09/04/2007   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   PVD (peripheral vascular disease) 05/05/2021   S/P left knee arthroscopy 07/26/2006   Seborrheic dermatitis 05/18/2021   Short-segment Barrett's esophagus 06/03/2011   Type II diabetes mellitus 07/26/2006    Past Surgical History:  Procedure Laterality Date   ABDOMINAL HYSTERECTOMY     CESAREAN SECTION     3 times   COLONOSCOPY     KNEE ARTHROSCOPY  left knee surgery 1999   LUMBAR LAMINECTOMY     UPPER GASTROINTESTINAL ENDOSCOPY      Family History  Problem Relation Age of Onset   Diabetes Mother    Diabetes Father    Stroke Father    Alcohol abuse Sister        X2   Dementia Sister    Memory loss Sister    Diabetes Brother    Epilepsy Brother    Seizures Brother        from prior head injury at 14-60 years old   Colon cancer Neg Hx    Esophageal cancer Neg Hx    Rectal cancer Neg Hx    Stomach cancer Neg Hx     Social History   Socioeconomic History   Marital status: Married    Spouse name: Not on file   Number of children: 3   Years of education: 9   Highest education level: GED or equivalent  Occupational History   Not on file  Tobacco Use   Smoking status: Former    Types: Cigarettes    Quit date: 03/08/1998    Years since quitting: 23.9   Smokeless tobacco: Never  Substance and Sexual Activity   Alcohol use: No    Alcohol/week: 0.0 standard drinks of alcohol   Drug use: No   Sexual activity: Not on file  Other Topics Concern   Not on file  Social History Narrative   Father age 42 complications of cardiac and cerebrovascular disease   Right handed   One story home   No caffeine   Social Determinants of Health   Financial Resource Strain: Not on file  Food Insecurity: Not on file   Transportation Needs: Not on file  Physical Activity: Not on file  Stress: Not on file  Social Connections: Not on file  Intimate Partner Violence: Not on file    ROS Review of Systems  Constitutional:  Negative for chills, diaphoresis and fever.  HENT:  Negative for congestion, hearing loss, nosebleeds, sore throat and tinnitus.   Eyes:  Negative for photophobia and redness.  Respiratory:  Negative for cough, shortness of breath, wheezing and stridor.   Cardiovascular:  Negative for chest pain, palpitations and leg swelling.  Gastrointestinal:  Negative for abdominal pain, blood in stool, constipation, diarrhea, nausea and vomiting.  Endocrine: Negative for polydipsia.  Genitourinary:  Negative for dysuria, flank pain, frequency, hematuria and urgency.  Musculoskeletal:  Negative for back pain, myalgias and neck pain.  Skin:  Negative for rash.  Allergic/Immunologic: Negative for environmental allergies.  Neurological:  Negative for dizziness, tremors, seizures, weakness and headaches.       Memory loss  Hematological:  Does not bruise/bleed easily.  Psychiatric/Behavioral:  Negative for suicidal ideas. The patient is not nervous/anxious.     Objective:   Today's Vitals: There were no vitals taken for this visit. Foot exam Normal Physical Exam Vitals reviewed.  Constitutional:      Appearance: Normal appearance. She is well-developed. She is obese. She is not diaphoretic.  HENT:     Head: Normocephalic and atraumatic.     Nose: No nasal deformity, septal deviation, mucosal edema or rhinorrhea.     Right Sinus: No maxillary sinus tenderness or frontal sinus tenderness.     Left Sinus: No maxillary sinus tenderness or frontal sinus tenderness.     Mouth/Throat:     Pharynx: No oropharyngeal exudate.  Eyes:     General: No scleral icterus.  Conjunctiva/sclera: Conjunctivae normal.     Pupils: Pupils are equal, round, and reactive to light.  Neck:     Thyroid: No  thyromegaly.     Vascular: No carotid bruit or JVD.     Trachea: Trachea normal. No tracheal tenderness or tracheal deviation.  Cardiovascular:     Rate and Rhythm: Normal rate and regular rhythm.     Chest Wall: PMI is not displaced.     Pulses: Normal pulses. No decreased pulses.     Heart sounds: Normal heart sounds, S1 normal and S2 normal. Heart sounds not distant. No murmur heard.    No systolic murmur is present.     No diastolic murmur is present.     No friction rub. No gallop. No S3 or S4 sounds.  Pulmonary:     Effort: No tachypnea, accessory muscle usage or respiratory distress.     Breath sounds: No stridor. No decreased breath sounds, wheezing, rhonchi or rales.  Chest:     Chest wall: No tenderness.  Abdominal:     General: Bowel sounds are normal. There is no distension.     Palpations: Abdomen is soft. Abdomen is not rigid.     Tenderness: There is no abdominal tenderness. There is no guarding or rebound.  Musculoskeletal:        General: Normal range of motion.     Cervical back: Normal range of motion and neck supple. No edema, erythema or rigidity. No muscular tenderness. Normal range of motion.  Lymphadenopathy:     Head:     Right side of head: No submental or submandibular adenopathy.     Left side of head: No submental or submandibular adenopathy.     Cervical: No cervical adenopathy.  Skin:    General: Skin is warm and dry.     Coloration: Skin is not pale.     Findings: No rash.     Nails: There is no clubbing.  Neurological:     General: No focal deficit present.     Mental Status: She is alert and oriented to person, place, and time.     Cranial Nerves: No cranial nerve deficit.     Sensory: No sensory deficit.     Motor: No weakness.     Coordination: Coordination normal.     Gait: Gait normal.     Deep Tendon Reflexes: Reflexes normal.  Psychiatric:        Mood and Affect: Mood normal.        Speech: Speech normal.        Behavior: Behavior  normal.        Thought Content: Thought content normal.        Cognition and Memory: Cognition is impaired. Memory is impaired. She exhibits impaired recent memory and impaired remote memory.        Judgment: Judgment normal.     Assessment & Plan:   Problem List Items Addressed This Visit   None Outpatient Encounter Medications as of 02/16/2022  Medication Sig   albuterol (VENTOLIN HFA) 108 (90 Base) MCG/ACT inhaler Inhale 2 puffs into the lungs every 6 (six) hours as needed for wheezing or shortness of breath.   atorvastatin (LIPITOR) 40 MG tablet Take 1 tablet (40 mg total) by mouth daily.   benazepril-hydrochlorthiazide (LOTENSIN HCT) 20-12.5 MG tablet Take 1 tablet by mouth daily.   Blood Glucose Monitoring Suppl (FREESTYLE FREEDOM LITE) W/DEVICE KIT As directed , dx code 250.00   donepezil (ARICEPT) 10 MG  tablet 1 tablet 10 mg daily   gabapentin (NEURONTIN) 100 MG capsule Take 1 capsule (100 mg total) by mouth 3 (three) times daily.   glucose blood (FREESTYLE LITE) test strip Test once daily.   Lancets (FREESTYLE) lancets Test once daily.   metFORMIN (GLUCOPHAGE) 1000 MG tablet Take 1 tablet (1,000 mg total) by mouth 2 (two) times daily with a meal.   Multiple Vitamins-Calcium (ONE-A-DAY WOMENS FORMULA PO) Take 1 tablet by mouth daily.   pioglitazone (ACTOS) 30 MG tablet Take 0.5 tablets (15 mg total) by mouth daily.   Turmeric 500 MG CAPS See admin instructions.   No facility-administered encounter medications on file as of 02/16/2022.    Follow-up: No follow-ups on file.   Shan Levans, MD

## 2022-02-16 ENCOUNTER — Ambulatory Visit
Admission: RE | Admit: 2022-02-16 | Discharge: 2022-02-16 | Disposition: A | Discharge status: Home / Self Care | Payer: PPO | Source: Ambulatory Visit | Attending: Physician Assistant | Admitting: Physician Assistant

## 2022-02-16 ENCOUNTER — Ambulatory Visit: Payer: PPO | Attending: Critical Care Medicine | Admitting: Critical Care Medicine

## 2022-02-16 ENCOUNTER — Encounter: Payer: Self-pay | Admitting: Critical Care Medicine

## 2022-02-16 VITALS — BP 131/56 | HR 56 | Wt 170.4 lb

## 2022-02-16 DIAGNOSIS — R197 Diarrhea, unspecified: Secondary | ICD-10-CM | POA: Insufficient documentation

## 2022-02-16 DIAGNOSIS — I10 Essential (primary) hypertension: Secondary | ICD-10-CM

## 2022-02-16 DIAGNOSIS — G309 Alzheimer's disease, unspecified: Secondary | ICD-10-CM

## 2022-02-16 DIAGNOSIS — E1142 Type 2 diabetes mellitus with diabetic polyneuropathy: Secondary | ICD-10-CM | POA: Diagnosis not present

## 2022-02-16 DIAGNOSIS — Z9189 Other specified personal risk factors, not elsewhere classified: Secondary | ICD-10-CM

## 2022-02-16 DIAGNOSIS — F0280 Dementia in other diseases classified elsewhere without behavioral disturbance: Secondary | ICD-10-CM | POA: Diagnosis not present

## 2022-02-16 DIAGNOSIS — R262 Difficulty in walking, not elsewhere classified: Secondary | ICD-10-CM | POA: Diagnosis not present

## 2022-02-16 DIAGNOSIS — E119 Type 2 diabetes mellitus without complications: Secondary | ICD-10-CM

## 2022-02-16 DIAGNOSIS — R413 Other amnesia: Secondary | ICD-10-CM | POA: Diagnosis not present

## 2022-02-16 DIAGNOSIS — R531 Weakness: Secondary | ICD-10-CM | POA: Diagnosis not present

## 2022-02-16 DIAGNOSIS — M25511 Pain in right shoulder: Secondary | ICD-10-CM | POA: Diagnosis not present

## 2022-02-16 DIAGNOSIS — M7501 Adhesive capsulitis of right shoulder: Secondary | ICD-10-CM | POA: Diagnosis not present

## 2022-02-16 DIAGNOSIS — N1831 Chronic kidney disease, stage 3a: Secondary | ICD-10-CM | POA: Diagnosis not present

## 2022-02-16 DIAGNOSIS — G8929 Other chronic pain: Secondary | ICD-10-CM

## 2022-02-16 DIAGNOSIS — I6782 Cerebral ischemia: Secondary | ICD-10-CM | POA: Diagnosis not present

## 2022-02-16 LAB — POCT GLYCOSYLATED HEMOGLOBIN (HGB A1C): HbA1c, POC (controlled diabetic range): 6.3 % (ref 0.0–7.0)

## 2022-02-16 LAB — GLUCOSE, POCT (MANUAL RESULT ENTRY): POC Glucose: 116 mg/dl — AB (ref 70–99)

## 2022-02-16 MED ORDER — GABAPENTIN 100 MG PO CAPS: 100.0000 mg | ORAL_CAPSULE | Freq: Three times a day (TID) | ORAL | 2 refills | Status: DC

## 2022-02-16 MED ORDER — BENAZEPRIL-HYDROCHLOROTHIAZIDE 20-12.5 MG PO TABS: 1.0000 | ORAL_TABLET | Freq: Every day | ORAL | 2 refills | Status: DC

## 2022-02-16 MED ORDER — PIOGLITAZONE HCL 30 MG PO TABS: 15.0000 mg | ORAL_TABLET | Freq: Every day | ORAL | 3 refills | Status: DC

## 2022-02-16 MED ORDER — METRONIDAZOLE 500 MG PO TABS: 500.0000 mg | ORAL_TABLET | Freq: Three times a day (TID) | ORAL | 0 refills | Status: AC

## 2022-02-16 MED ORDER — ATORVASTATIN CALCIUM 40 MG PO TABS: 40.0000 mg | ORAL_TABLET | Freq: Every day | ORAL | 2 refills | Status: DC

## 2022-02-16 MED ORDER — METFORMIN HCL 1000 MG PO TABS: 1000.0000 mg | ORAL_TABLET | Freq: Two times a day (BID) | ORAL | 2 refills | Status: DC

## 2022-02-16 MED ORDER — DONEPEZIL HCL 10 MG PO TABS: ORAL_TABLET | ORAL | 11 refills | Status: DC

## 2022-02-16 NOTE — Assessment & Plan Note (Signed)
Hypertension well-controlled at this time no change in medication

## 2022-02-16 NOTE — Assessment & Plan Note (Signed)
Continue gabapentin.

## 2022-02-16 NOTE — Patient Instructions (Signed)
Take metronidazole 3 times a day for 5 days for your diarrhea  No other medication changes all refill sent to your [facility]  Labs include metabolic panel and blood counts will also get a urine for protein  No change in medication dosing  A bone density study be ordered  I would wait 2 weeks and get a flu vaccine at your local pharmacy like [facility]  Return to Dr. Joya Gaskins 2 months

## 2022-02-16 NOTE — Assessment & Plan Note (Signed)
Schedule bone DEXA scan

## 2022-02-16 NOTE — Assessment & Plan Note (Signed)
Reassess metabolic panel 

## 2022-02-16 NOTE — Assessment & Plan Note (Signed)
Will give short course of Flagyl check CBC and metabolic panel

## 2022-02-16 NOTE — Assessment & Plan Note (Signed)
Type 2 diabetes recallable

## 2022-02-16 NOTE — Assessment & Plan Note (Signed)
Follow-up with neurology 

## 2022-02-17 LAB — CBC WITH DIFFERENTIAL/PLATELET
Basophils Absolute: 0.1 10*3/uL (ref 0.0–0.2)
Basos: 1 %
EOS (ABSOLUTE): 0.2 10*3/uL (ref 0.0–0.4)
Eos: 2 %
Hematocrit: 33.3 % — ABNORMAL LOW (ref 34.0–46.6)
Hemoglobin: 10.5 g/dL — ABNORMAL LOW (ref 11.1–15.9)
Immature Grans (Abs): 0 10*3/uL (ref 0.0–0.1)
Immature Granulocytes: 0 %
Lymphocytes Absolute: 1.9 10*3/uL (ref 0.7–3.1)
Lymphs: 27 %
MCH: 28.9 pg (ref 26.6–33.0)
MCHC: 31.5 g/dL (ref 31.5–35.7)
MCV: 92 fL (ref 79–97)
Monocytes Absolute: 0.5 10*3/uL (ref 0.1–0.9)
Monocytes: 7 %
Neutrophils Absolute: 4.3 10*3/uL (ref 1.4–7.0)
Neutrophils: 63 %
Platelets: 207 10*3/uL (ref 150–450)
RBC: 3.63 x10E6/uL — ABNORMAL LOW (ref 3.77–5.28)
RDW: 13 % (ref 11.7–15.4)
WBC: 7 10*3/uL (ref 3.4–10.8)

## 2022-02-17 LAB — BMP8+EGFR
BUN/Creatinine Ratio: 25 (ref 12–28)
BUN: 24 mg/dL (ref 8–27)
CO2: 20 mmol/L (ref 20–29)
Calcium: 9.2 mg/dL (ref 8.7–10.3)
Chloride: 101 mmol/L (ref 96–106)
Creatinine, Ser: 0.97 mg/dL (ref 0.57–1.00)
Glucose: 109 mg/dL — ABNORMAL HIGH (ref 70–99)
Potassium: 4.5 mmol/L (ref 3.5–5.2)
Sodium: 135 mmol/L (ref 134–144)
eGFR: 59 mL/min/{1.73_m2} — ABNORMAL LOW (ref >59)

## 2022-02-17 LAB — MICROALBUMIN / CREATININE URINE RATIO
Creatinine, Urine: 42.6 mg/dL
Microalb/Creat Ratio: <7 mg/g creat (ref 0–29)
Microalbumin, Urine: <3 ug/mL

## 2022-02-17 NOTE — Progress Notes (Signed)
Let pt know labs normal

## 2022-02-18 ENCOUNTER — Other Ambulatory Visit: Payer: Self-pay

## 2022-02-18 DIAGNOSIS — F0150 Vascular dementia without behavioral disturbance: Secondary | ICD-10-CM

## 2022-02-18 NOTE — Progress Notes (Signed)
Let pt know all labs normal  no protein in urine

## 2022-02-18 NOTE — Progress Notes (Signed)
MRI of the brain moderate changes in the circulation. Mild age related changes in the brain atrophy, but no acute findings.   The neck shows some narrowing, likely due to aging bones, we need to look a little further., Will order a C-spine MRI. Thanks

## 2022-02-19 ENCOUNTER — Telehealth: Payer: Self-pay

## 2022-02-19 ENCOUNTER — Other Ambulatory Visit: Payer: HMO

## 2022-02-19 NOTE — Telephone Encounter (Signed)
Pt was called and no vm was left due to mailbox being full. Information has been sent to nurse pool.  

## 2022-02-19 NOTE — Telephone Encounter (Signed)
-----   Message from Elsie Stain, MD sent at 02/17/2022  5:54 AM EST ----- Let pt know labs normal

## 2022-02-19 NOTE — Telephone Encounter (Signed)
-----   Message from Elsie Stain, MD sent at 02/18/2022  5:41 AM EST ----- Let pt know all labs normal  no protein in urine

## 2022-03-05 ENCOUNTER — Other Ambulatory Visit: Payer: Self-pay | Admitting: Physician Assistant

## 2022-03-05 DIAGNOSIS — F0150 Vascular dementia without behavioral disturbance: Secondary | ICD-10-CM

## 2022-04-01 ENCOUNTER — Ambulatory Visit
Admission: RE | Admit: 2022-04-01 | Discharge: 2022-04-01 | Disposition: A | Discharge status: Home / Self Care | Payer: PPO | Source: Ambulatory Visit | Attending: Physician Assistant | Admitting: Physician Assistant

## 2022-04-01 DIAGNOSIS — F0150 Vascular dementia without behavioral disturbance: Secondary | ICD-10-CM

## 2022-04-01 DIAGNOSIS — M4802 Spinal stenosis, cervical region: Secondary | ICD-10-CM | POA: Diagnosis not present

## 2022-04-02 NOTE — Progress Notes (Signed)
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 Will place an order for a consult. If she develops severe symptoms, or unable to move or feel, needs to go to the ER , but looks like these changes have been going on for a long time and the neck pain is likely due to all the bad degenerative disease in the neck. We need that consult to make sure they are following up, treatment options, etc. Thanks

## 2022-04-06 DIAGNOSIS — Z6833 Body mass index (BMI) 33.0-33.9, adult: Secondary | ICD-10-CM | POA: Diagnosis not present

## 2022-04-06 DIAGNOSIS — M542 Cervicalgia: Secondary | ICD-10-CM | POA: Diagnosis not present

## 2022-04-22 ENCOUNTER — Telehealth: Payer: Self-pay | Admitting: Critical Care Medicine

## 2022-04-22 NOTE — Telephone Encounter (Signed)
Tried calling patient to schedule Medicare Annual Wellness Visit (AWV) either virtually or phone    my jabber number [phone]  No answer    Last AWV 11/03/16

## 2022-05-17 ENCOUNTER — Ambulatory Visit: Payer: Self-pay

## 2022-05-17 NOTE — Telephone Encounter (Signed)
  Chief Complaint: hematuria  Symptoms: hematuria, moderate amount per pt, L flank pain Frequency: yesterday morning  Pertinent Negatives: Patient denies dysuria or urinary pain  Disposition: [] ED /[] Urgent Care (no appt availability in office) / [x] Appointment(In office/virtual)/ []  [facility]/ [] Home Care/ [] Refused Recommended Disposition /[] [facility]/ []  Follow-up with PCP Additional Notes: scheduled OV for tomorrow at 1550 with Dr. Margarita Rana.   Summary: Blood Urine   Patient's daughter called and stated that patient has had blood in her urine for 4 days. No appointments available.         Reason for Disposition  Side (flank) or back pain present  Answer Assessment - Initial Assessment Questions 1. COLOR of URINE: "Describe the color of the urine."  (e.g., tea-colored, pink, red, bloody) "Do you have blood clots in your urine?" (e.g., none, pea, grape, small coin)     Moderate amount of blood  2. ONSET: "When did the bleeding start?"      Yesterday morning  4. PAIN with URINATION: "Is there any pain with passing your urine?" If Yes, ask: "How bad is the pain?"  (Scale 1-10; or mild, moderate, severe)    - MILD: Complains slightly about urination hurting.    - MODERATE: Interferes with normal activities.      - SEVERE: Excruciating, unwilling or unable to urinate because of the pain.      No  6. ASSOCIATED SYMPTOMS: "Are you passing urine more frequently than usual?"     no 7. OTHER SYMPTOMS: "Do you have any other symptoms?" (e.g., back/flank pain, abdomen pain, vomiting)     L sided discomfort  Protocols used: Urine - Blood In-A-AH

## 2022-05-18 ENCOUNTER — Encounter: Payer: Self-pay | Admitting: Family Medicine

## 2022-05-18 ENCOUNTER — Ambulatory Visit: Payer: PPO | Attending: Family Medicine | Admitting: Family Medicine

## 2022-05-18 VITALS — BP 111/63 | HR 63 | Temp 98.0°F | Ht 59.0 in | Wt 168.8 lb

## 2022-05-18 DIAGNOSIS — R319 Hematuria, unspecified: Secondary | ICD-10-CM | POA: Diagnosis not present

## 2022-05-18 DIAGNOSIS — R0789 Other chest pain: Secondary | ICD-10-CM

## 2022-05-18 LAB — POCT URINALYSIS DIP (CLINITEK)
Bilirubin, UA: NEGATIVE
Blood, UA: NEGATIVE
Glucose, UA: NEGATIVE mg/dL
Ketones, POC UA: NEGATIVE mg/dL
Leukocytes, UA: NEGATIVE
Nitrite, UA: NEGATIVE
POC PROTEIN,UA: NEGATIVE
Spec Grav, UA: 1.015 (ref 1.010–1.025)
Urobilinogen, UA: 0.2 E.U./dL
pH, UA: 6 (ref 5.0–8.0)

## 2022-05-18 MED ORDER — DICLOFENAC SODIUM 1 % EX GEL: 4.0000 g | Freq: Two times a day (BID) | CUTANEOUS | 1 refills | Status: DC | PRN

## 2022-05-18 NOTE — Progress Notes (Signed)
Subjective:  Patient ID: Briana Patton, female    DOB: 12/17/1940  Age: 82 y.o. MRN: [id number]  CC: Hematuria   HPI Briana Patton is a 82 y.o. year old female patient of Dr. Delford Field with a history of PVD, IBS, type 2 diabetes mellitus, dementia here for an acute visit.  Interval History:  2 days ago she noticed blood in the urine and this occurred the next day but none today and she also felt like 'someone punched her on her left side'. Denied presence of frequency, dysuria, pelvic pain, no nausea.  Urinalysis is negative for hematuria, leukocyte esterase. Past Medical History:  Diagnosis Date   Abdominal pain    in past   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   Chronic kidney disease, stage 3a 05/05/2021   Dementia due to Alzheimer's disease 01/13/2022   Diverticulosis of colon 07/26/2006   Essential hypertension 07/26/2006   Generalized anxiety disorder 05/05/2021   GERD (gastroesophageal reflux disease)    Hyperlipidemia    IBS (irritable bowel syndrome) 06/03/2011   Internal hemorrhoids without mention of complication    Morbid obesity 05/05/2021   Osteoarthritis    Palpitations 09/04/2007   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   PVD (peripheral vascular disease) 05/05/2021   S/P left knee arthroscopy 07/26/2006   Seborrheic dermatitis 05/18/2021   Short-segment Barrett's esophagus 06/03/2011   Type II diabetes mellitus 07/26/2006    Past Surgical History:  Procedure Laterality Date   ABDOMINAL HYSTERECTOMY     CESAREAN SECTION     3 times   COLONOSCOPY     KNEE ARTHROSCOPY     left knee surgery 1999   LUMBAR LAMINECTOMY     UPPER GASTROINTESTINAL ENDOSCOPY      Family History  Problem Relation Age of Onset   Diabetes Mother    Diabetes Father    Stroke Father    Alcohol abuse Sister        X2   Dementia Sister    Memory loss Sister    Diabetes Brother    Epilepsy Brother    Seizures Brother        from prior head  injury at 52-32 years old   Colon cancer Neg Hx    Esophageal cancer Neg Hx    Rectal cancer Neg Hx    Stomach cancer Neg Hx     Social History   Socioeconomic History   Marital status: Married    Spouse name: Not on file   Number of children: 3   Years of education: 9   Highest education level: GED or equivalent  Occupational History   Not on file  Tobacco Use   Smoking status: Former    Types: Cigarettes    Quit date: 03/08/1998    Years since quitting: 24.2   Smokeless tobacco: Never  Substance and Sexual Activity   Alcohol use: No    Alcohol/week: 0.0 standard drinks of alcohol   Drug use: No   Sexual activity: Not on file  Other Topics Concern   Not on file  Social History Narrative   Father age 36 complications of cardiac and cerebrovascular disease   Right handed   One story home   No caffeine   Social Determinants of Health   Financial Resource Strain: Not on file  Food Insecurity: Not on file  Transportation Needs: Not on file  Physical Activity: Not on file  Stress: Not on file  Social Connections: Not on file  Allergies  Allergen Reactions   Penicillins     WHELTS    Outpatient Medications Prior to Visit  Medication Sig Dispense Refill   albuterol (VENTOLIN HFA) 108 (90 Base) MCG/ACT inhaler Inhale 2 puffs into the lungs every 6 (six) hours as needed for wheezing or shortness of breath. 1 each 0   atorvastatin (LIPITOR) 40 MG tablet Take 1 tablet (40 mg total) by mouth daily. 90 tablet 2   benazepril-hydrochlorthiazide (LOTENSIN HCT) 20-12.5 MG tablet Take 1 tablet by mouth daily. 90 tablet 2   Blood Glucose Monitoring Suppl (FREESTYLE FREEDOM LITE) W/DEVICE KIT As directed , dx code 250.00 1 each 0   donepezil (ARICEPT) 10 MG tablet 1 tablet 10 mg daily 30 tablet 11   gabapentin (NEURONTIN) 100 MG capsule Take 1 capsule (100 mg total) by mouth 3 (three) times daily. 180 capsule 2   glucose blood (FREESTYLE LITE) test strip Test once daily. 100  each 3   Lancets (FREESTYLE) lancets Test once daily. 100 each 5   metFORMIN (GLUCOPHAGE) 1000 MG tablet Take 1 tablet (1,000 mg total) by mouth 2 (two) times daily with a meal. 180 tablet 2   Multiple Vitamins-Calcium (ONE-A-DAY WOMENS FORMULA PO) Take 1 tablet by mouth daily.     pioglitazone (ACTOS) 30 MG tablet Take 0.5 tablets (15 mg total) by mouth daily. 45 tablet 3   Turmeric 500 MG CAPS See admin instructions.     No facility-administered medications prior to visit.     ROS Review of Systems  Constitutional:  Negative for activity change and appetite change.  HENT:  Negative for sinus pressure and sore throat.   Respiratory:  Negative for chest tightness, shortness of breath and wheezing.   Cardiovascular:  Negative for chest pain and palpitations.  Gastrointestinal:  Negative for abdominal distention, abdominal pain and constipation.  Genitourinary:  Positive for hematuria.  Musculoskeletal: Negative.   Psychiatric/Behavioral:  Negative for behavioral problems and dysphoric mood.     Objective:  BP 111/63   Pulse 63   Temp 98 F (36.7 C) (Oral)   Ht 4\' 11"  (1.499 m)   Wt 168 lb 12.8 oz (76.6 kg)   SpO2 98%   BMI 34.09 kg/m      05/18/2022    3:41 PM 02/16/2022   10:46 AM 02/01/2022    9:43 AM  BP/Weight  Systolic BP 111 131 130  Diastolic BP 63 56 78  Wt. (Lbs) 168.8 170.4   BMI 34.09 kg/m2 34.42 kg/m2       Physical Exam Constitutional:      Appearance: She is well-developed.  Cardiovascular:     Rate and Rhythm: Normal rate.     Heart sounds: Normal heart sounds. No murmur heard. Pulmonary:     Effort: Pulmonary effort is normal.     Breath sounds: Normal breath sounds. No wheezing or rales.     Comments: Tenderness on deep palpation of lower half of left rib cage from posterior to anterior region Chest:     Chest wall: No tenderness.  Abdominal:     General: Bowel sounds are normal. There is no distension.     Palpations: Abdomen is soft.  There is no mass.     Tenderness: There is no abdominal tenderness. There is no right CVA tenderness or left CVA tenderness.  Musculoskeletal:        General: Normal range of motion.     Right lower leg: No edema.     Left lower  leg: No edema.  Skin:    Findings: No rash.  Neurological:     Mental Status: She is alert and oriented to person, place, and time.  Psychiatric:        Mood and Affect: Mood normal.        Latest Ref Rng & Units 02/16/2022   11:29 AM 09/15/2021   11:54 AM 11/03/2016   10:17 AM  CMP  Glucose 70 - 99 mg/dL 161  096  95   BUN 8 - 27 mg/dL 24  28  32   Creatinine 0.57 - 1.00 mg/dL 0.45  4.09  8.11   Sodium 134 - 144 mmol/L 135  140  138   Potassium 3.5 - 5.2 mmol/L 4.5  4.3  4.9   Chloride 96 - 106 mmol/L 101  100  102   CO2 20 - 29 mmol/L 20  19  32   Calcium 8.7 - 10.3 mg/dL 9.2  9.9  9.9   Total Protein 6.0 - 8.5 g/dL  7.5  7.2   Total Bilirubin 0.0 - 1.2 mg/dL  0.3  0.6   Alkaline Phos 44 - 121 IU/L  72  42   AST 0 - 40 IU/L  30  15   ALT 0 - 32 IU/L  12  12     Lipid Panel     Component Value Date/Time   CHOL 166 09/15/2021 1154   TRIG 94 09/15/2021 1154   HDL 74 09/15/2021 1154   CHOLHDL 2.2 09/15/2021 1154   CHOLHDL 3 11/03/2016 1017   VLDL 23.4 11/03/2016 1017   LDLCALC 75 09/15/2021 1154   LDLDIRECT 142.1 05/26/2012 0945    CBC    Component Value Date/Time   WBC 7.0 02/16/2022 1129   WBC 5.4 05/12/2021 0915   RBC 3.63 (L) 02/16/2022 1129   RBC 3.43 (L) 05/12/2021 0915   HGB 10.5 (L) 02/16/2022 1129   HCT 33.3 (L) 02/16/2022 1129   PLT 207 02/16/2022 1129   MCV 92 02/16/2022 1129   MCH 28.9 02/16/2022 1129   MCHC 31.5 02/16/2022 1129   MCHC 32.6 05/12/2021 0915   RDW 13.0 02/16/2022 1129   LYMPHSABS 1.9 02/16/2022 1129   MONOABS 0.3 05/04/2017 1008   EOSABS 0.2 02/16/2022 1129   BASOSABS 0.1 02/16/2022 1129    Lab Results  Component Value Date   HGBA1C 6.3 02/16/2022    Assessment & Plan:  1. Hematuria,  unspecified type UA negative for hematuria, negative for UTI She has no CVA tenderness Low suspicion for renal calculi as she does not have renal colic hence no indication to order renal stone protocol She will need cystoscopy hence referral to urology - POCT URINALYSIS DIP (CLINITEK) - Ambulatory referral to Urology  2. Costochondral pain Low suspicion for shingles as she has no rash She does not have CVA tenderness - diclofenac Sodium (VOLTAREN) 1 % GEL; Apply 4 g topically 2 (two) times daily as needed.  Dispense: 100 g; Refill: 1    Meds ordered this encounter  Medications   diclofenac Sodium (VOLTAREN) 1 % GEL    Sig: Apply 4 g topically 2 (two) times daily as needed.    Dispense:  100 g    Refill:  1    She has upcoming appointment with PCP and needs to keep that.      Hoy Register, MD, FAAFP. [facility] [place], [place] [phone]   05/18/2022, 4:21 PM

## 2022-05-27 ENCOUNTER — Encounter: Payer: PPO | Admitting: Urology

## 2022-06-10 ENCOUNTER — Encounter: Payer: PPO | Admitting: Urology

## 2022-06-17 ENCOUNTER — Encounter: Payer: Self-pay | Admitting: Critical Care Medicine

## 2022-06-17 ENCOUNTER — Ambulatory Visit: Payer: PPO | Attending: Critical Care Medicine | Admitting: Critical Care Medicine

## 2022-06-17 VITALS — BP 122/69 | HR 56 | Ht 59.0 in | Wt 166.0 lb

## 2022-06-17 DIAGNOSIS — I10 Essential (primary) hypertension: Secondary | ICD-10-CM

## 2022-06-17 DIAGNOSIS — F0280 Dementia in other diseases classified elsewhere without behavioral disturbance: Secondary | ICD-10-CM | POA: Diagnosis not present

## 2022-06-17 DIAGNOSIS — I739 Peripheral vascular disease, unspecified: Secondary | ICD-10-CM | POA: Diagnosis not present

## 2022-06-17 DIAGNOSIS — E119 Type 2 diabetes mellitus without complications: Secondary | ICD-10-CM

## 2022-06-17 DIAGNOSIS — G309 Alzheimer's disease, unspecified: Secondary | ICD-10-CM

## 2022-06-17 DIAGNOSIS — R197 Diarrhea, unspecified: Secondary | ICD-10-CM | POA: Diagnosis not present

## 2022-06-17 NOTE — Patient Instructions (Signed)
NO change in medications Return 6 months

## 2022-06-17 NOTE — Assessment & Plan Note (Signed)
Resolved

## 2022-06-17 NOTE — Assessment & Plan Note (Signed)
Discussed diet  

## 2022-06-17 NOTE — Assessment & Plan Note (Signed)
Monitor

## 2022-06-17 NOTE — Assessment & Plan Note (Signed)
Continue Aricept 

## 2022-06-17 NOTE — Assessment & Plan Note (Signed)
Blood pressure at goal monitor

## 2022-06-17 NOTE — Progress Notes (Addendum)
New Patient Office Visit  Subjective:  Patient ID: Briana Patton, female    DOB: 1940-06-21  Age: 82 y.o. MRN: [id number]  CC:  Chief Complaint  Patient presents with   Hypertension    HPI 04/2021 Briana Patton presents for primary care to establish. This is 82 year old female formally followed [facility] however have not received records from that clinic so I do not have prior information to go on as far as her primary care gaps.  According to our system she will need a tetanus vaccine and we did give this to her at this visit.  She needs a hemoglobin A1c with her diabetes and we did obtain it and it was 6.6 with a blood sugar 134.  She does have longstanding diabetes. Patient does need a foot exam.  It is unclear whether she has ever had a bone density scan or hepatitis C study.  She has had 3 COVID vaccines.  Patient takes oral medications only Actos and metformin.  Patient also does have hypertension.  Patient maintains Lotensin HCT 20/12-1/2 daily.  And on arrival blood pressure was 135/80  Patient maintains atorvastatin 40 mg daily gabapentin 100 mg 3 times daily she is using metformin 1000 mg twice daily and Actos one half 30 mg tablet daily she takes this at dinnertime.  Patient has had increased memory loss recently she is forgetful about dates and where she places things and sometimes her medications.  Her family is concerned about her memory loss as well.  7/11   This patient is seen in return follow-up and saw neurology in March they felt she does likely have early Alzheimer's she has mild cognitive impairment on the [facility] scale she was started on Aricept the patient has failed increased dosage as of yet she still in the one half 10 mg tablet.  The patient on arrival has good blood pressure control 109/67 she maintains Lotensin HCT at current dose level.  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  Patient denies feeling unsafe at home Discussed the importance of regular daily schedule with inclusion of crossword puzzles to maintain brain function.  Continue to monitor mood with PCP.  Stay active at least 30 minutes at least 3 times a week.  Naps should be scheduled and should be no longer than 60 minutes and should not occur after 2 PM.  Control cardiovascular risk factors, blood pressure today is elevated at 161/80, she has been informed of the values. Mediterranean diet is recommended  Start donepezil 10 mg, half tablet for 2 weeks, then increase to 1 tablet 10 mg daily, side effects discussed Folllow up once results above are available   Today the patient is accompanied by her daughter [name] who is a former employee at our practice and she would like her mother transferred to another  primary care site she has an appointment with Dr. Sanda Linger upcoming in September this will be her last visit at our clinic  12/10 patient seen in follow-up from July visit.  Overall she was well until she had food that apparently was spoiled 2 nights ago has had some diarrhea for 2 days feels somewhat weak.  Still has some bilateral hand numbness.  She needs a urine for microalbumin at this visit.  She has decided to stay with this clinic and  not change practices.  Memory is about the same as she is on the Aricept.  She has follow-up with neurology on her memory changes.  She also needs a bone density scan follow-up eye exam.  She agrees to receive the flu vaccine. Blood pressure on arrival is 131/56.   06/17/22 Patient seen in return follow-up.  Her diarrhea that she had the last visit has now resolved.  There are no other complaints at this visit.  Blood pressure on arrival good 122/69. Past Medical History:  Diagnosis Date   Abdominal pain    in past   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   Chronic kidney disease, stage 3a 05/05/2021   Dementia due to Alzheimer's disease 01/13/2022   Diverticulosis of colon 07/26/2006   Essential hypertension 07/26/2006   Generalized anxiety disorder 05/05/2021   GERD (gastroesophageal reflux disease)    Hyperlipidemia    IBS (irritable bowel syndrome) 06/03/2011   Internal hemorrhoids without mention of complication    Morbid obesity 05/05/2021   Osteoarthritis    Palpitations 09/04/2007   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   PVD (peripheral vascular disease) 05/05/2021   S/P left knee arthroscopy 07/26/2006   Seborrheic dermatitis 05/18/2021   Short-segment Barrett's esophagus 06/03/2011   Type II diabetes mellitus 07/26/2006    Past Surgical History:  Procedure Laterality Date   ABDOMINAL HYSTERECTOMY     CESAREAN SECTION     3 times   COLONOSCOPY     KNEE ARTHROSCOPY     left knee surgery 1999   LUMBAR LAMINECTOMY     UPPER GASTROINTESTINAL ENDOSCOPY      Family History  Problem Relation Age of Onset   Diabetes Mother    Diabetes Father    Stroke Father    Alcohol abuse Sister        X2   Dementia Sister    Memory loss Sister    Diabetes Brother    Epilepsy Brother    Seizures Brother        from prior head injury at 45-62 years old   Colon cancer Neg Hx    Esophageal cancer Neg Hx    Rectal cancer Neg Hx    Stomach cancer  Neg Hx     Social History   Socioeconomic History   Marital status: Married    Spouse name: Not on file   Number of children: 3   Years of education: 9   Highest education level: GED or equivalent  Occupational History   Not on file  Tobacco Use   Smoking status: Former    Types: Cigarettes    Quit date: 03/08/1998    Years since quitting: 24.2   Smokeless tobacco: Never  Vaping Use   Vaping Use: Never used  Substance and Sexual Activity   Alcohol use: No    Alcohol/week: 0.0 standard drinks of alcohol   Drug use: No   Sexual activity: Not on file  Other Topics Concern  Not on file  Social History Narrative   Father age 82 complications of cardiac and cerebrovascular disease   Right handed   One story home   No caffeine   Social Determinants of Health   Financial Resource Strain: Not on file  Food Insecurity: Not on file  Transportation Needs: Not on file  Physical Activity: Not on file  Stress: Not on file  Social Connections: Not on file  Intimate Partner Violence: Not on file    ROS Review of Systems  Constitutional: Negative.  Negative for chills, diaphoresis and fever.  HENT: Negative.  Negative for congestion, ear pain, hearing loss, nosebleeds, postnasal drip, rhinorrhea, sinus pressure, sore throat, tinnitus, trouble swallowing and voice change.   Eyes: Negative.  Negative for photophobia and redness.  Respiratory: Negative.  Negative for apnea, cough, choking, chest tightness, shortness of breath, wheezing and stridor.   Cardiovascular: Negative.  Negative for chest pain, palpitations and leg swelling.  Gastrointestinal:  Negative for abdominal distention, abdominal pain, blood in stool, constipation, diarrhea, nausea and vomiting.  Endocrine: Negative for polydipsia.  Genitourinary: Negative.  Negative for dysuria, flank pain, frequency, hematuria and urgency.  Musculoskeletal: Negative.  Negative for arthralgias, back pain, myalgias and neck pain.   Skin: Negative.  Negative for rash.  Allergic/Immunologic: Negative.  Negative for environmental allergies and food allergies.  Neurological: Negative.  Negative for dizziness, tremors, seizures, syncope, weakness and headaches.       Memory loss  Hematological: Negative.  Negative for adenopathy. Does not bruise/bleed easily.  Psychiatric/Behavioral: Negative.  Negative for agitation, sleep disturbance and suicidal ideas. The patient is not nervous/anxious.     Objective:   Today's Vitals: BP 122/69   Pulse (!) 56   Ht 4\' 11"  (1.499 m)   Wt 166 lb (75.3 kg)   SpO2 98%   BMI 33.53 kg/m  Foot exam Normal Physical Exam Vitals reviewed.  Constitutional:      Appearance: Normal appearance. She is well-developed. She is obese. She is not diaphoretic.  HENT:     Head: Normocephalic and atraumatic.     Nose: No nasal deformity, septal deviation, mucosal edema or rhinorrhea.     Right Sinus: No maxillary sinus tenderness or frontal sinus tenderness.     Left Sinus: No maxillary sinus tenderness or frontal sinus tenderness.     Mouth/Throat:     Pharynx: No oropharyngeal exudate.  Eyes:     General: No scleral icterus.    Conjunctiva/sclera: Conjunctivae normal.     Pupils: Pupils are equal, round, and reactive to light.  Neck:     Thyroid: No thyromegaly.     Vascular: No carotid bruit or JVD.     Trachea: Trachea normal. No tracheal tenderness or tracheal deviation.  Cardiovascular:     Rate and Rhythm: Normal rate and regular rhythm.     Chest Wall: PMI is not displaced.     Pulses: Normal pulses. No decreased pulses.     Heart sounds: Normal heart sounds, S1 normal and S2 normal. Heart sounds not distant. No murmur heard.    No systolic murmur is present.     No diastolic murmur is present.     No friction rub. No gallop. No S3 or S4 sounds.  Pulmonary:     Effort: No tachypnea, accessory muscle usage or respiratory distress.     Breath sounds: No stridor. No decreased  breath sounds, wheezing, rhonchi or rales.  Chest:     Chest wall: No tenderness.  Abdominal:     General: Bowel sounds are normal. There is no distension.     Palpations: Abdomen is soft. Abdomen is not rigid.     Tenderness: There is no abdominal tenderness. There is no guarding or rebound.  Musculoskeletal:        General: Normal range of motion.     Cervical back: Normal range of motion and neck supple. No edema, erythema or rigidity. No muscular tenderness. Normal range of motion.  Lymphadenopathy:     Head:     Right side of head: No submental or submandibular adenopathy.     Left side of head: No submental or submandibular adenopathy.     Cervical: No cervical adenopathy.  Skin:    General: Skin is warm and dry.     Coloration: Skin is not pale.     Findings: No rash.     Nails: There is no clubbing.  Neurological:     General: No focal deficit present.     Mental Status: She is alert and oriented to person, place, and time.     Cranial Nerves: No cranial nerve deficit.     Sensory: No sensory deficit.     Motor: No weakness.     Coordination: Coordination normal.     Gait: Gait normal.     Deep Tendon Reflexes: Reflexes normal.  Psychiatric:        Mood and Affect: Mood normal.        Speech: Speech normal.        Behavior: Behavior normal.        Thought Content: Thought content normal.        Cognition and Memory: Cognition is impaired. Memory is impaired. She exhibits impaired recent memory and impaired remote memory.        Judgment: Judgment normal.     Assessment & Plan:   Problem List Items Addressed This Visit       Cardiovascular and Mediastinum   Essential hypertension - Primary    Blood pressure at goal monitor      PVD (peripheral vascular disease)    Monitor        Digestive   RESOLVED: Diarrhea of presumed infectious origin    Resolved        Endocrine   Type II diabetes mellitus    Controlled on metformin        Nervous and  Auditory   Dementia due to Alzheimer's disease    Continue Aricept        Other   Morbid obesity    Discussed diet      Outpatient Encounter Medications as of 06/17/2022  Medication Sig   albuterol (VENTOLIN HFA) 108 (90 Base) MCG/ACT inhaler Inhale 2 puffs into the lungs every 6 (six) hours as needed for wheezing or shortness of breath.   atorvastatin (LIPITOR) 40 MG tablet Take 1 tablet (40 mg total) by mouth daily.   benazepril-hydrochlorthiazide (LOTENSIN HCT) 20-12.5 MG tablet Take 1 tablet by mouth daily.   Blood Glucose Monitoring Suppl (FREESTYLE FREEDOM LITE) W/DEVICE KIT As directed , dx code 250.00   diclofenac Sodium (VOLTAREN) 1 % GEL Apply 4 g topically 2 (two) times daily as needed.   donepezil (ARICEPT) 10 MG tablet 1 tablet 10 mg daily   gabapentin (NEURONTIN) 100 MG capsule Take 1 capsule (100 mg total) by mouth 3 (three) times daily.   glucose blood (FREESTYLE LITE) test strip Test once daily.   Lancets (FREESTYLE) lancets Test  once daily.   metFORMIN (GLUCOPHAGE) 1000 MG tablet Take 1 tablet (1,000 mg total) by mouth 2 (two) times daily with a meal.   Multiple Vitamins-Calcium (ONE-A-DAY WOMENS FORMULA PO) Take 1 tablet by mouth daily.   pioglitazone (ACTOS) 30 MG tablet Take 0.5 tablets (15 mg total) by mouth daily.   Turmeric 500 MG CAPS See admin instructions.   No facility-administered encounter medications on file as of 06/17/2022.    Follow-up: Return in about 6 months (around 12/17/2022) for htn, diabetes.   Shan Levans, MD

## 2022-06-17 NOTE — Assessment & Plan Note (Signed)
Controlled on metformin

## 2022-06-28 ENCOUNTER — Other Ambulatory Visit: Payer: Self-pay | Admitting: Physician Assistant

## 2022-07-19 ENCOUNTER — Telehealth: Payer: Self-pay | Admitting: Critical Care Medicine

## 2022-07-19 NOTE — Telephone Encounter (Signed)
Copied from CRM [id number]. Topic: Medicare AWV >> Jul 19, 2022 10:05 AM Rushie Goltz wrote: Reason for CRM: Called patient to schedule Medicare Annual Wellness Visit (AWV). No voicemail available to leave a message.  Last date of AWV: 11/03/2016  Please schedule an AWVS appointment at any time with [facility] VISIT.  If any questions, please contact me at [phone].    Thank you,  [facility] Direct dial  [phone]

## 2022-08-05 ENCOUNTER — Ambulatory Visit: Payer: HMO | Admitting: Physician Assistant

## 2022-08-05 ENCOUNTER — Encounter: Payer: Self-pay | Admitting: Physician Assistant

## 2022-08-05 DIAGNOSIS — Z029 Encounter for administrative examinations, unspecified: Secondary | ICD-10-CM

## 2022-08-12 ENCOUNTER — Ambulatory Visit
Admission: RE | Admit: 2022-08-12 | Discharge: 2022-08-12 | Disposition: A | Discharge status: Home / Self Care | Payer: PPO | Source: Ambulatory Visit | Attending: Critical Care Medicine | Admitting: Critical Care Medicine

## 2022-08-12 DIAGNOSIS — Z9189 Other specified personal risk factors, not elsewhere classified: Secondary | ICD-10-CM

## 2022-08-12 DIAGNOSIS — Z90721 Acquired absence of ovaries, unilateral: Secondary | ICD-10-CM | POA: Diagnosis not present

## 2022-08-12 DIAGNOSIS — Z90710 Acquired absence of both cervix and uterus: Secondary | ICD-10-CM | POA: Diagnosis not present

## 2022-08-13 ENCOUNTER — Other Ambulatory Visit: Payer: Self-pay | Admitting: Critical Care Medicine

## 2022-08-13 ENCOUNTER — Encounter: Payer: Self-pay | Admitting: Critical Care Medicine

## 2022-08-13 DIAGNOSIS — M810 Age-related osteoporosis without current pathological fracture: Secondary | ICD-10-CM | POA: Insufficient documentation

## 2022-08-13 MED ORDER — RISEDRONATE SODIUM 35 MG PO TABS: 35.0000 mg | ORAL_TABLET | ORAL | 2 refills | Status: DC

## 2022-08-13 NOTE — Progress Notes (Signed)
Let pt know she has osteoporosis and high risk fractures  she needs to  take over the counter Calcium Vitamin D tablet daily I will send a prescription for a bone building tablet to her pharmacy

## 2022-08-17 ENCOUNTER — Telehealth: Payer: Self-pay

## 2022-08-17 NOTE — Telephone Encounter (Signed)
Pt was called and is aware of results, DOB was confirmed.  ?

## 2022-08-17 NOTE — Telephone Encounter (Signed)
-----   Message from Storm Frisk, MD sent at 08/13/2022  5:30 AM EDT ----- Let pt know she has osteoporosis and high risk fractures  she needs to  take over the counter Calcium Vitamin D tablet daily I will send a prescription for a bone building tablet to her pharmacy

## 2022-08-23 NOTE — Patient Instructions (Signed)
Briana Patton , Thank you for taking time to come for your Medicare Wellness Visit. I appreciate your ongoing commitment to your health goals. Please review the following plan we discussed and let me know if I can assist you in the future.   These are the goals we discussed:  Goals   None     This is a list of the screening recommended for you and due dates:  Health Maintenance  Topic Date Due   COVID-19 Vaccine (4 - 2023-24 season) 11/06/2021   Medicare Annual Wellness Visit  12/02/2021   Complete foot exam   05/05/2022   Hemoglobin A1C  08/18/2022   Zoster (Shingles) Vaccine (1 of 2) 09/16/2022*   Flu Shot  10/07/2022   Eye exam for diabetics  11/18/2022   Yearly kidney function blood test for diabetes  02/17/2023   Yearly kidney health urinalysis for diabetes  02/17/2023   DTaP/Tdap/Td vaccine (3 - Td or Tdap) 05/06/2031   Pneumonia Vaccine  Completed   DEXA scan (bone density measurement)  Completed   HPV Vaccine  Aged Out  *Topic was postponed. The date shown is not the original due date.    Advanced directives: ***  Conditions/risks identified: ***  Next appointment: Follow up in one year for your annual wellness visit ***   Preventive Care 81 Years and Older, Female Preventive care refers to lifestyle choices and visits with your health care provider that can promote health and wellness. What does preventive care include? A yearly physical exam. This is also called an annual well check. Dental exams once or twice a year. Routine eye exams. Ask your health care provider how often you should have your eyes checked. Personal lifestyle choices, including: Daily care of your teeth and gums. Regular physical activity. Eating a healthy diet. Avoiding tobacco and drug use. Limiting alcohol use. Practicing safe sex. Taking low-dose aspirin every day. Taking vitamin and mineral supplements as recommended by your health care provider. What happens during an annual well  check? The services and screenings done by your health care provider during your annual well check will depend on your age, overall health, lifestyle risk factors, and family history of disease. Counseling  Your health care provider may ask you questions about your: Alcohol use. Tobacco use. Drug use. Emotional well-being. Home and relationship well-being. Sexual activity. Eating habits. History of falls. Memory and ability to understand (cognition). Work and work Astronomer. Reproductive health. Screening  You may have the following tests or measurements: Height, weight, and BMI. Blood pressure. Lipid and cholesterol levels. These may be checked every 5 years, or more frequently if you are over 4 years old. Skin check. Lung cancer screening. You may have this screening every year starting at age 81 if you have a 30-pack-year history of smoking and currently smoke or have quit within the past 15 years. Fecal occult blood test (FOBT) of the stool. You may have this test every year starting at age 81. Flexible sigmoidoscopy or colonoscopy. You may have a sigmoidoscopy every 5 years or a colonoscopy every 10 years starting at age 81. Hepatitis C blood test. Hepatitis B blood test. Sexually transmitted disease (STD) testing. Diabetes screening. This is done by checking your blood sugar (glucose) after you have not eaten for a while (fasting). You may have this done every 1-3 years. Bone density scan. This is done to screen for osteoporosis. You may have this done starting at age 81. Mammogram. This may be done every 1-2 years (81).  Talk to your health care provider about how often you should have regular mammograms. Talk with your health care provider about your test results, treatment options, and if necessary, the need for more tests. Vaccines  Your health care provider may recommend certain vaccines, such as: Influenza vaccine. This is recommended every year. Tetanus, diphtheria, and  acellular pertussis (Tdap, Td) vaccine. You may need a Td booster every 10 years. Zoster vaccine. You may need this after age 81. Pneumococcal 13-valent conjugate (PCV13) vaccine. One dose is recommended after age 81. Pneumococcal polysaccharide (PPSV23) vaccine. One dose is recommended after age 81. Talk to your health care provider about which screenings and vaccines you need and how often you need them. This information is not intended to replace advice given to you by your health care provider. Make sure you discuss any questions you have with your health care provider. Document Released: 03/21/2015 Document Revised: 11/12/2015 Document Reviewed: 12/24/2014 Elsevier Interactive Patient Education  2017 [place] Prevention in the Home Falls can cause injuries. They can happen to people of all ages. There are many things you can do to make your home safe and to help prevent falls. What can I do on the outside of my home? Regularly fix the edges of walkways and driveways and fix any cracks. Remove anything that might make you trip as you walk through a door, such as a raised step or threshold. Trim any bushes or trees on the path to your home. Use bright outdoor lighting. Clear any walking paths of anything that might make someone trip, such as rocks or tools. Regularly check to see if handrails are loose or broken. Make sure that both sides of any steps have handrails. Any raised decks and porches should have guardrails on the edges. Have any leaves, snow, or ice cleared regularly. Use sand or salt on walking paths during winter. Clean up any spills in your garage right away. This includes oil or grease spills. What can I do in the bathroom? Use night lights. Install grab bars by the toilet and in the tub and shower. Do not use towel bars as grab bars. Use non-skid mats or decals in the tub or shower. If you need to sit down in the shower, use a plastic, non-slip stool. Keep  the floor dry. Clean up any water that spills on the floor as soon as it happens. Remove soap buildup in the tub or shower regularly. Attach bath mats securely with double-sided non-slip rug tape. Do not have throw rugs and other things on the floor that can make you trip. What can I do in the bedroom? Use night lights. Make sure that you have a light by your bed that is easy to reach. Do not use any sheets or blankets that are too big for your bed. They should not hang down onto the floor. Have a firm chair that has side arms. You can use this for support while you get dressed. Do not have throw rugs and other things on the floor that can make you trip. What can I do in the kitchen? Clean up any spills right away. Avoid walking on wet floors. Keep items that you use a lot in easy-to-reach places. If you need to reach something above you, use a strong step stool that has a grab bar. Keep electrical cords out of the way. Do not use floor polish or wax that makes floors slippery. If you must use wax, use non-skid floor wax.  Do not have throw rugs and other things on the floor that can make you trip. What can I do with my stairs? Do not leave any items on the stairs. Make sure that there are handrails on both sides of the stairs and use them. Fix handrails that are broken or loose. Make sure that handrails are as long as the stairways. Check any carpeting to make sure that it is firmly attached to the stairs. Fix any carpet that is loose or worn. Avoid having throw rugs at the top or bottom of the stairs. If you do have throw rugs, attach them to the floor with carpet tape. Make sure that you have a light switch at the top of the stairs and the bottom of the stairs. If you do not have them, ask someone to add them for you. What else can I do to help prevent falls? Wear shoes that: Do not have high heels. Have rubber bottoms. Are comfortable and fit you well. Are closed at the toe. Do not  wear sandals. If you use a stepladder: Make sure that it is fully opened. Do not climb a closed stepladder. Make sure that both sides of the stepladder are locked into place. Ask someone to hold it for you, if possible. Clearly mark and make sure that you can see: Any grab bars or handrails. First and last steps. Where the edge of each step is. Use tools that help you move around (mobility aids) if they are needed. These include: Canes. Walkers. Scooters. Crutches. Turn on the lights when you go into a dark area. Replace any light bulbs as soon as they burn out. Set up your furniture so you have a clear path. Avoid moving your furniture around. If any of your floors are uneven, fix them. If there are any pets around you, be aware of where they are. Review your medicines with your doctor. Some medicines can make you feel dizzy. This can increase your chance of falling. Ask your doctor what other things that you can do to help prevent falls. This information is not intended to replace advice given to you by your health care provider. Make sure you discuss any questions you have with your health care provider. Document Released: 12/19/2008 Document Revised: 07/31/2015 Document Reviewed: 03/29/2014 Elsevier Interactive Patient Education  2017 [employer].

## 2022-08-24 ENCOUNTER — Ambulatory Visit: Payer: PPO | Attending: Critical Care Medicine

## 2022-08-24 VITALS — Ht 59.0 in | Wt 166.0 lb

## 2022-08-24 DIAGNOSIS — Z0000 Encounter for general adult medical examination without abnormal findings: Secondary | ICD-10-CM | POA: Diagnosis not present

## 2022-08-24 NOTE — Progress Notes (Signed)
Subjective:   Briana Patton is a 82 y.o. female who presents for Medicare Annual (Subsequent) preventive examination.  Visit Complete: Virtual  I connected with  Briana Patton on 08/24/22 by a audio enabled telemedicine application and verified that I am speaking with the correct person using two identifiers.  Patient Location: Home  Provider Location: Home Office  I discussed the limitations of evaluation and management by telemedicine. The patient expressed understanding and agreed to proceed.  Review of Systems     Cardiac Risk Factors include: advanced age (>71men, >70 women);diabetes mellitus;hypertension     Objective:    Today's Vitals   08/24/22 1337  Weight: 166 lb (75.3 kg)  Height: 4\' 11"  (1.499 m)   Body mass index is 33.53 kg/m.     08/24/2022    1:41 PM 02/01/2022    9:43 AM 05/12/2021    7:58 AM 08/15/2020    8:11 PM  Advanced Directives  Does Patient Have a Medical Advance Directive? No No No No  Would patient like information on creating a medical advance directive? Yes (MAU/Ambulatory/Procedural Areas - Information given)       Current Medications (verified) Outpatient Encounter Medications as of 08/24/2022  Medication Sig   albuterol (VENTOLIN HFA) 108 (90 Base) MCG/ACT inhaler Inhale 2 puffs into the lungs every 6 (six) hours as needed for wheezing or shortness of breath.   atorvastatin (LIPITOR) 40 MG tablet Take 1 tablet (40 mg total) by mouth daily.   benazepril-hydrochlorthiazide (LOTENSIN HCT) 20-12.5 MG tablet Take 1 tablet by mouth daily.   Blood Glucose Monitoring Suppl (FREESTYLE FREEDOM LITE) W/DEVICE KIT As directed , dx code 250.00   diclofenac Sodium (VOLTAREN) 1 % GEL Apply 4 g topically 2 (two) times daily as needed.   donepezil (ARICEPT) 10 MG tablet TAKE 1/2 TABLET BY MOUTH FOR 2 WEEKS, THEN INCREASE TO 1 TABLET DAILY   gabapentin (NEURONTIN) 100 MG capsule Take 1 capsule (100 mg total) by mouth 3 (three) times daily.   glucose  blood (FREESTYLE LITE) test strip Test once daily.   Lancets (FREESTYLE) lancets Test once daily.   metFORMIN (GLUCOPHAGE) 1000 MG tablet Take 1 tablet (1,000 mg total) by mouth 2 (two) times daily with a meal.   Multiple Vitamins-Calcium (ONE-A-DAY WOMENS FORMULA PO) Take 1 tablet by mouth daily.   pioglitazone (ACTOS) 30 MG tablet Take 0.5 tablets (15 mg total) by mouth daily.   risedronate (ACTONEL) 35 MG tablet Take 1 tablet (35 mg total) by mouth every 7 (seven) days. with water on empty stomach, nothing by mouth or lie down for next 30 minutes.   Turmeric 500 MG CAPS See admin instructions.   No facility-administered encounter medications on file as of 08/24/2022.    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   Diverticulosis of colon 07/26/2006   Essential hypertension 07/26/2006   Generalized anxiety disorder 05/05/2021   GERD (gastroesophageal reflux disease)    Hyperlipidemia    IBS (irritable bowel syndrome) 06/03/2011   Internal hemorrhoids without mention of complication    Morbid obesity 05/05/2021   Osteoarthritis    Palpitations 09/04/2007   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   PVD (peripheral vascular disease) 05/05/2021   S/P left knee arthroscopy 07/26/2006   Seborrheic  dermatitis 05/18/2021   Short-segment Barrett's esophagus 06/03/2011   Type II diabetes mellitus 07/26/2006   Past Surgical History:  Procedure Laterality Date   ABDOMINAL HYSTERECTOMY     CESAREAN SECTION     3 times   COLONOSCOPY     KNEE ARTHROSCOPY     left knee surgery 1999   LUMBAR LAMINECTOMY     UPPER GASTROINTESTINAL ENDOSCOPY     Family History  Problem Relation Age of Onset   Diabetes Mother    Diabetes Father    Stroke Father     Alcohol abuse Sister        X2   Dementia Sister    Memory loss Sister    Diabetes Brother    Epilepsy Brother    Seizures Brother        from prior head injury at 30-80 years old   Colon cancer Neg Hx    Esophageal cancer Neg Hx    Rectal cancer Neg Hx    Stomach cancer Neg Hx    Social History   Socioeconomic History   Marital status: Married    Spouse name: Not on file   Number of children: 3   Years of education: 9   Highest education level: GED or equivalent  Occupational History   Not on file  Tobacco Use   Smoking status: Former    Types: Cigarettes    Quit date: 03/08/1998    Years since quitting: 24.4   Smokeless tobacco: Never  Vaping Use   Vaping Use: Never used  Substance and Sexual Activity   Alcohol use: No    Alcohol/week: 0.0 standard drinks of alcohol   Drug use: No   Sexual activity: Not on file  Other Topics Concern   Not on file  Social History Narrative   Father age 41 complications of cardiac and cerebrovascular disease   Right handed   One story home   No caffeine   Social Determinants of Health   Financial Resource Strain: Low Risk  (08/24/2022)   Overall Financial Resource Strain (CARDIA)    Difficulty of Paying Living Expenses: Not hard at all  Food Insecurity: No Food Insecurity (08/24/2022)   Hunger Vital Sign    Worried About Running Out of Food in the Last Year: Never true    Ran Out of Food in the Last Year: Never true  Transportation Needs: No Transportation Needs (08/24/2022)   PRAPARE - Administrator, Civil Service (Medical): No    Lack of Transportation (Non-Medical): No  Physical Activity: Insufficiently Active (08/24/2022)   Exercise Vital Sign    Days of Exercise per Week: 3 days    Minutes of Exercise per Session: 30 min  Stress: No Stress Concern Present (08/24/2022)   [employer] of Occupational Health - Occupational Stress Questionnaire    Feeling of Stress : Not at all  Social Connections: Moderately  Integrated (08/24/2022)   Social Connection and Isolation Panel [NHANES]    Frequency of Communication with Friends and Family: More than three times a week    Frequency of Social Gatherings with Friends and Family: Three times a week    Attends Religious Services: More than 4 times per year    Active Member of Clubs or Organizations: No    Attends Banker Meetings: Never    Marital Status: Married    Tobacco Counseling Counseling given: Not Answered   Clinical Intake:  Pre-visit preparation completed: Yes  Pain : No/denies  pain     Diabetes: Yes CBG done?: No Did pt. bring in CBG monitor from home?: No  How often do you need to have someone help you when you read instructions, pamphlets, or other written materials from your doctor or pharmacy?: 1 - Never  Interpreter Needed?: No  Information entered by :: Kandis Fantasia LPN   Activities of Daily Living    08/24/2022    1:41 PM  In your present state of health, do you have any difficulty performing the following activities:  Hearing? 0  Vision? 0  Difficulty concentrating or making decisions? 1  Walking or climbing stairs? 0  Dressing or bathing? 0  Doing errands, shopping? 1  Preparing Food and eating ? N  Using the Toilet? N  In the past six months, have you accidently leaked urine? N  Do you have problems with loss of bowel control? N  Managing your Medications? Y  Managing your Finances? Y  Housekeeping or managing your Housekeeping? N    Patient Care Team: Storm Frisk, MD as PCP - General (Pulmonary Disease) Janet Berlin, MD as Consulting Physician (Ophthalmology)  Indicate any recent Medical Services you may have received from other than [facility] providers in the past year (date may be approximate).     Assessment:   This is a routine wellness examination for [facility].  Hearing/Vision screen Hearing Screening - Comments:: Denies hearing difficulties   Vision Screening - Comments::  Wears rx glasses - up to date with routine eye exams with Dr. Burgess Estelle    Dietary issues and exercise activities discussed:     Goals Addressed             This Visit's Progress    Remain active        Depression Screen    08/24/2022    1:40 PM 06/17/2022   11:14 AM 05/18/2022    3:42 PM 05/05/2021   10:43 AM 05/05/2016   10:54 AM 06/07/2014    8:36 AM 04/05/2014    8:47 AM  PHQ 2/9 Scores  PHQ - 2 Score 0 0 0 0 0 0 0  PHQ- 9 Score  0 0        Fall Risk    08/24/2022    1:41 PM 06/17/2022   11:10 AM 05/18/2022    3:42 PM 02/16/2022   10:49 AM 02/01/2022    9:43 AM  Fall Risk   Falls in the past year? 0 0 0 0 0  Number falls in past yr: 0 0 0 0 0  Injury with Fall? 0 0 0 0 0  Risk for fall due to : No Fall Risks No Fall Risks  No Fall Risks   Follow up Falls prevention discussed;Education provided;Falls evaluation completed Falls evaluation completed   Falls evaluation completed    MEDICARE RISK AT HOME:  Medicare Risk at Home - 08/24/22 1341     Any stairs in or around the home? Yes    If so, are there any without handrails? No    Home free of loose throw rugs in walkways, pet beds, electrical cords, etc? Yes    Adequate lighting in your home to reduce risk of falls? Yes    Life alert? No    Use of a cane, walker or w/c? No    Grab bars in the bathroom? Yes    Shower chair or bench in shower? No    Elevated toilet seat or a handicapped toilet? Yes  TIMED UP AND GO:  Was the test performed?  No    Cognitive Function:      05/12/2021    8:00 AM  Montreal Cognitive Assessment   Visuospatial/ Executive (0/5) 1  Naming (0/3) 3  Attention: Read list of digits (0/2) 0  Attention: Read list of letters (0/1) 1  Attention: Serial 7 subtraction starting at 100 (0/3) 1  Language: Repeat phrase (0/2) 0  Language : Fluency (0/1) 0  Abstraction (0/2) 2  Delayed Recall (0/5) 0  Orientation (0/6) 3  Total 11  Adjusted Score (based on education) 12       08/24/2022    1:41 PM  6CIT Screen  What Year? 0 points  What month? 0 points  What time? 0 points  Count back from 20 0 points  Months in reverse 4 points  Repeat phrase 6 points  Total Score 10 points    Immunizations Immunization History  Administered Date(s) Administered   Influenza Split 03/16/2011   Influenza Whole 11/30/2007, 01/17/2009, 12/26/2009   Influenza, High Dose Seasonal PF 12/02/2020   Influenza,inj,Quad PF,6+ Mos 12/05/2012   PFIZER(Purple Top)SARS-COV-2 Vaccination 05/18/2019, 06/08/2019, 01/23/2020   Pneumococcal Conjugate-13 05/29/2013   Pneumococcal Polysaccharide-23 12/26/2009   Td 12/26/2009   Tdap 05/05/2021    TDAP status: Up to date  Pneumococcal vaccine status: Up to date  Covid-19 vaccine status: Information provided on how to obtain vaccines.   Qualifies for Shingles Vaccine? Yes   Zostavax completed No   Shingrix Completed?: No.    Education has been provided regarding the importance of this vaccine. Patient has been advised to call insurance company to determine out of pocket expense if they have not yet received this vaccine. Advised may also receive vaccine at local pharmacy or Health Dept. Verbalized acceptance and understanding.  Screening Tests Health Maintenance  Topic Date Due   COVID-19 Vaccine (4 - 2023-24 season) 11/06/2021   FOOT EXAM  05/05/2022   HEMOGLOBIN A1C  08/18/2022   Zoster Vaccines- Shingrix (1 of 2) 09/16/2022 (Originally 12/05/1991)   INFLUENZA VACCINE  10/07/2022   OPHTHALMOLOGY EXAM  11/18/2022   Diabetic kidney evaluation - eGFR measurement  02/17/2023   Diabetic kidney evaluation - Urine ACR  02/17/2023   Medicare Annual Wellness (AWV)  08/24/2023   DTaP/Tdap/Td (3 - Td or Tdap) 05/06/2031   Pneumonia Vaccine 95+ Years old  Completed   DEXA SCAN  Completed   HPV VACCINES  Aged Out    Health Maintenance  Health Maintenance Due  Topic Date Due   COVID-19 Vaccine (4 - 2023-24 season) 11/06/2021    FOOT EXAM  05/05/2022   HEMOGLOBIN A1C  08/18/2022    Colorectal cancer screening: No longer required.   Mammogram status: No longer required due to age and preference.  Bone Density status: Completed 08/12/22. Results reflect: Bone density results: OSTEOPOROSIS. Repeat every 2 years.  Lung Cancer Screening: (Low Dose CT Chest recommended if Age 12-80 years, 20 pack-year currently smoking OR have quit w/in 15years.) does not qualify.   Lung Cancer Screening Referral: n/a  Additional Screening:  Hepatitis C Screening: does not qualify  Vision Screening: Recommended annual ophthalmology exams for early detection of glaucoma and other disorders of the eye. Is the patient up to date with their annual eye exam?  Yes  Who is the provider or what is the name of the office in which the patient attends annual eye exams? Dr. Burgess Estelle If pt is not established with a provider, would they  like to be referred to a provider to establish care? No .   Dental Screening: Recommended annual dental exams for proper oral hygiene  Diabetic Foot Exam: Diabetic Foot Exam: Overdue, Pt has been advised about the importance in completing this exam. Pt is scheduled for diabetic foot exam on at next office visit.  Community Resource Referral / Chronic Care Management: CRR required this visit?  No   CCM required this visit?  No     Plan:     I have personally reviewed and noted the following in the patient's chart:   Medical and social history Use of alcohol, tobacco or illicit drugs  Current medications and supplements including opioid prescriptions. Patient is not currently taking opioid prescriptions. Functional ability and status Nutritional status Physical activity Advanced directives List of other physicians Hospitalizations, surgeries, and ER visits in previous 12 months Vitals Screenings to include cognitive, depression, and falls Referrals and appointments  In addition, I have reviewed and  discussed with patient certain preventive protocols, quality metrics, and best practice recommendations. A written personalized care plan for preventive services as well as general preventive health recommendations were provided to patient.     Kandis Fantasia [place], [place]   05/29/4008   After Visit Summary: (Mail) Due to this being a telephonic visit, the after visit summary with patients personalized plan was offered to patient via mail   Nurse Notes: No concerns

## 2022-10-13 ENCOUNTER — Other Ambulatory Visit: Payer: Self-pay | Admitting: Pharmacist

## 2022-10-13 DIAGNOSIS — Z79899 Other long term (current) drug therapy: Secondary | ICD-10-CM

## 2022-10-13 NOTE — Progress Notes (Signed)
Patient ID: Briana Patton, female   DOB: 1940-10-04, 82 y.o.   MRN: [id number]  Pharmacy Quality Measure Review  This patient is appearing on a report for being at risk of failing the adherence measure for cholesterol (statin) medications this calendar year.   Medication: atorvastatin Last fill date: 09/24/2022 for 90 day supply  Insurance report was not up to date. No action needed at this time. Called and verified with patient's daughter that she is taking medication as prescribed.   Butch Penny, PharmD, Patsy [place], CPP Clinical Pharmacist [facility] [phone]

## 2022-11-11 ENCOUNTER — Other Ambulatory Visit: Payer: Self-pay | Admitting: Critical Care Medicine

## 2022-11-11 DIAGNOSIS — Z1231 Encounter for screening mammogram for malignant neoplasm of breast: Secondary | ICD-10-CM

## 2022-12-08 ENCOUNTER — Ambulatory Visit
Admission: RE | Admit: 2022-12-08 | Discharge: 2022-12-08 | Disposition: A | Discharge status: Home / Self Care | Payer: PPO | Source: Ambulatory Visit | Attending: Critical Care Medicine | Admitting: Critical Care Medicine

## 2022-12-08 DIAGNOSIS — Z1231 Encounter for screening mammogram for malignant neoplasm of breast: Secondary | ICD-10-CM | POA: Diagnosis not present

## 2022-12-13 ENCOUNTER — Telehealth: Payer: Self-pay

## 2022-12-13 NOTE — Telephone Encounter (Signed)
Pt was called and is aware of results, DOB was confirmed.  ?

## 2022-12-13 NOTE — Telephone Encounter (Signed)
-----   Message from Roney Jaffe sent at 12/13/2022 11:21 AM EDT ----- Please call patient and let her know that her mammogram did not show any evidence of malignancy.  Recommend repeat screening mammogram in 1 year.

## 2022-12-22 ENCOUNTER — Ambulatory Visit: Payer: PPO | Attending: Critical Care Medicine | Admitting: Family Medicine

## 2022-12-22 ENCOUNTER — Encounter: Payer: Self-pay | Admitting: Family Medicine

## 2022-12-22 ENCOUNTER — Ambulatory Visit: Payer: PPO | Admitting: Critical Care Medicine

## 2022-12-22 VITALS — BP 150/65 | HR 57 | Ht 59.0 in | Wt 167.8 lb

## 2022-12-22 DIAGNOSIS — G309 Alzheimer's disease, unspecified: Secondary | ICD-10-CM

## 2022-12-22 DIAGNOSIS — E1159 Type 2 diabetes mellitus with other circulatory complications: Secondary | ICD-10-CM | POA: Diagnosis not present

## 2022-12-22 DIAGNOSIS — M810 Age-related osteoporosis without current pathological fracture: Secondary | ICD-10-CM | POA: Diagnosis not present

## 2022-12-22 DIAGNOSIS — I152 Hypertension secondary to endocrine disorders: Secondary | ICD-10-CM

## 2022-12-22 DIAGNOSIS — M1712 Unilateral primary osteoarthritis, left knee: Secondary | ICD-10-CM | POA: Diagnosis not present

## 2022-12-22 DIAGNOSIS — F0280 Dementia in other diseases classified elsewhere without behavioral disturbance: Secondary | ICD-10-CM

## 2022-12-22 DIAGNOSIS — Z7984 Long term (current) use of oral hypoglycemic drugs: Secondary | ICD-10-CM | POA: Diagnosis not present

## 2022-12-22 DIAGNOSIS — E119 Type 2 diabetes mellitus without complications: Secondary | ICD-10-CM

## 2022-12-22 LAB — POCT GLYCOSYLATED HEMOGLOBIN (HGB A1C): HbA1c, POC (controlled diabetic range): 6.1 % (ref 0.0–7.0)

## 2022-12-22 MED ORDER — BENAZEPRIL-HYDROCHLOROTHIAZIDE 20-12.5 MG PO TABS
1.0000 | ORAL_TABLET | Freq: Every day | ORAL | 1 refills | Status: DC
Start: 2022-12-22 — End: 2023-07-13

## 2022-12-22 MED ORDER — PIOGLITAZONE HCL 15 MG PO TABS
15.0000 mg | ORAL_TABLET | Freq: Every day | ORAL | 1 refills | Status: DC
Start: 2022-12-22 — End: 2023-06-15

## 2022-12-22 MED ORDER — METFORMIN HCL 1000 MG PO TABS: 1000.0000 mg | ORAL_TABLET | Freq: Two times a day (BID) | ORAL | 1 refills | Status: DC

## 2022-12-22 MED ORDER — GABAPENTIN 100 MG PO CAPS: 100.0000 mg | ORAL_CAPSULE | Freq: Three times a day (TID) | ORAL | 1 refills | Status: DC

## 2022-12-22 MED ORDER — RISEDRONATE SODIUM 35 MG PO TABS: 35.0000 mg | ORAL_TABLET | ORAL | 1 refills | Status: DC

## 2022-12-22 MED ORDER — DICLOFENAC SODIUM 1 % EX GEL: 4.0000 g | Freq: Two times a day (BID) | CUTANEOUS | 1 refills | Status: AC | PRN

## 2022-12-22 MED ORDER — ATORVASTATIN CALCIUM 40 MG PO TABS: 40.0000 mg | ORAL_TABLET | Freq: Every day | ORAL | 1 refills | Status: DC

## 2022-12-22 NOTE — Progress Notes (Signed)
Subjective:  Patient ID: Briana Patton, female    DOB: June 09, 1940  Age: 82 y.o. MRN: [id number]  CC: Medical Management of Chronic Issues   HPI Briana Patton is a 82 y.o. year old female with a history of PVD, IBS, type 2 diabetes mellitus (A1c 6.1), dementia.  Interval History: Discussed the use of AI scribe software for clinical note transcription with the patient, who gave verbal consent to proceed.  She presents for a follow-up visit. The patient admits to not always checking her blood sugars at home but reports taking metformin and Actos for diabetes control.  She is due for an eye exam.  The patient's blood pressure was elevated at the visit, and the patient admitted to not taking her blood pressure medication, benazepril/hydrochlorothiazide, today. The patient also reports not checking her blood pressure at home.  The patient has chronic knee pain and is on gabapentin, which was initially prescribed post left knee arthroscopic surgery but she does not think it is effective.  Her husband states her left lower leg usually feels cold even during the summer. The patient is considering a knee replacement and will be referred to orthopedics for further evaluation.  The patient is also on donepezil for dementia prescribed by neurology.  The patient's family member reports that the patient's memory has been stable.         Past Medical History:  Diagnosis Date   Abdominal pain    in past   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   Chronic kidney disease, stage 3a 05/05/2021   Dementia due to Alzheimer's disease 01/13/2022   Diverticulosis of colon 07/26/2006   Essential hypertension 07/26/2006   Generalized anxiety disorder 05/05/2021   GERD (gastroesophageal reflux disease)    Hyperlipidemia    IBS (irritable bowel syndrome) 06/03/2011   Internal hemorrhoids without mention of complication    Morbid obesity 05/05/2021   Osteoarthritis    Palpitations 09/04/2007    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   PVD (peripheral vascular disease) 05/05/2021   S/P left knee arthroscopy 07/26/2006   Seborrheic dermatitis 05/18/2021   Short-segment Barrett's esophagus 06/03/2011   Type II diabetes mellitus 07/26/2006    Past Surgical History:  Procedure Laterality Date   ABDOMINAL HYSTERECTOMY     CESAREAN SECTION     3 times   COLONOSCOPY     KNEE ARTHROSCOPY     left knee surgery 1999   LUMBAR LAMINECTOMY     UPPER GASTROINTESTINAL ENDOSCOPY      Family History  Problem Relation Age of Onset   Diabetes Mother    Diabetes Father    Stroke Father    Alcohol abuse Sister        X2   Dementia Sister    Memory loss Sister    Diabetes Brother    Epilepsy Brother    Seizures Brother        from prior head injury at 64-35 years old   Colon cancer Neg Hx    Esophageal cancer Neg Hx    Rectal cancer Neg Hx    Stomach cancer Neg Hx     Social History   Socioeconomic History   Marital status: Married    Spouse name: Not on file   Number of children: 3   Years of education: 9   Highest education level: GED or equivalent  Occupational History   Not on file  Tobacco Use   Smoking status: Former    Current  packs/day: 0.00    Types: Cigarettes    Quit date: 03/08/1998    Years since quitting: 24.8   Smokeless tobacco: Never  Vaping Use   Vaping status: Never Used  Substance and Sexual Activity   Alcohol use: No    Alcohol/week: 0.0 standard drinks of alcohol   Drug use: No   Sexual activity: Not on file  Other Topics Concern   Not on file  Social History Narrative   Father age 68 complications of cardiac and cerebrovascular disease   Right handed   One story home   No caffeine   Social Determinants of Health   Financial Resource Strain: Low Risk  (08/24/2022)   Overall Financial Resource Strain (CARDIA)    Difficulty of Paying Living Expenses: Not hard at all  Food Insecurity: No Food Insecurity (08/24/2022)    Hunger Vital Sign    Worried About Running Out of Food in the Last Year: Never true    Ran Out of Food in the Last Year: Never true  Transportation Needs: No Transportation Needs (08/24/2022)   PRAPARE - Administrator, Civil Service (Medical): No    Lack of Transportation (Non-Medical): No  Physical Activity: Insufficiently Active (08/24/2022)   Exercise Vital Sign    Days of Exercise per Week: 3 days    Minutes of Exercise per Session: 30 min  Stress: No Stress Concern Present (08/24/2022)   [employer] of Occupational Health - Occupational Stress Questionnaire    Feeling of Stress : Not at all  Social Connections: Moderately Integrated (08/24/2022)   Social Connection and Isolation Panel [NHANES]    Frequency of Communication with Friends and Family: More than three times a week    Frequency of Social Gatherings with Friends and Family: Three times a week    Attends Religious Services: More than 4 times per year    Active Member of Clubs or Organizations: No    Attends Banker Meetings: Never    Marital Status: Married    Allergies  Allergen Reactions   Penicillins     WHELTS    Outpatient Medications Prior to Visit  Medication Sig Dispense Refill   albuterol (VENTOLIN HFA) 108 (90 Base) MCG/ACT inhaler Inhale 2 puffs into the lungs every 6 (six) hours as needed for wheezing or shortness of breath. 1 each 0   Blood Glucose Monitoring Suppl (FREESTYLE FREEDOM LITE) W/DEVICE KIT As directed , dx code 250.00 1 each 0   donepezil (ARICEPT) 10 MG tablet TAKE 1/2 TABLET BY MOUTH FOR 2 WEEKS, THEN INCREASE TO 1 TABLET DAILY 30 tablet 0   glucose blood (FREESTYLE LITE) test strip Test once daily. 100 each 3   Lancets (FREESTYLE) lancets Test once daily. 100 each 5   Multiple Vitamins-Calcium (ONE-A-DAY WOMENS FORMULA PO) Take 1 tablet by mouth daily.     Turmeric 500 MG CAPS See admin instructions.     atorvastatin (LIPITOR) 40 MG tablet Take 1 tablet (40  mg total) by mouth daily. 90 tablet 2   benazepril-hydrochlorthiazide (LOTENSIN HCT) 20-12.5 MG tablet Take 1 tablet by mouth daily. 90 tablet 2   diclofenac Sodium (VOLTAREN) 1 % GEL Apply 4 g topically 2 (two) times daily as needed. 100 g 1   gabapentin (NEURONTIN) 100 MG capsule Take 1 capsule (100 mg total) by mouth 3 (three) times daily. 180 capsule 2   metFORMIN (GLUCOPHAGE) 1000 MG tablet Take 1 tablet (1,000 mg total) by mouth 2 (two) times daily  with a meal. 180 tablet 2   pioglitazone (ACTOS) 30 MG tablet Take 0.5 tablets (15 mg total) by mouth daily. 45 tablet 3   risedronate (ACTONEL) 35 MG tablet Take 1 tablet (35 mg total) by mouth every 7 (seven) days. with water on empty stomach, nothing by mouth or lie down for next 30 minutes. 12 tablet 2   No facility-administered medications prior to visit.     ROS Review of Systems  Constitutional:  Negative for activity change and appetite change.  HENT:  Negative for sinus pressure and sore throat.   Respiratory:  Negative for chest tightness, shortness of breath and wheezing.   Cardiovascular:  Negative for chest pain and palpitations.  Gastrointestinal:  Negative for abdominal distention, abdominal pain and constipation.  Genitourinary: Negative.   Musculoskeletal:        See HPI  Psychiatric/Behavioral:  Negative for behavioral problems and dysphoric mood.     Objective:  BP (!) 150/65   Pulse (!) 57   Ht 4\' 11"  (1.499 m)   Wt 167 lb 12.8 oz (76.1 kg)   SpO2 98%   BMI 33.89 kg/m      12/22/2022   10:44 AM 08/24/2022    1:37 PM 06/17/2022   11:11 AM  BP/Weight  Systolic BP 150 -- 122  Diastolic BP 65 -- 69  Wt. (Lbs) 167.8 166 166  BMI 33.89 kg/m2 33.53 kg/m2 33.53 kg/m2      Physical Exam Constitutional:      Appearance: She is well-developed.  Cardiovascular:     Rate and Rhythm: Bradycardia present.     Heart sounds: Normal heart sounds. No murmur heard. Pulmonary:     Effort: Pulmonary effort is  normal.     Breath sounds: Normal breath sounds. No wheezing or rales.  Chest:     Chest wall: No tenderness.  Abdominal:     General: Bowel sounds are normal. There is no distension.     Palpations: Abdomen is soft. There is no mass.     Tenderness: There is no abdominal tenderness.  Musculoskeletal:        General: Normal range of motion.     Right lower leg: No edema.     Left lower leg: No edema.  Neurological:     Mental Status: She is alert and oriented to person, place, and time.  Psychiatric:        Mood and Affect: Mood normal.        Latest Ref Rng & Units 02/16/2022   11:29 AM 09/15/2021   11:54 AM 11/03/2016   10:17 AM  CMP  Glucose 70 - 99 mg/dL 295  284  95   BUN 8 - 27 mg/dL 24  28  32   Creatinine 0.57 - 1.00 mg/dL 1.32  4.40  1.02   Sodium 134 - 144 mmol/L 135  140  138   Potassium 3.5 - 5.2 mmol/L 4.5  4.3  4.9   Chloride 96 - 106 mmol/L 101  100  102   CO2 20 - 29 mmol/L 20  19  32   Calcium 8.7 - 10.3 mg/dL 9.2  9.9  9.9   Total Protein 6.0 - 8.5 g/dL  7.5  7.2   Total Bilirubin 0.0 - 1.2 mg/dL  0.3  0.6   Alkaline Phos 44 - 121 IU/L  72  42   AST 0 - 40 IU/L  30  15   ALT 0 - 32 IU/L  12  12     Lipid Panel     Component Value Date/Time   CHOL 166 09/15/2021 1154   TRIG 94 09/15/2021 1154   HDL 74 09/15/2021 1154   CHOLHDL 2.2 09/15/2021 1154   CHOLHDL 3 11/03/2016 1017   VLDL 23.4 11/03/2016 1017   LDLCALC 75 09/15/2021 1154   LDLDIRECT 142.1 05/26/2012 0945    CBC    Component Value Date/Time   WBC 7.0 02/16/2022 1129   WBC 5.4 05/12/2021 0915   RBC 3.63 (L) 02/16/2022 1129   RBC 3.43 (L) 05/12/2021 0915   HGB 10.5 (L) 02/16/2022 1129   HCT 33.3 (L) 02/16/2022 1129   PLT 207 02/16/2022 1129   MCV 92 02/16/2022 1129   MCH 28.9 02/16/2022 1129   MCHC 31.5 02/16/2022 1129   MCHC 32.6 05/12/2021 0915   RDW 13.0 02/16/2022 1129   LYMPHSABS 1.9 02/16/2022 1129   MONOABS 0.3 05/04/2017 1008   EOSABS 0.2 02/16/2022 1129   BASOSABS  0.1 02/16/2022 1129    Lab Results  Component Value Date   HGBA1C 6.1 12/22/2022    Assessment & Plan:      Type 2 Diabetes Mellitus A1c improved from 6.3 to 6.1. Patient admits to inconsistent home glucose monitoring. -Continue Metformin and Actos as prescribed. -Encourage consistent home glucose monitoring. -Counseled on Diabetic diet, my plate method, 474 minutes of moderate intensity exercise/week Blood sugar logs with fasting goals of 80-120 mg/dl, random of less than 259 and in the event of sugars less than 60 mg/dl or greater than 563 mg/dl encouraged to notify the clinic. Advised on the need for annual eye exams, annual foot exams, Pneumonia vaccine.   Hypertension Blood pressure elevated at visit, patient had not taken medication prior to appointment. -Continue Benazepril/Hydrochlorothiazide as prescribed. -Recheck blood pressure at next visit.  Chronic left Knee Pain Patient reports chronic left knee pain, previously had arthroscopic surgery. Gabapentin not providing relief. -She also does have underlying PVD which could explain complaints of feeling a cold sensation in her legs -Prescribe Voltaren gel -Referral to Orthopedics for evaluation and potential knee replacement.  Dementia Patient on Donepezil for memory issues. -Continue Donepezil as prescribed.  Osteoporosis She remains on Actonel  General Health Maintenance -Declined flu shot. -Scheduled eye exam with Dr. Burgess Estelle. -Order labs for kidney and liver function.          Meds ordered this encounter  Medications   atorvastatin (LIPITOR) 40 MG tablet    Sig: Take 1 tablet (40 mg total) by mouth daily.    Dispense:  90 tablet    Refill:  1   benazepril-hydrochlorthiazide (LOTENSIN HCT) 20-12.5 MG tablet    Sig: Take 1 tablet by mouth daily.    Dispense:  90 tablet    Refill:  1   diclofenac Sodium (VOLTAREN) 1 % GEL    Sig: Apply 4 g topically 2 (two) times daily as needed.    Dispense:  100 g     Refill:  1   gabapentin (NEURONTIN) 100 MG capsule    Sig: Take 1 capsule (100 mg total) by mouth 3 (three) times daily.    Dispense:  180 capsule    Refill:  1   metFORMIN (GLUCOPHAGE) 1000 MG tablet    Sig: Take 1 tablet (1,000 mg total) by mouth 2 (two) times daily with a meal.    Dispense:  180 tablet    Refill:  1   pioglitazone (ACTOS) 15 MG tablet    Sig:  Take 1 tablet (15 mg total) by mouth daily.    Dispense:  90 tablet    Refill:  1   risedronate (ACTONEL) 35 MG tablet    Sig: Take 1 tablet (35 mg total) by mouth every 7 (seven) days. with water on empty stomach, nothing by mouth or lie down for next 30 minutes.    Dispense:  12 tablet    Refill:  1    Follow-up: Return in about 6 months (around 06/22/2023) for Chronic medical conditions.       Hoy Register, MD, FAAFP. [facility] [place], [place] [phone]   12/22/2022, 11:44 AM

## 2022-12-22 NOTE — Patient Instructions (Signed)
Chronic Knee Pain, Adult Knee pain that lasts longer than 3 months is called chronic knee pain. You may have pain in one or both knees. Symptoms of chronic knee pain may also include swelling and stiffness. Many conditions can cause chronic knee pain. The most common cause is wear and tear of your knee joint as you get older. Other possible causes include: A disease that causes inflammation of the knee, such as rheumatoid arthritis. This usually affects both knees. A condition called inflammatory arthritis, such as gout. An injury to the knee that causes arthritis. An injury to the knee that damages the ligaments. Ligaments are tissues that connect bones to each other. Runner's knee or pain behind the kneecap. Treatment for chronic knee pain depends on the cause. The main treatments for chronic knee pain are: Doing exercises to help your knee move better and get stronger, called physical therapy. Losing weight if you are overweight. This condition may also be treated with medicines, injections, a knee sleeve or brace, and by using crutches. You health care provider may also recommend rest, ice, pressure (compression), and elevation, also called RICE therapy. Follow these instructions at home: If you have a knee sleeve or brace that can be taken off:  Wear the knee sleeve or brace as told by your provider. Take it off only if your provider says that you can. Check the skin around it every day. Tell your provider if you see problems. Loosen the knee sleeve or brace if your toes tingle, are numb, or turn cold and blue. Keep the knee sleeve or brace clean and dry. Bathing If the knee sleeve or brace is not waterproof: Do not let it get wet. Cover it when you take a bath or shower. Use a cover that does not let any water in. Managing pain, stiffness, and swelling     If told, put heat on the area. Do this as often as told. Use the heat source that your provider recommends, such as a moist  heat pack or a heating pad. If you have a knee sleeve or brace that you can take off, remove it as told. Place a towel between your skin and the heat source. Leave the heat on for 20-30 minutes. If told, put ice on the area. If you have a knee sleeve or brace that you can take off, remove it as told. Put ice in a plastic bag. Place a towel between your skin and the bag. Leave the ice on for 20 minutes, 2-3 times a day. If your skin turns bright red, remove the ice or heat right away to prevent skin damage. The risk of damage is higher if you cannot feel pain, heat, or cold. Move your toes often to reduce stiffness and swelling. Raise the injured area above the level of your heart while you are sitting or lying down. Use a pillow to support your foot as needed. Activity Avoid activities where both feet leave the ground at the same time. Avoid running, jumping rope, and doing jumping jacks. Follow the exercise plan that your provider made for you. Your provider may suggest that you: Avoid activities that make knee pain worse. This may mean that you need to change your exercise routines, sports, or job duties. Wear shoes with cushioned soles. Avoid sports that require running and sudden changes in direction. Do physical therapy. Physical therapy helps your knee move better and get stronger. Exercise as told. Do exercises that increase balance and strength, such as  tai chi and yoga. Do not stand or walk on your injured knee until you're told it's okay. Use crutches as told. Return to normal activities when you're told. Ask what things are safe for you to do. General instructions Take your medicines only as told by your provider. If you are overweight, work with your provider and an expert in healthy eating called a dietitian to set goals to lose weight. Losing even a little weight can reduce knee pain. Being overweight can make your knee hurt more. Do not smoke, vape, or use products with  nicotine or tobacco in them. If you need help quitting, talk with your provider. Keep all follow-up visits. Your provider will monitor your pain and try other treatments if needed. Contact a health care provider if: You have knee pain that is not getting better or gets worse. You are not able to do your exercises due to knee pain. Get help right away if: Your knee swells and the swelling becomes worse. You cannot move your knee. You have severe knee pain. This information is not intended to replace advice given to you by your health care provider. Make sure you discuss any questions you have with your health care provider. Document Revised: 04/19/2022 Document Reviewed: 04/19/2022 Elsevier Patient Education  2024 [employer].

## 2022-12-23 LAB — CMP14+EGFR
ALT: 10 [IU]/L (ref 0–32)
AST: 14 [IU]/L (ref 0–40)
Albumin: 4.6 g/dL (ref 3.7–4.7)
Alkaline Phosphatase: 61 [IU]/L (ref 44–121)
BUN/Creatinine Ratio: 24 (ref 12–28)
BUN: 26 mg/dL (ref 8–27)
Bilirubin Total: 0.5 mg/dL (ref 0.0–1.2)
CO2: 24 mmol/L (ref 20–29)
Calcium: 9.8 mg/dL (ref 8.7–10.3)
Chloride: 96 mmol/L (ref 96–106)
Creatinine, Ser: 1.1 mg/dL — ABNORMAL HIGH (ref 0.57–1.00)
Globulin, Total: 2.6 g/dL (ref 1.5–4.5)
Glucose: 111 mg/dL — ABNORMAL HIGH (ref 70–99)
Potassium: 4.8 mmol/L (ref 3.5–5.2)
Sodium: 134 mmol/L (ref 134–144)
Total Protein: 7.2 g/dL (ref 6.0–8.5)
eGFR: 50 mL/min/{1.73_m2} — ABNORMAL LOW (ref >59)

## 2022-12-23 LAB — CBC WITH DIFFERENTIAL/PLATELET
Basophils Absolute: 0.1 10*3/uL (ref 0.0–0.2)
Basos: 1 %
EOS (ABSOLUTE): 0.1 10*3/uL (ref 0.0–0.4)
Eos: 2 %
Hematocrit: 31.8 % — ABNORMAL LOW (ref 34.0–46.6)
Hemoglobin: 10.7 g/dL — ABNORMAL LOW (ref 11.1–15.9)
Immature Grans (Abs): 0 10*3/uL (ref 0.0–0.1)
Immature Granulocytes: 0 %
Lymphocytes Absolute: 1.8 10*3/uL (ref 0.7–3.1)
Lymphs: 32 %
MCH: 30.5 pg (ref 26.6–33.0)
MCHC: 33.6 g/dL (ref 31.5–35.7)
MCV: 91 fL (ref 79–97)
Monocytes Absolute: 0.4 10*3/uL (ref 0.1–0.9)
Monocytes: 7 %
Neutrophils Absolute: 3.3 10*3/uL (ref 1.4–7.0)
Neutrophils: 58 %
Platelets: 253 10*3/uL (ref 150–450)
RBC: 3.51 x10E6/uL — ABNORMAL LOW (ref 3.77–5.28)
RDW: 13 % (ref 11.7–15.4)
WBC: 5.8 10*3/uL (ref 3.4–10.8)

## 2023-01-03 ENCOUNTER — Ambulatory Visit: Payer: PPO | Admitting: Orthopedic Surgery

## 2023-01-03 ENCOUNTER — Other Ambulatory Visit (INDEPENDENT_AMBULATORY_CARE_PROVIDER_SITE_OTHER): Payer: Self-pay

## 2023-01-03 ENCOUNTER — Encounter: Payer: Self-pay | Admitting: Orthopedic Surgery

## 2023-01-03 DIAGNOSIS — M1712 Unilateral primary osteoarthritis, left knee: Secondary | ICD-10-CM

## 2023-01-03 MED ORDER — METHYLPREDNISOLONE ACETATE 40 MG/ML IJ SUSP
40.0000 mg | INTRAMUSCULAR | Status: AC | PRN
  Administered 2023-01-03: 40 mg via INTRA_ARTICULAR

## 2023-01-03 MED ORDER — LIDOCAINE HCL (PF) 1 % IJ SOLN
5.0000 mL | INTRAMUSCULAR | Status: AC | PRN
  Administered 2023-01-03: 5 mL

## 2023-01-03 NOTE — Progress Notes (Signed)
Office Visit Note   Patient: Briana Patton           Date of Birth: 11/27/1940           MRN: [id number] Visit Date: 01/03/2023              Requested by: Hoy Register, MD [address] [place],  [place] [address] PCP: Storm Frisk, MD  Chief Complaint  Patient presents with   Left Knee - Pain      HPI: Patient is an 82 year old woman who presents with chronic osteoarthritis of her left knee.  She is s/p left knee arthroscopy and has taken Neurontin in the past without relief.  Patient is on medication for dementia.  Assessment & Plan: Visit Diagnoses:  1. Unilateral primary osteoarthritis, left knee     Plan: Left knee was injected she tolerated this well reevaluate in 4 weeks.  Follow-Up Instructions: Return in about 4 weeks (around 01/31/2023).   Ortho Exam  Patient is alert, oriented, no adenopathy, well-dressed, normal affect, normal respiratory effort. Examination patient has crepitation range of motion of both knees tenderness to palpation over the medial lateral joint line.  Radiograph shows end-stage tricompartmental arthritic changes.  Imaging: XR Knee 1-2 Views Left  Result Date: 01/03/2023 2 view radiographs of the left knee shows advanced tricompartmental arthritis with subcondylar cysts subcondylar sclerosis and periarticular bony spurs.  No images are attached to the encounter.  Labs: Lab Results  Component Value Date   HGBA1C 6.1 12/22/2022   HGBA1C 6.3 02/16/2022   HGBA1C 6.2 09/15/2021     Lab Results  Component Value Date   ALBUMIN 4.6 12/22/2022   ALBUMIN 4.5 09/15/2021   ALBUMIN 4.3 11/03/2016    No results found for: "MG" No results found for: "VD25OH"  No results found for: "PREALBUMIN"    Latest Ref Rng & Units 12/22/2022   11:29 AM 02/16/2022   11:29 AM 05/12/2021    9:15 AM  CBC EXTENDED  WBC 3.4 - 10.8 x10E3/uL 5.8  7.0  5.4   RBC 3.77 - 5.28 x10E6/uL 3.51  3.63  3.43   Hemoglobin 11.1 - 15.9 g/dL  19.1  47.8  29.5   HCT 34.0 - 46.6 % 31.8  33.3  31.8   Platelets 150 - 450 x10E3/uL 253  207  189.0   NEUT# 1.4 - 7.0 x10E3/uL 3.3  4.3    Lymph# 0.7 - 3.1 x10E3/uL 1.8  1.9       There is no height or weight on file to calculate BMI.  Orders:  Orders Placed This Encounter  Procedures   XR Knee 1-2 Views Left   No orders of the defined types were placed in this encounter.    Procedures: Large Joint Inj: L knee on 01/03/2023 1:39 PM Indications: pain and diagnostic evaluation Details: 22 G 1.5 in needle, anteromedial approach  Arthrogram: No  Medications: 5 mL lidocaine (PF) 1 %; 40 mg methylPREDNISolone acetate 40 MG/ML Outcome: tolerated well, no immediate complications Procedure, treatment alternatives, risks and benefits explained, specific risks discussed. Consent was given by the patient. Immediately prior to procedure a time out was called to verify the correct patient, procedure, equipment, support staff and site/side marked as required. Patient was prepped and draped in the usual sterile fashion.      Clinical Data: No additional findings.  ROS:  All other systems negative, except as noted in the HPI. Review of Systems  Objective: Vital Signs: There were  no vitals taken for this visit.  Specialty Comments:  No specialty comments available.  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   Chronic right shoulder pain 04/14/2016   GERD (gastroesophageal reflux disease) 06/03/2011   Short-segment Barrett's esophagus 06/03/2011   IBS (irritable bowel  syndrome) 06/03/2011   Osteoarthritis 09/04/2007   Palpitations 09/04/2007   Type II diabetes mellitus (HCC) 07/26/2006   Hyperlipidemia 07/26/2006   Essential hypertension 07/26/2006   Diverticulosis of colon 07/26/2006   S/P left knee arthroscopy 07/26/2006   Past Medical History:  Diagnosis Date   Abdominal pain    in past   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   Chronic kidney disease, stage 3a 05/05/2021   Dementia due to Alzheimer's disease 01/13/2022   Diverticulosis of colon 07/26/2006   Essential hypertension 07/26/2006   Generalized anxiety disorder 05/05/2021   GERD (gastroesophageal reflux disease)    Hyperlipidemia    IBS (irritable bowel syndrome) 06/03/2011   Internal hemorrhoids without mention of complication    Morbid obesity 05/05/2021   Osteoarthritis    Palpitations 09/04/2007   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   PVD (peripheral vascular disease) 05/05/2021   S/P left knee arthroscopy 07/26/2006   Seborrheic dermatitis 05/18/2021   Short-segment Barrett's esophagus 06/03/2011   Type II diabetes mellitus 07/26/2006    Family History  Problem Relation Age of Onset   Diabetes Mother    Diabetes Father    Stroke Father    Alcohol abuse Sister        X2   Dementia Sister    Memory loss Sister    Diabetes Brother    Epilepsy Brother    Seizures Brother        from prior head injury at 6-79 years old   Colon cancer Neg Hx    Esophageal cancer Neg Hx    Rectal cancer Neg Hx    Stomach cancer Neg Hx     Past Surgical History:  Procedure Laterality Date   ABDOMINAL HYSTERECTOMY     CESAREAN SECTION     3 times   COLONOSCOPY     KNEE ARTHROSCOPY     left knee surgery 1999   LUMBAR LAMINECTOMY     UPPER GASTROINTESTINAL ENDOSCOPY     Social History   Occupational History   Not on file  Tobacco Use   Smoking status: Former    Current packs/day: 0.00    Types: Cigarettes    Quit date: 03/08/1998    Years  since quitting: 24.8   Smokeless tobacco: Never  Vaping Use   Vaping status: Never Used  Substance and Sexual Activity   Alcohol use: No    Alcohol/week: 0.0 standard drinks of alcohol   Drug use: No   Sexual activity: Not on file

## 2023-01-18 DIAGNOSIS — H35373 Puckering of macula, bilateral: Secondary | ICD-10-CM | POA: Diagnosis not present

## 2023-01-18 DIAGNOSIS — H5201 Hypermetropia, right eye: Secondary | ICD-10-CM | POA: Diagnosis not present

## 2023-01-18 DIAGNOSIS — H524 Presbyopia: Secondary | ICD-10-CM | POA: Diagnosis not present

## 2023-01-18 DIAGNOSIS — H2511 Age-related nuclear cataract, right eye: Secondary | ICD-10-CM | POA: Diagnosis not present

## 2023-01-18 DIAGNOSIS — H25011 Cortical age-related cataract, right eye: Secondary | ICD-10-CM | POA: Diagnosis not present

## 2023-01-18 LAB — HM DIABETES EYE EXAM

## 2023-01-31 ENCOUNTER — Encounter: Payer: Self-pay | Admitting: Orthopedic Surgery

## 2023-01-31 ENCOUNTER — Ambulatory Visit: Payer: PPO | Admitting: Orthopedic Surgery

## 2023-01-31 DIAGNOSIS — M1712 Unilateral primary osteoarthritis, left knee: Secondary | ICD-10-CM

## 2023-01-31 NOTE — Progress Notes (Signed)
Office Visit Note   Patient: Briana Patton           Date of Birth: [birth year]           MRN: [id number] Visit Date: 01/31/2023              Requested by: Storm Frisk, MD [address] [place],  [place] [address] PCP: Storm Frisk, MD  Chief Complaint  Patient presents with   Left Knee - Follow-up      HPI: Patient is an 82 year old woman who presents in follow-up for osteoarthritis left knee.  Patient states the injection provided her minimal relief.  She states she is not interested in another injection.  Assessment & Plan: Visit Diagnoses:  1. Unilateral primary osteoarthritis, left knee     Plan: Recommended she could try Voltaren gel or hemp cream.  Discussed the importance of strengthening and that patient would need to strengthen her lower extremities prior to considering surgery.  Follow-Up Instructions: No follow-ups on file.   Ortho Exam  Patient is alert, oriented, no adenopathy, well-dressed, normal affect, normal respiratory effort. Examination patient ambulates with a cane with an antalgic gait with a kyphosis.  Patient cannot get from a seated position to a standing position without using her arms.  She is tender to palpation around the knee collaterals and cruciates are stable.  Imaging: No results found. No images are attached to the encounter.  Labs: Lab Results  Component Value Date   HGBA1C 6.1 12/22/2022   HGBA1C 6.3 02/16/2022   HGBA1C 6.2 09/15/2021     Lab Results  Component Value Date   ALBUMIN 4.6 12/22/2022   ALBUMIN 4.5 09/15/2021   ALBUMIN 4.3 11/03/2016    No results found for: "MG" No results found for: "VD25OH"  No results found for: "PREALBUMIN"    Latest Ref Rng & Units 12/22/2022   11:29 AM 02/16/2022   11:29 AM 05/12/2021    9:15 AM  CBC EXTENDED  WBC 3.4 - 10.8 x10E3/uL 5.8  7.0  5.4   RBC 3.77 - 5.28 x10E6/uL 3.51  3.63  3.43   Hemoglobin 11.1 - 15.9 g/dL 19.1  47.8  29.5   HCT 34.0 - 46.6  % 31.8  33.3  31.8   Platelets 150 - 450 x10E3/uL 253  207  189.0   NEUT# 1.4 - 7.0 x10E3/uL 3.3  4.3    Lymph# 0.7 - 3.1 x10E3/uL 1.8  1.9       There is no height or weight on file to calculate BMI.  Orders:  No orders of the defined types were placed in this encounter.  No orders of the defined types were placed in this encounter.    Procedures: No procedures performed  Clinical Data: No additional findings.  ROS:  All other systems negative, except as noted in the HPI. Review of Systems  Objective: Vital Signs: There were no vitals taken for this visit.  Specialty Comments:  No specialty comments available.  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   Chronic right shoulder pain 04/14/2016  GERD (gastroesophageal reflux disease) 06/03/2011   Short-segment Barrett's esophagus 06/03/2011   IBS (irritable bowel syndrome) 06/03/2011   Osteoarthritis 09/04/2007   Palpitations 09/04/2007   Type II diabetes mellitus (HCC) 07/26/2006   Hyperlipidemia 07/26/2006   Essential hypertension 07/26/2006   Diverticulosis of colon 07/26/2006   S/P left knee arthroscopy 07/26/2006   Past Medical History:  Diagnosis Date   Abdominal pain    in past   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   Chronic kidney disease, stage 3a 05/05/2021   Dementia due to Alzheimer's disease 01/13/2022   Diverticulosis of colon 07/26/2006   Essential hypertension 07/26/2006   Generalized anxiety disorder 05/05/2021   GERD (gastroesophageal reflux disease)    Hyperlipidemia     IBS (irritable bowel syndrome) 06/03/2011   Internal hemorrhoids without mention of complication    Morbid obesity 05/05/2021   Osteoarthritis    Palpitations 09/04/2007   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   PVD (peripheral vascular disease) 05/05/2021   S/P left knee arthroscopy 07/26/2006   Seborrheic dermatitis 05/18/2021   Short-segment Barrett's esophagus 06/03/2011   Type II diabetes mellitus 07/26/2006    Family History  Problem Relation Age of Onset   Diabetes Mother    Diabetes Father    Stroke Father    Alcohol abuse Sister        X2   Dementia Sister    Memory loss Sister    Diabetes Brother    Epilepsy Brother    Seizures Brother        from prior head injury at 41-49 years old   Colon cancer Neg Hx    Esophageal cancer Neg Hx    Rectal cancer Neg Hx    Stomach cancer Neg Hx     Past Surgical History:  Procedure Laterality Date   ABDOMINAL HYSTERECTOMY     CESAREAN SECTION     3 times   COLONOSCOPY     KNEE ARTHROSCOPY     left knee surgery 1999   LUMBAR LAMINECTOMY     UPPER GASTROINTESTINAL ENDOSCOPY     Social History   Occupational History   Not on file  Tobacco Use   Smoking status: Former    Current packs/day: 0.00    Types: Cigarettes    Quit date: 03/08/1998    Years since quitting: 24.9   Smokeless tobacco: Never  Vaping Use   Vaping status: Never Used  Substance and Sexual Activity   Alcohol use: No    Alcohol/week: 0.0 standard drinks of alcohol   Drug use: No   Sexual activity: Not on file

## 2023-02-15 DIAGNOSIS — M1712 Unilateral primary osteoarthritis, left knee: Secondary | ICD-10-CM | POA: Diagnosis not present

## 2023-04-20 ENCOUNTER — Other Ambulatory Visit: Payer: Self-pay | Admitting: Family Medicine

## 2023-04-20 MED ORDER — GABAPENTIN 100 MG PO CAPS: 100.0000 mg | ORAL_CAPSULE | Freq: Three times a day (TID) | ORAL | 0 refills | Status: DC

## 2023-04-20 NOTE — Telephone Encounter (Signed)
Copied from CRM [id number]. Topic: Clinical - Medication Refill >> Apr 20, 2023 11:28 AM Carlatta H wrote: Most Recent Primary Care Visit:  Provider: Hoy Register  Department: [facility]  Visit Type: OFFICE VISIT  Date: 12/22/2022  Medication: gabapentin (NEURONTIN) 100 MG capsule [[id number]]  Has the patient contacted their pharmacy? Yes (Agent: If no, request that the patient contact the pharmacy for the refill. If patient does not wish to contact the pharmacy document the reason why and proceed with request.) (Agent: If yes, when and what did the pharmacy advise?)Advised to call for a refill  Is this the correct pharmacy for this prescription? Yes If no, delete pharmacy and type the correct one.  This is the patient's preferred pharmacy:   [facility] [address] [place] - [address] [place] [address] Phone: [phone] Fax: [phone]     Has the prescription been filled recently? No  Is the patient out of the medication? Yes  Has the patient been seen for an appointment in the last year OR does the patient have an upcoming appointment? Yes  Can we respond through MyChart? No  Agent: Please be advised that Rx refills may take up to 3 business days. We ask that you follow-up with your pharmacy.

## 2023-04-20 NOTE — Telephone Encounter (Signed)
Requested Prescriptions  Pending Prescriptions Disp Refills   gabapentin (NEURONTIN) 100 MG capsule 180 capsule 1    Sig: Take 1 capsule (100 mg total) by mouth 3 (three) times daily.     Neurology: Anticonvulsants - gabapentin Failed - 04/20/2023 11:50 AM      Failed - Cr in normal range and within 360 days    Creatinine, Ser  Date Value Ref Range Status  12/22/2022 1.10 (H) 0.57 - 1.00 mg/dL Final   Creatinine,U  Date Value Ref Range Status  11/03/2016 83.9 mg/dL Final         Passed - Completed PHQ-2 or PHQ-9 in the last 360 days      Passed - Valid encounter within last 12 months    Recent Outpatient Visits           3 months ago Type 2 diabetes mellitus without complication, without long-term current use of insulin (HCC)   [facility] [facility] [place]. [facility] Hoy Register, MD   10 months ago Essential hypertension   [place] [facility] [place]. [facility] Storm Frisk, MD   11 months ago Hematuria, unspecified type   [place] [facility] - [facility] Dept Of [place]. [facility] Hoy Register, MD   1 year ago Diarrhea of presumed infectious origin   [place] [facility] Dept Of [place]. [facility] Storm Frisk, MD   1 year ago Diabetes mellitus without complication [facility])   [place] [facility] - [facility] Dept Of [place]. [facility] Storm Frisk, MD

## 2023-05-04 ENCOUNTER — Other Ambulatory Visit: Payer: Self-pay | Admitting: Critical Care Medicine

## 2023-06-14 ENCOUNTER — Other Ambulatory Visit: Payer: Self-pay | Admitting: Family Medicine

## 2023-06-14 DIAGNOSIS — E119 Type 2 diabetes mellitus without complications: Secondary | ICD-10-CM

## 2023-06-15 ENCOUNTER — Other Ambulatory Visit: Payer: Self-pay | Admitting: Family Medicine

## 2023-06-15 DIAGNOSIS — E119 Type 2 diabetes mellitus without complications: Secondary | ICD-10-CM

## 2023-06-25 ENCOUNTER — Other Ambulatory Visit: Payer: Self-pay | Admitting: Family Medicine

## 2023-06-27 NOTE — Telephone Encounter (Signed)
 Requested medications are due for refill today.  yes  Requested medications are on the active medications list.  yes  Last refill. 12/22/2022 #90 1 rf  Future visit scheduled.   yes  Notes to clinic.  Labs are expired.     Requested Prescriptions  Pending Prescriptions Disp Refills   atorvastatin  (LIPITOR) 40 MG tablet [Pharmacy Med Name: Atorvastatin  Calcium  40 MG Oral Tablet] 90 tablet 0    Sig: Take 1 tablet by mouth once daily     Cardiovascular:  Antilipid - Statins Failed - 06/27/2023 10:29 AM      Failed - Lipid Panel in normal range within the last 12 months    Cholesterol, Total  Date Value Ref Range Status  09/15/2021 166 100 - 199 mg/dL Final   LDL Chol Calc (NIH)  Date Value Ref Range Status  09/15/2021 75 0 - 99 mg/dL Final   Direct LDL  Date Value Ref Range Status  05/26/2012 142.1 mg/dL Final    Comment:    Optimal:  <100 mg/dLNear or Above Optimal:  100-129 mg/dLBorderline High:  130-159 mg/dLHigh:  160-189 mg/dLVery High:  >190 mg/dL   HDL  Date Value Ref Range Status  09/15/2021 74 >39 mg/dL Final   Triglycerides  Date Value Ref Range Status  09/15/2021 94 0 - 149 mg/dL Final         Passed - Patient is not pregnant      Passed - Valid encounter within last 12 months    Recent Outpatient Visits           6 months ago Type 2 diabetes mellitus without complication, without long-term current use of insulin (HCC)   [place] [facility] [place]. [facility] Joaquin Mulberry, MD   1 year ago Essential hypertension   [place] [facility] [place] [facility] [place]. [facility] Vernell Goldsmith, MD   1 year ago Hematuria, unspecified type   [place] [facility] - [facility] Dept Of [place]. [facility] Joaquin Mulberry, MD   1 year ago Diarrhea of presumed infectious origin   [place] [facility] - [facility] Dept Of [place]. [facility] Vernell Goldsmith, MD   1 year ago  Diabetes mellitus without complication [facility])    [facility] - A Dept Of [place]. [facility] Vernell Goldsmith, MD

## 2023-06-29 ENCOUNTER — Other Ambulatory Visit: Payer: Self-pay | Admitting: Pharmacist

## 2023-06-29 DIAGNOSIS — E119 Type 2 diabetes mellitus without complications: Secondary | ICD-10-CM

## 2023-06-29 MED ORDER — METFORMIN HCL 1000 MG PO TABS: 1000.0000 mg | ORAL_TABLET | Freq: Two times a day (BID) | ORAL | 0 refills | Status: DC

## 2023-06-29 NOTE — Progress Notes (Signed)
 Pharmacy Quality Measure Review  This patient is appearing on a report for being at risk of failing the adherence measure for cholesterol (statin), diabetes, and hypertension (ACEi/ARB) medications this calendar year.   Medication: atorvastatin   Last fill date: 06/28/2023 for 90 day supply  Medication: pioglitazone  Last fill date: 06/17/2023 for 90 day supply  Medication: metformin  Last fill date: 06/15/2023 for 30 day supply  Medication: benazepril -hydrochlorothiazide   Last fill date: 06/02/2023 for 90 day supply  Refills are up-to-date. Sent new 90-day rxn for metformin  with instructions to make PCP appointment. Reminder set for next refills.   Marene Shape, PharmD, Becky Bowels, CPP Clinical Pharmacist [facility] [phone]

## 2023-07-13 ENCOUNTER — Encounter: Payer: Self-pay | Admitting: Pharmacist

## 2023-07-13 ENCOUNTER — Other Ambulatory Visit: Payer: Self-pay | Admitting: Family Medicine

## 2023-07-13 ENCOUNTER — Other Ambulatory Visit (HOSPITAL_BASED_OUTPATIENT_CLINIC_OR_DEPARTMENT_OTHER): Payer: Self-pay | Admitting: Pharmacist

## 2023-07-13 DIAGNOSIS — I10 Essential (primary) hypertension: Secondary | ICD-10-CM

## 2023-07-13 DIAGNOSIS — E119 Type 2 diabetes mellitus without complications: Secondary | ICD-10-CM

## 2023-07-13 DIAGNOSIS — Z7984 Long term (current) use of oral hypoglycemic drugs: Secondary | ICD-10-CM

## 2023-07-13 DIAGNOSIS — E1159 Type 2 diabetes mellitus with other circulatory complications: Secondary | ICD-10-CM

## 2023-07-13 DIAGNOSIS — E785 Hyperlipidemia, unspecified: Secondary | ICD-10-CM

## 2023-07-13 MED ORDER — PIOGLITAZONE HCL 15 MG PO TABS: 15.0000 mg | ORAL_TABLET | Freq: Every day | ORAL | 0 refills | Status: DC

## 2023-07-13 NOTE — Progress Notes (Signed)
 Pharmacy Quality Measure Review  This patient is appearing on a report for being at risk of failing the adherence measure for cholesterol (statin), diabetes, and hypertension (ACEi/ARB) medications this calendar year.   Medication: atorvastatin   Last fill date: 06/28/2023 for 90 day supply  Medication: pioglitazone  Last fill date: 07/13/2023 for 30 day supply  Medication: metformin  Last fill date: 07/13/2023 for 90 day supply  Medication: benazepril -hydrochlorothiazide   Last fill date: 06/02/2023 for 90 day supply  Contacted patient's pharmacy to fill a 90-day rxn for metformin . Authorized 30-day of pioglitazone  with instructions to make PCP appointment. Call placed to her daughter and she will call the front to make an appointment. Reminder set for next refills.   Marene Shape, PharmD, Becky Bowels, CPP Clinical Pharmacist [facility] [phone]

## 2023-07-19 DIAGNOSIS — H35373 Puckering of macula, bilateral: Secondary | ICD-10-CM | POA: Diagnosis not present

## 2023-07-19 DIAGNOSIS — H524 Presbyopia: Secondary | ICD-10-CM | POA: Diagnosis not present

## 2023-07-19 DIAGNOSIS — H25011 Cortical age-related cataract, right eye: Secondary | ICD-10-CM | POA: Diagnosis not present

## 2023-07-19 DIAGNOSIS — H5201 Hypermetropia, right eye: Secondary | ICD-10-CM | POA: Diagnosis not present

## 2023-07-19 DIAGNOSIS — H2511 Age-related nuclear cataract, right eye: Secondary | ICD-10-CM | POA: Diagnosis not present

## 2023-07-19 DIAGNOSIS — E119 Type 2 diabetes mellitus without complications: Secondary | ICD-10-CM | POA: Diagnosis not present

## 2023-07-19 LAB — HM DIABETES EYE EXAM

## 2023-08-10 ENCOUNTER — Other Ambulatory Visit: Payer: Self-pay | Admitting: Pharmacist

## 2023-08-10 NOTE — Progress Notes (Signed)
 Pharmacy Quality Measure Review  This patient is appearing on a report for being at risk of failing the adherence measure for cholesterol (statin), diabetes, and hypertension (ACEi/ARB) medications this calendar year.   Medication: atorvastatin   Last fill date: 06/28/2023 for 90 day supply  Medication: pioglitazone  Last fill date: 08/05/2023 for 30 day supply  Medication: metformin  Last fill date: 07/13/2023 for 90 day supply  Medication: benazepril -hydrochlorothiazide   Last fill date: 06/02/2023 for 90 day supply  Refills are up to date and appt with Dr. Newlin has been scheduled for 09/05/2023.   Marene Shape, PharmD, Becky Bowels, CPP Clinical Pharmacist [facility] [phone]

## 2023-08-22 DIAGNOSIS — H25811 Combined forms of age-related cataract, right eye: Secondary | ICD-10-CM | POA: Diagnosis not present

## 2023-08-22 DIAGNOSIS — Z961 Presence of intraocular lens: Secondary | ICD-10-CM | POA: Diagnosis not present

## 2023-08-22 DIAGNOSIS — H25011 Cortical age-related cataract, right eye: Secondary | ICD-10-CM | POA: Diagnosis not present

## 2023-08-22 DIAGNOSIS — H2511 Age-related nuclear cataract, right eye: Secondary | ICD-10-CM | POA: Diagnosis not present

## 2023-08-26 ENCOUNTER — Other Ambulatory Visit: Payer: Self-pay | Admitting: Family Medicine

## 2023-08-26 DIAGNOSIS — E1159 Type 2 diabetes mellitus with other circulatory complications: Secondary | ICD-10-CM

## 2023-09-05 ENCOUNTER — Encounter: Payer: Self-pay | Admitting: Family Medicine

## 2023-09-05 ENCOUNTER — Ambulatory Visit: Attending: Family Medicine | Admitting: Family Medicine

## 2023-09-05 VITALS — BP 144/77 | HR 61 | Ht 59.0 in | Wt 162.6 lb

## 2023-09-05 DIAGNOSIS — Z7984 Long term (current) use of oral hypoglycemic drugs: Secondary | ICD-10-CM

## 2023-09-05 DIAGNOSIS — G309 Alzheimer's disease, unspecified: Secondary | ICD-10-CM

## 2023-09-05 DIAGNOSIS — F0280 Dementia in other diseases classified elsewhere without behavioral disturbance: Secondary | ICD-10-CM

## 2023-09-05 DIAGNOSIS — M810 Age-related osteoporosis without current pathological fracture: Secondary | ICD-10-CM

## 2023-09-05 DIAGNOSIS — I10 Essential (primary) hypertension: Secondary | ICD-10-CM

## 2023-09-05 DIAGNOSIS — E119 Type 2 diabetes mellitus without complications: Secondary | ICD-10-CM | POA: Diagnosis not present

## 2023-09-05 DIAGNOSIS — I152 Hypertension secondary to endocrine disorders: Secondary | ICD-10-CM

## 2023-09-05 LAB — POCT GLYCOSYLATED HEMOGLOBIN (HGB A1C): HbA1c, POC (controlled diabetic range): 6.1 % (ref 0.0–7.0)

## 2023-09-05 MED ORDER — GABAPENTIN 100 MG PO CAPS: 100.0000 mg | ORAL_CAPSULE | Freq: Three times a day (TID) | ORAL | 1 refills | Status: DC

## 2023-09-05 MED ORDER — PIOGLITAZONE HCL 15 MG PO TABS: 15.0000 mg | ORAL_TABLET | Freq: Every day | ORAL | 1 refills | Status: DC

## 2023-09-05 MED ORDER — METFORMIN HCL 1000 MG PO TABS: 1000.0000 mg | ORAL_TABLET | Freq: Two times a day (BID) | ORAL | 1 refills | Status: AC

## 2023-09-05 MED ORDER — RISEDRONATE SODIUM 35 MG PO TABS
35.0000 mg | ORAL_TABLET | ORAL | 1 refills | Status: AC
Start: 2023-09-05 — End: ?

## 2023-09-05 MED ORDER — BENAZEPRIL-HYDROCHLOROTHIAZIDE 20-12.5 MG PO TABS: 1.0000 | ORAL_TABLET | Freq: Every day | ORAL | 1 refills | Status: AC

## 2023-09-05 NOTE — Patient Instructions (Signed)
 VISIT SUMMARY:  Briana Patton, an 83 year old female with hypertension and dementia, came in for a routine follow-up visit. She reported decreased physical activity and memory difficulties, which affect her daily activities and medication adherence. Her blood pressure was slightly elevated, and her diabetes is well controlled.  YOUR PLAN:  -HYPERTENSION: Hypertension, or high blood pressure, can lead to serious health problems if not managed properly. Your blood pressure was slightly elevated today, likely due to high sodium intake and missed medication. We will recheck your blood pressure during the visit and ensure you have refills for your medications, benazepril  and hydrochlorothiazide .  -DEMENTIA DUE TO ALZHEIMER'S DISEASE: Dementia due to Alzheimer's disease affects memory and daily activities. You are currently taking donepezil  to support your memory. Please continue your follow-up with the neurologist and engage in cognitive activities like crossword puzzles and board games to help maintain your cognitive function.  -TYPE 2 DIABETES MELLITUS: Type 2 Diabetes Mellitus is a condition that affects how your body processes blood sugar. Your diabetes is well controlled with an A1c of 6.1%. Please continue with your current diabetes management plan.  -GENERAL HEALTH MAINTENANCE: It's important to maintain overall health through regular physical activity and routine check-ups. We encourage you to engage in more physical activity beyond walking in your yard. We will also perform blood work to check your kidney and liver function.  INSTRUCTIONS:  Please ensure you take your blood pressure medications as prescribed. Follow up with your neurologist for dementia management. Continue with your current diabetes management plan. Engage in more physical activities and come in for your blood work to check kidney and liver function.

## 2023-09-05 NOTE — Progress Notes (Signed)
 Subjective:  Patient ID: Briana Patton, female    DOB: 1940-06-14  Age: 83 y.o. MRN: [id number]  CC: Medical Management of Chronic Issues     Discussed the use of AI scribe software for clinical note transcription with the patient, who gave verbal consent to proceed.  History of Present Illness Briana Patton is an 83 year old female with a history of PVD, IBS, type 2 diabetes mellitus, dementia. who presents for a routine follow-up visit.  She experiences decreased physical activity, primarily walking in her yard. Her pace slow, and she describes feeling 'short-legged,' affecting her walking pace hence she does not go walking because she is unable to keep up with her husband. She spends significant time on the couch watching TV.  She is uncertain if she took her blood pressure medications, benazepril  and hydrochlorothiazide , today due to memory issues. Her husband assists with medication management by organizing them in a plastic container.  She experiences memory difficulties, often requiring reminders from her husband. Her mother had similar memory issues.  She remains on Aricept  which is prescribed by neurology.   No numbness in her feet or hands, dizziness, lightheadedness, constipation, or swelling in her feet. She tries to eat healthily and sits up for a while before moving to prevent dizziness.  A1c is controlled at 6.1 and she is doing well on metformin  and Actos . For osteoporosis she remains on Actonel  She is currently on gabapentin  which she has been receiving from her previous PCP but is unsure of its indication.   Past Medical History:  Diagnosis Date   Abdominal pain    in past   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   Chronic kidney disease, stage 3a 05/05/2021   Dementia due to Alzheimer's disease 01/13/2022   Diverticulosis of colon 07/26/2006   Essential hypertension 07/26/2006   Generalized anxiety disorder 05/05/2021   GERD (gastroesophageal reflux  disease)    Hyperlipidemia    IBS (irritable bowel syndrome) 06/03/2011   Internal hemorrhoids without mention of complication    Morbid obesity 05/05/2021   Osteoarthritis    Palpitations 09/04/2007   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   PVD (peripheral vascular disease) 05/05/2021   S/P left knee arthroscopy 07/26/2006   Seborrheic dermatitis 05/18/2021   Short-segment Barrett's esophagus 06/03/2011   Type II diabetes mellitus 07/26/2006    Past Surgical History:  Procedure Laterality Date   ABDOMINAL HYSTERECTOMY     CESAREAN SECTION     3 times   COLONOSCOPY     KNEE ARTHROSCOPY     left knee surgery 1999   LUMBAR LAMINECTOMY     UPPER GASTROINTESTINAL ENDOSCOPY      Family History  Problem Relation Age of Onset   Diabetes Mother    Diabetes Father    Stroke Father    Alcohol abuse Sister        X2   Dementia Sister    Memory loss Sister    Diabetes Brother    Epilepsy Brother    Seizures Brother        from prior head injury at 22-87 years old   Colon cancer Neg Hx    Esophageal cancer Neg Hx    Rectal cancer Neg Hx    Stomach cancer Neg Hx     Social History   Socioeconomic History   Marital status: Married    Spouse name: Not on file   Number of children: 3   Years of education: 54  Highest education level: GED or equivalent  Occupational History   Not on file  Tobacco Use   Smoking status: Former    Current packs/day: 0.00    Types: Cigarettes    Quit date: 03/08/1998    Years since quitting: 25.5   Smokeless tobacco: Never  Vaping Use   Vaping status: Never Used  Substance and Sexual Activity   Alcohol use: No    Alcohol/week: 0.0 standard drinks of alcohol   Drug use: No   Sexual activity: Not on file  Other Topics Concern   Not on file  Social History Narrative   Father age 2 complications of cardiac and cerebrovascular disease   Right handed   One story home   No caffeine   Social Drivers of Manufacturing engineer Strain: Low Risk  (08/24/2022)   Overall Financial Resource Strain (CARDIA)    Difficulty of Paying Living Expenses: Not hard at all  Food Insecurity: No Food Insecurity (08/24/2022)   Hunger Vital Sign    Worried About Running Out of Food in the Last Year: Never true    Ran Out of Food in the Last Year: Never true  Transportation Needs: No Transportation Needs (08/24/2022)   PRAPARE - Administrator, Civil Service (Medical): No    Lack of Transportation (Non-Medical): No  Physical Activity: Insufficiently Active (08/24/2022)   Exercise Vital Sign    Days of Exercise per Week: 3 days    Minutes of Exercise per Session: 30 min  Stress: No Stress Concern Present (08/24/2022)   [employer] of Occupational Health - Occupational Stress Questionnaire    Feeling of Stress : Not at all  Social Connections: Moderately Integrated (08/24/2022)   Social Connection and Isolation Panel    Frequency of Communication with Friends and Family: More than three times a week    Frequency of Social Gatherings with Friends and Family: Three times a week    Attends Religious Services: More than 4 times per year    Active Member of Clubs or Organizations: No    Attends Banker Meetings: Never    Marital Status: Married    Allergies  Allergen Reactions   Penicillins     WHELTS    Outpatient Medications Prior to Visit  Medication Sig Dispense Refill   albuterol  (VENTOLIN  HFA) 108 (90 Base) MCG/ACT inhaler Inhale 2 puffs into the lungs every 6 (six) hours as needed for wheezing or shortness of breath. 1 each 0   atorvastatin  (LIPITOR) 40 MG tablet TAKE 1 TABLET BY MOUTH ONCE DAILY . APPOINTMENT REQUIRED FOR FUTURE REFILLS 90 tablet 0   Blood Glucose Monitoring Suppl (FREESTYLE FREEDOM LITE) W/DEVICE KIT As directed , dx code 250.00 1 each 0   diclofenac  Sodium (VOLTAREN ) 1 % GEL Apply 4 g topically 2 (two) times daily as needed. 100 g 1   donepezil  (ARICEPT )  10 MG tablet TAKE 1/2 TABLET BY MOUTH FOR 2 WEEKS, THEN INCREASE TO 1 TABLET DAILY 30 tablet 0   glucose blood (FREESTYLE LITE) test strip Test once daily. 100 each 3   Lancets (FREESTYLE) lancets Test once daily. 100 each 5   Multiple Vitamins-Calcium  (ONE-A-DAY WOMENS FORMULA PO) Take 1 tablet by mouth daily.     Turmeric 500 MG CAPS See admin instructions.     benazepril -hydrochlorthiazide (LOTENSIN  HCT) 20-12.5 MG tablet Take 1 tablet by mouth once daily 90 tablet 0   gabapentin  (NEURONTIN ) 100 MG capsule Take 1 capsule (100  mg total) by mouth 3 (three) times daily. 270 capsule 0   metFORMIN  (GLUCOPHAGE ) 1000 MG tablet Take 1 tablet (1,000 mg total) by mouth 2 (two) times daily with a meal. Please schedule PCP visit for additional refills. 180 tablet 0   pioglitazone  (ACTOS ) 15 MG tablet TAKE 1 TABLET BY MOUTH ONCE DAILY . APPOINTMENT REQUIRED FOR FUTURE REFILLS 30 tablet 0   pioglitazone  (ACTOS ) 15 MG tablet Take 1 tablet (15 mg total) by mouth daily. Please make PCP appointment for more refills. 30 tablet 0   risedronate  (ACTONEL ) 35 MG tablet TAKE 1 TABLET BY MOUTH EVERY 7 DAYS. TAKE WITH WATER ON AN EMPTY STOMACH DO NOT EAT OR LIE DOWN FOR 30 MINUTES 12 tablet 0   No facility-administered medications prior to visit.     ROS Review of Systems  Constitutional:  Negative for activity change and appetite change.  HENT:  Negative for sinus pressure and sore throat.   Respiratory:  Negative for chest tightness, shortness of breath and wheezing.   Cardiovascular:  Negative for chest pain and palpitations.  Gastrointestinal:  Negative for abdominal distention, abdominal pain and constipation.  Genitourinary: Negative.   Musculoskeletal: Negative.   Psychiatric/Behavioral:  Negative for behavioral problems and dysphoric mood.     Objective:  BP (!) 144/77   Pulse 61   Ht 4' 11 (1.499 m)   Wt 162 lb 9.6 oz (73.8 kg)   SpO2 98%   BMI 32.84 kg/m      09/05/2023    4:00 PM 09/05/2023     3:30 PM 12/22/2022   10:44 AM  BP/Weight  Systolic BP 144 147 150  Diastolic BP 77 77 65  Wt. (Lbs)  162.6 167.8  BMI  32.84 kg/m2 33.89 kg/m2      Physical Exam Constitutional:      Appearance: She is well-developed.   Cardiovascular:     Rate and Rhythm: Normal rate.     Heart sounds: Normal heart sounds. No murmur heard. Pulmonary:     Effort: Pulmonary effort is normal.     Breath sounds: Normal breath sounds. No wheezing or rales.  Chest:     Chest wall: No tenderness.  Abdominal:     General: Bowel sounds are normal. There is no distension.     Palpations: Abdomen is soft. There is no mass.     Tenderness: There is no abdominal tenderness.   Musculoskeletal:        General: Normal range of motion.     Right lower leg: No edema.     Left lower leg: No edema.   Neurological:     Mental Status: She is alert and oriented to person, place, and time.   Psychiatric:        Mood and Affect: Mood normal.        Latest Ref Rng & Units 12/22/2022   11:29 AM 02/16/2022   11:29 AM 09/15/2021   11:54 AM  CMP  Glucose 70 - 99 mg/dL 888  890  883   BUN 8 - 27 mg/dL 26  24  28    Creatinine 0.57 - 1.00 mg/dL 8.89  9.02  8.87   Sodium 134 - 144 mmol/L 134  135  140   Potassium 3.5 - 5.2 mmol/L 4.8  4.5  4.3   Chloride 96 - 106 mmol/L 96  101  100   CO2 20 - 29 mmol/L 24  20  19    Calcium  8.7 - 10.3 mg/dL 9.8  9.2  9.9   Total Protein 6.0 - 8.5 g/dL 7.2   7.5   Total Bilirubin 0.0 - 1.2 mg/dL 0.5   0.3   Alkaline Phos 44 - 121 IU/L 61   72   AST 0 - 40 IU/L 14   30   ALT 0 - 32 IU/L 10   12     Lipid Panel     Component Value Date/Time   CHOL 166 09/15/2021 1154   TRIG 94 09/15/2021 1154   HDL 74 09/15/2021 1154   CHOLHDL 2.2 09/15/2021 1154   CHOLHDL 3 11/03/2016 1017   VLDL 23.4 11/03/2016 1017   LDLCALC 75 09/15/2021 1154   LDLDIRECT 142.1 05/26/2012 0945    CBC    Component Value Date/Time   WBC 5.8 12/22/2022 1129   WBC 5.4 05/12/2021 0915    RBC 3.51 (L) 12/22/2022 1129   RBC 3.43 (L) 05/12/2021 0915   HGB 10.7 (L) 12/22/2022 1129   HCT 31.8 (L) 12/22/2022 1129   PLT 253 12/22/2022 1129   MCV 91 12/22/2022 1129   MCH 30.5 12/22/2022 1129   MCHC 33.6 12/22/2022 1129   MCHC 32.6 05/12/2021 0915   RDW 13.0 12/22/2022 1129   LYMPHSABS 1.8 12/22/2022 1129   MONOABS 0.3 05/04/2017 1008   EOSABS 0.1 12/22/2022 1129   BASOSABS 0.1 12/22/2022 1129    Lab Results  Component Value Date   HGBA1C 6.1 09/05/2023    Lab Results  Component Value Date   HGBA1C 6.1 09/05/2023   HGBA1C 6.1 12/22/2022   HGBA1C 6.3 02/16/2022      1. Type 2 diabetes mellitus without complication, without long-term current use of insulin (HCC) (Primary) Controlled with A1c of 6.1 Continue current regimen Counseled on Diabetic diet, my plate method, 849 minutes of moderate intensity exercise/week Blood sugar logs with fasting goals of 80-120 mg/dl, random of less than 819 and in the event of sugars less than 60 mg/dl or greater than 599 mg/dl encouraged to notify the clinic. Advised on the need for annual eye exams, annual foot exams, Pneumonia vaccine. - POCT glycosylated hemoglobin (Hb A1C) - Microalbumin / creatinine urine ratio - metFORMIN  (GLUCOPHAGE ) 1000 MG tablet; Take 1 tablet (1,000 mg total) by mouth 2 (two) times daily with a meal.  Dispense: 180 tablet; Refill: 1 - pioglitazone  (ACTOS ) 15 MG tablet; Take 1 tablet (15 mg total) by mouth daily.  Dispense: 90 tablet; Refill: 1 - [id number]+EGFR  2. Hypertension associated with diabetes (HCC) Slightly elevated Will make no regimen changes to prevent hypotension given age Counseled on blood pressure goal of less than 130/80, low-sodium, DASH diet, medication compliance, 150 minutes of moderate intensity exercise per week. Discussed medication compliance, adverse effects. - benazepril -hydrochlorthiazide (LOTENSIN  HCT) 20-12.5 MG tablet; Take 1 tablet by mouth daily.  Dispense: 90 tablet; Refill:  1  3. Osteoporosis, unspecified osteoporosis type, unspecified pathological fracture presence Stable - risedronate  (ACTONEL ) 35 MG tablet; Take 1 tablet (35 mg total) by mouth every 7 (seven) days. with water on empty stomach, nothing by mouth or lie down for next 30 minutes.  Dispense: 12 tablet; Refill: 1   4.  Dementia due to Alzheimer's disease Currently on Aricept  per neurology Advised to engage in board games and activities to stimulate brain activity  Meds ordered this encounter  Medications   benazepril -hydrochlorthiazide (LOTENSIN  HCT) 20-12.5 MG tablet    Sig: Take 1 tablet by mouth daily.    Dispense:  90 tablet    Refill:  1   gabapentin  (NEURONTIN ) 100 MG capsule    Sig: Take 1 capsule (100 mg total) by mouth 3 (three) times daily.    Dispense:  270 capsule    Refill:  1   metFORMIN  (GLUCOPHAGE ) 1000 MG tablet    Sig: Take 1 tablet (1,000 mg total) by mouth 2 (two) times daily with a meal.    Dispense:  180 tablet    Refill:  1   pioglitazone  (ACTOS ) 15 MG tablet    Sig: Take 1 tablet (15 mg total) by mouth daily.    Dispense:  90 tablet    Refill:  1   risedronate  (ACTONEL ) 35 MG tablet    Sig: Take 1 tablet (35 mg total) by mouth every 7 (seven) days. with water on empty stomach, nothing by mouth or lie down for next 30 minutes.    Dispense:  12 tablet    Refill:  1    Follow-up: Return in about 6 months (around 03/06/2024) for Chronic medical conditions.       Corrina Sabin, MD, FAAFP. [facility] [place], [place] [phone]   09/05/2023, 5:12 PM

## 2023-09-07 ENCOUNTER — Ambulatory Visit: Payer: Self-pay | Admitting: Family Medicine

## 2023-09-07 ENCOUNTER — Other Ambulatory Visit (HOSPITAL_BASED_OUTPATIENT_CLINIC_OR_DEPARTMENT_OTHER): Admitting: Pharmacist

## 2023-09-07 DIAGNOSIS — Z7984 Long term (current) use of oral hypoglycemic drugs: Secondary | ICD-10-CM

## 2023-09-07 DIAGNOSIS — I10 Essential (primary) hypertension: Secondary | ICD-10-CM

## 2023-09-07 DIAGNOSIS — E785 Hyperlipidemia, unspecified: Secondary | ICD-10-CM

## 2023-09-07 DIAGNOSIS — E119 Type 2 diabetes mellitus without complications: Secondary | ICD-10-CM

## 2023-09-07 LAB — MICROALBUMIN / CREATININE URINE RATIO
Creatinine, Urine: 179.2 mg/dL
Microalb/Creat Ratio: 6 mg/g{creat} (ref 0–29)
Microalbumin, Urine: 11.1 ug/mL

## 2023-09-07 LAB — CMP14+EGFR
ALT: 10 IU/L (ref 0–32)
AST: 14 IU/L (ref 0–40)
Albumin: 4.4 g/dL (ref 3.7–4.7)
Alkaline Phosphatase: 60 IU/L (ref 44–121)
BUN/Creatinine Ratio: 19 (ref 12–28)
BUN: 20 mg/dL (ref 8–27)
Bilirubin Total: 0.4 mg/dL (ref 0.0–1.2)
CO2: 22 mmol/L (ref 20–29)
Calcium: 10.3 mg/dL (ref 8.7–10.3)
Chloride: 103 mmol/L (ref 96–106)
Creatinine, Ser: 1.07 mg/dL — ABNORMAL HIGH (ref 0.57–1.00)
Globulin, Total: 2.6 g/dL (ref 1.5–4.5)
Glucose: 93 mg/dL (ref 70–99)
Potassium: 5.1 mmol/L (ref 3.5–5.2)
Sodium: 141 mmol/L (ref 134–144)
Total Protein: 7 g/dL (ref 6.0–8.5)
eGFR: 52 mL/min/{1.73_m2} — ABNORMAL LOW (ref >59)

## 2023-09-07 NOTE — Progress Notes (Signed)
 Pharmacy Quality Measure Review  This patient is appearing on a report for being at risk of failing the adherence measure for cholesterol (statin), diabetes, and hypertension (ACEi/ARB) medications this calendar year.   Medication: atorvastatin   Last fill date: 06/28/2023 for 90 day supply  Medication: pioglitazone  Last fill date: 09/05/2023 for 90 day supply. Has not been picked-up.  Medication: metformin  Last fill date: 07/13/2023 for 90 day supply  Medication: benazepril -hydrochlorothiazide   Last fill date: 09/05/2023 for 90 day supply. Has not picked-up.  Refills are up to date and pt saw Dr. Newlin 09/05/2023. [facility] pharmacy and they have not dispensed to the patient yet. Call placed to Myra to inform her that these were ready for pick-up. Myra voiced understanding.   Herlene Fleeta Morris, PharmD, JAQUELINE, CPP Clinical Pharmacist [facility] [phone]

## 2023-09-08 ENCOUNTER — Telehealth: Payer: Self-pay | Admitting: Family Medicine

## 2023-09-08 NOTE — Telephone Encounter (Signed)
 Call returned to the patient's daughter but she was unavailable. Left VM with instructions to return my call.

## 2023-09-08 NOTE — Telephone Encounter (Signed)
 Copied from CRM [id number]. Topic: Clinical - Medication Question >> Sep 08, 2023  1:03 PM Powell HERO wrote:  Reason for CRM: Myra requests a call back from Pharmacist Herlene to ask a question about a new mediation that was prescribed to the patient. Please return call when available.

## 2023-09-14 ENCOUNTER — Other Ambulatory Visit (HOSPITAL_BASED_OUTPATIENT_CLINIC_OR_DEPARTMENT_OTHER): Admitting: Pharmacist

## 2023-09-14 DIAGNOSIS — E119 Type 2 diabetes mellitus without complications: Secondary | ICD-10-CM

## 2023-09-14 DIAGNOSIS — E785 Hyperlipidemia, unspecified: Secondary | ICD-10-CM

## 2023-09-14 DIAGNOSIS — Z7984 Long term (current) use of oral hypoglycemic drugs: Secondary | ICD-10-CM

## 2023-09-14 DIAGNOSIS — I10 Essential (primary) hypertension: Secondary | ICD-10-CM

## 2023-09-14 NOTE — Progress Notes (Signed)
 Pharmacy Quality Measure Review  This patient is appearing on a report for being at risk of failing the adherence measure for cholesterol (statin), diabetes, and hypertension (ACEi/ARB) medications this calendar year.   Medication: atorvastatin   Last fill date: 06/30/2023 for 90 day supply  Medication: pioglitazone  Last fill date: 09/08/2023 for 90 day supply.   Medication: metformin  Last fill date: 07/13/2023 for 90 day supply  Medication: benazepril -hydrochlorothiazide   Last fill date: 09/08/2023 for 90 day supply.   Refills are up to date and pt saw Dr. Newlin 09/05/2023. Refills were picked up last week. Reminder set for next fill date.   Herlene Fleeta Morris, PharmD, JAQUELINE, CPP Clinical Pharmacist [facility] [phone]

## 2023-09-22 DIAGNOSIS — Z961 Presence of intraocular lens: Secondary | ICD-10-CM | POA: Diagnosis not present

## 2023-09-22 DIAGNOSIS — H35373 Puckering of macula, bilateral: Secondary | ICD-10-CM | POA: Diagnosis not present

## 2023-09-28 ENCOUNTER — Other Ambulatory Visit: Payer: Self-pay | Admitting: Pharmacist

## 2023-09-28 ENCOUNTER — Encounter: Payer: Self-pay | Admitting: Pharmacist

## 2023-09-28 ENCOUNTER — Other Ambulatory Visit (HOSPITAL_BASED_OUTPATIENT_CLINIC_OR_DEPARTMENT_OTHER): Admitting: Pharmacist

## 2023-09-28 DIAGNOSIS — E119 Type 2 diabetes mellitus without complications: Secondary | ICD-10-CM

## 2023-09-28 DIAGNOSIS — E785 Hyperlipidemia, unspecified: Secondary | ICD-10-CM

## 2023-09-28 MED ORDER — PIOGLITAZONE HCL 15 MG PO TABS: 15.0000 mg | ORAL_TABLET | Freq: Every day | ORAL | 1 refills | Status: AC

## 2023-09-28 NOTE — Progress Notes (Signed)
 Pharmacy Quality Measure Review  This patient is appearing on a report for review of the adherence measure for statin medications this calendar year.   Medication: atorvastatin   Last fill date: 06/30/2023 for 90 day supply  Call placed to patient's pharmacy and it is ready for pick-up. Called and informed patient's daughter. She agrees to pick this up today.   Herlene Fleeta Morris, PharmD, JAQUELINE, CPP Clinical Pharmacist [facility] [phone]

## 2023-10-18 ENCOUNTER — Other Ambulatory Visit (HOSPITAL_COMMUNITY): Payer: Self-pay

## 2023-10-18 ENCOUNTER — Ambulatory Visit: Payer: Self-pay

## 2023-10-18 ENCOUNTER — Other Ambulatory Visit: Payer: Self-pay

## 2023-10-18 ENCOUNTER — Other Ambulatory Visit: Payer: Self-pay | Admitting: Family Medicine

## 2023-10-18 ENCOUNTER — Telehealth: Payer: Self-pay

## 2023-10-18 ENCOUNTER — Telehealth: Payer: Self-pay | Admitting: Family Medicine

## 2023-10-18 DIAGNOSIS — E119 Type 2 diabetes mellitus without complications: Secondary | ICD-10-CM

## 2023-10-18 DIAGNOSIS — Z1231 Encounter for screening mammogram for malignant neoplasm of breast: Secondary | ICD-10-CM

## 2023-10-18 MED ORDER — ACCU-CHEK GUIDE TEST VI STRP: ORAL_STRIP | 6 refills | Status: DC

## 2023-10-18 MED ORDER — ACCU-CHEK GUIDE TEST VI STRP: ORAL_STRIP | 12 refills | Status: DC

## 2023-10-18 MED ORDER — ACCU-CHEK SOFTCLIX LANCETS MISC: 12 refills | Status: DC

## 2023-10-18 MED ORDER — ACCU-CHEK GUIDE W/DEVICE KIT: PACK | 0 refills | Status: DC

## 2023-10-18 NOTE — Telephone Encounter (Signed)
 Done

## 2023-10-18 NOTE — Telephone Encounter (Signed)
 Daughter confirmed appt in person

## 2023-10-18 NOTE — Telephone Encounter (Signed)
 Patients daughter came in requesting refills on lancets, meter and test strips.

## 2023-10-18 NOTE — Telephone Encounter (Signed)
    FYI Only or Action Required?: Action required by provider: request for appointment, medication refill request, and need lancets.  Patient was last seen in primary care on 09/05/2023 by Newlin, Enobong, MD.  Called Nurse Triage reporting Hypertension.  Symptoms began today.  Interventions attempted: Nothing.  Symptoms are: stable.  Triage Disposition: See PCP When Office is Open (Within 3 Days)  Patient/caregiver understands and will follow disposition?: Yes   Copied from CRM [id number]. Topic: Clinical - Red Word Triage >> Oct 18, 2023  1:14 PM Antwanette L wrote: Red Word that prompted transfer to Nurse Triage: Bp is currently 180/55. EMS is at the pt home Reason for Disposition  Systolic BP >= 160 OR Diastolic >= 100  Answer Assessment - Initial Assessment Questions 1. BLOOD PRESSURE: What is your blood pressure? Did you take at least two measurements 5 minutes apart?     180/58-65 2. ONSET: When did you take your blood pressure?     today 3. HOW: How did you take your blood pressure? (e.g., automatic home BP monitor, visiting nurse)     EMS 4. HISTORY: Do you have a history of high blood pressure?     History of HTN 5. MEDICINES: Are you taking any medicines for blood pressure? Have you missed any doses recently?     Yes and no 6. OTHER SYMPTOMS: Do you have any symptoms? (e.g., blurred vision, chest pain, difficulty breathing, headache, weakness)     Burred vision,  7. PREGNANCY: Is there any chance you are pregnant? When was your last menstrual period?     No  Pt's daughter had to call EMS today due to pt not acting right and stated not feeling like herself.  EMS found FSBS low and BP 180/55. Pt requesting to follow up with PCP: no available appointments at present time.  Protocols used: Blood Pressure - High-A-AH

## 2023-10-18 NOTE — Telephone Encounter (Signed)
 Pt has been scheduled for 10/19/23 at 210

## 2023-10-19 ENCOUNTER — Ambulatory Visit: Attending: Family Medicine | Admitting: Family Medicine

## 2023-10-19 ENCOUNTER — Encounter: Payer: Self-pay | Admitting: Family Medicine

## 2023-10-19 VITALS — BP 132/74 | HR 65 | Ht 59.0 in | Wt 160.0 lb

## 2023-10-19 DIAGNOSIS — E1140 Type 2 diabetes mellitus with diabetic neuropathy, unspecified: Secondary | ICD-10-CM | POA: Diagnosis not present

## 2023-10-19 DIAGNOSIS — I10 Essential (primary) hypertension: Secondary | ICD-10-CM

## 2023-10-19 DIAGNOSIS — E1159 Type 2 diabetes mellitus with other circulatory complications: Secondary | ICD-10-CM | POA: Diagnosis not present

## 2023-10-19 DIAGNOSIS — R55 Syncope and collapse: Secondary | ICD-10-CM | POA: Diagnosis not present

## 2023-10-19 DIAGNOSIS — E119 Type 2 diabetes mellitus without complications: Secondary | ICD-10-CM

## 2023-10-19 MED ORDER — ACCU-CHEK GUIDE TEST VI STRP: ORAL_STRIP | 6 refills | Status: AC

## 2023-10-19 MED ORDER — GABAPENTIN 300 MG PO CAPS: 300.0000 mg | ORAL_CAPSULE | Freq: Every day | ORAL | 1 refills | Status: AC

## 2023-10-19 MED ORDER — ACCU-CHEK GUIDE W/DEVICE KIT: PACK | 0 refills | Status: AC

## 2023-10-19 MED ORDER — ACCU-CHEK SOFTCLIX LANCETS MISC: 6 refills | Status: AC

## 2023-10-19 NOTE — Progress Notes (Signed)
 Subjective:  Patient ID: Briana Patton, female    DOB: 24-Apr-1940  Age: 83 y.o. MRN: [id number]  CC: Fatigue (No energy/EMS called to home on yesterday)     Discussed the use of AI scribe software for clinical note transcription with the patient, who gave verbal consent to proceed.  History of Present Illness Briana Patton is an 83 year old female with a history of PVD, IBS, type 2 diabetes mellitus, dementia  who presents with fatigue and dizziness. She is accompanied by her daughter, Harland and her husband.  She experienced extreme fatigue and dizziness, with a sensation of 'losing her response' and feeling as though her 'time was up' the previous day. There was no chest pain or dyspnea, but dizziness and low energy were present. On the day of the episode, her blood sugar was 188 mg/dL, and her blood pressure was 180/58 mmHg. EMS was called and a 3 lead EKG revealed no findings suspicious for ACS. She had one episode of vomiting. Symptoms have somewhat improved but she does not feel she is back to her baseline.  Her hypertension is managed with benazepril  hydrochlorothiazide  20/12.5 mg, and her blood pressure is 152/76 mmHg during the visit. She does not regularly monitor her blood pressure at home, and her blood sugar monitoring equipment is outdated.  She experiences dizziness when standing quickly and when lying down at night. Her left leg feels consistently cold without pain or numbness. She takes gabapentin  for neuropathy but only once daily instead of the prescribed three times. She feels like her normal self again, though not fully energetic.  She is not very physically active, often sitting on the couch, and her daughter encourages her to engage in more physical activity, such as walking at the [facility].    Past Medical History:  Diagnosis Date   Abdominal pain    in past   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   Chronic kidney disease, stage 3a 05/05/2021   Dementia  due to Alzheimer's disease 01/13/2022   Diverticulosis of colon 07/26/2006   Essential hypertension 07/26/2006   Generalized anxiety disorder 05/05/2021   GERD (gastroesophageal reflux disease)    Hyperlipidemia    IBS (irritable bowel syndrome) 06/03/2011   Internal hemorrhoids without mention of complication    Morbid obesity 05/05/2021   Osteoarthritis    Palpitations 09/04/2007   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   PVD (peripheral vascular disease) 05/05/2021   S/P left knee arthroscopy 07/26/2006   Seborrheic dermatitis 05/18/2021   Short-segment Barrett's esophagus 06/03/2011   Type II diabetes mellitus 07/26/2006    Past Surgical History:  Procedure Laterality Date   ABDOMINAL HYSTERECTOMY     CESAREAN SECTION     3 times   COLONOSCOPY     KNEE ARTHROSCOPY     left knee surgery 1999   LUMBAR LAMINECTOMY     UPPER GASTROINTESTINAL ENDOSCOPY      Family History  Problem Relation Age of Onset   Diabetes Mother    Diabetes Father    Stroke Father    Alcohol abuse Sister        X2   Dementia Sister    Memory loss Sister    Diabetes Brother    Epilepsy Brother    Seizures Brother        from prior head injury at 70-87 years old   Colon cancer Neg Hx    Esophageal cancer Neg Hx    Rectal cancer Neg Hx  Stomach cancer Neg Hx     Social History   Socioeconomic History   Marital status: Married    Spouse name: Not on file   Number of children: 3   Years of education: 9   Highest education level: GED or equivalent  Occupational History   Not on file  Tobacco Use   Smoking status: Former    Current packs/day: 0.00    Types: Cigarettes    Quit date: 03/08/1998    Years since quitting: 25.6   Smokeless tobacco: Never  Vaping Use   Vaping status: Never Used  Substance and Sexual Activity   Alcohol use: No    Alcohol/week: 0.0 standard drinks of alcohol   Drug use: No   Sexual activity: Not on file  Other Topics Concern   Not on  file  Social History Narrative   Father age 40 complications of cardiac and cerebrovascular disease   Right handed   One story home   No caffeine   Social Drivers of Corporate investment banker Strain: Low Risk  (08/24/2022)   Overall Financial Resource Strain (CARDIA)    Difficulty of Paying Living Expenses: Not hard at all  Food Insecurity: No Food Insecurity (08/24/2022)   Hunger Vital Sign    Worried About Running Out of Food in the Last Year: Never true    Ran Out of Food in the Last Year: Never true  Transportation Needs: No Transportation Needs (08/24/2022)   PRAPARE - Administrator, Civil Service (Medical): No    Lack of Transportation (Non-Medical): No  Physical Activity: Insufficiently Active (08/24/2022)   Exercise Vital Sign    Days of Exercise per Week: 3 days    Minutes of Exercise per Session: 30 min  Stress: No Stress Concern Present (08/24/2022)   [employer] of Occupational Health - Occupational Stress Questionnaire    Feeling of Stress : Not at all  Social Connections: Moderately Integrated (08/24/2022)   Social Connection and Isolation Panel    Frequency of Communication with Friends and Family: More than three times a week    Frequency of Social Gatherings with Friends and Family: Three times a week    Attends Religious Services: More than 4 times per year    Active Member of Clubs or Organizations: No    Attends Banker Meetings: Never    Marital Status: Married    Allergies  Allergen Reactions   Penicillins     WHELTS    Outpatient Medications Prior to Visit  Medication Sig Dispense Refill   albuterol  (VENTOLIN  HFA) 108 (90 Base) MCG/ACT inhaler Inhale 2 puffs into the lungs every 6 (six) hours as needed for wheezing or shortness of breath. 1 each 0   atorvastatin  (LIPITOR) 40 MG tablet TAKE 1 TABLET BY MOUTH ONCE DAILY . APPOINTMENT REQUIRED FOR FUTURE REFILLS 90 tablet 0   benazepril -hydrochlorthiazide (LOTENSIN  HCT)  20-12.5 MG tablet Take 1 tablet by mouth daily. 90 tablet 1   diclofenac  Sodium (VOLTAREN ) 1 % GEL Apply 4 g topically 2 (two) times daily as needed. 100 g 1   donepezil  (ARICEPT ) 10 MG tablet TAKE 1/2 TABLET BY MOUTH FOR 2 WEEKS, THEN INCREASE TO 1 TABLET DAILY 30 tablet 0   Lancets (FREESTYLE) lancets Test once daily. 100 each 5   metFORMIN  (GLUCOPHAGE ) 1000 MG tablet Take 1 tablet (1,000 mg total) by mouth 2 (two) times daily with a meal. 180 tablet 1   Multiple Vitamins-Calcium  (ONE-A-DAY WOMENS FORMULA  PO) Take 1 tablet by mouth daily.     pioglitazone  (ACTOS ) 15 MG tablet Take 1 tablet (15 mg total) by mouth daily. 90 tablet 1   risedronate  (ACTONEL ) 35 MG tablet Take 1 tablet (35 mg total) by mouth every 7 (seven) days. with water on empty stomach, nothing by mouth or lie down for next 30 minutes. 12 tablet 1   Turmeric 500 MG CAPS See admin instructions.     Accu-Chek Softclix Lancets lancets Use to check blood sugar once daily. E11.9 100 each 6   Blood Glucose Monitoring Suppl (ACCU-CHEK GUIDE) w/Device KIT Use to check blood sugar once daily. E11.9 1 kit 0   gabapentin  (NEURONTIN ) 100 MG capsule Take 1 capsule (100 mg total) by mouth 3 (three) times daily. 270 capsule 1   glucose blood (ACCU-CHEK GUIDE TEST) test strip Use to check blood sugar once daily. E11.9 100 each 6   No facility-administered medications prior to visit.     ROS Review of Systems  Constitutional:  Positive for fatigue. Negative for activity change and appetite change.  HENT:  Negative for sinus pressure and sore throat.   Respiratory:  Negative for chest tightness, shortness of breath and wheezing.   Cardiovascular:  Negative for chest pain and palpitations.  Gastrointestinal:  Negative for abdominal distention, abdominal pain and constipation.  Genitourinary: Negative.   Musculoskeletal: Negative.   Neurological:  Positive for dizziness and numbness.  Psychiatric/Behavioral:  Negative for behavioral  problems and dysphoric mood.     Objective:  BP 132/74   Pulse 65   Ht 4' 11 (1.499 m)   Wt 160 lb (72.6 kg)   SpO2 98%   BMI 32.32 kg/m      10/19/2023    3:27 PM 10/19/2023    2:06 PM 09/05/2023    4:00 PM  BP/Weight  Systolic BP 132 152 144  Diastolic BP 74 76 77  Wt. (Lbs)  160   BMI  32.32 kg/m2       Physical Exam Constitutional:      Appearance: She is well-developed.  Cardiovascular:     Rate and Rhythm: Normal rate.     Heart sounds: Normal heart sounds. No murmur heard. Pulmonary:     Effort: Pulmonary effort is normal.     Breath sounds: Normal breath sounds. No wheezing or rales.  Chest:     Chest wall: No tenderness.  Abdominal:     General: Bowel sounds are normal. There is no distension.     Palpations: Abdomen is soft. There is no mass.     Tenderness: There is no abdominal tenderness.  Musculoskeletal:        General: Normal range of motion.     Right lower leg: No edema.     Left lower leg: No edema.  Neurological:     Mental Status: She is alert and oriented to person, place, and time.  Psychiatric:        Mood and Affect: Mood normal.        Latest Ref Rng & Units 09/05/2023    4:05 PM 12/22/2022   11:29 AM 02/16/2022   11:29 AM  CMP  Glucose 70 - 99 mg/dL 93  888  890   BUN 8 - 27 mg/dL 20  26  24    Creatinine 0.57 - 1.00 mg/dL 8.92  8.89  9.02   Sodium 134 - 144 mmol/L 141  134  135   Potassium 3.5 - 5.2 mmol/L 5.1  4.8  4.5   Chloride 96 - 106 mmol/L 103  96  101   CO2 20 - 29 mmol/L 22  24  20    Calcium  8.7 - 10.3 mg/dL 89.6  9.8  9.2   Total Protein 6.0 - 8.5 g/dL 7.0  7.2    Total Bilirubin 0.0 - 1.2 mg/dL 0.4  0.5    Alkaline Phos 44 - 121 IU/L 60  61    AST 0 - 40 IU/L 14  14    ALT 0 - 32 IU/L 10  10      Lipid Panel     Component Value Date/Time   CHOL 166 09/15/2021 1154   TRIG 94 09/15/2021 1154   HDL 74 09/15/2021 1154   CHOLHDL 2.2 09/15/2021 1154   CHOLHDL 3 11/03/2016 1017   VLDL 23.4 11/03/2016 1017    LDLCALC 75 09/15/2021 1154   LDLDIRECT 142.1 05/26/2012 0945    CBC    Component Value Date/Time   WBC 5.8 12/22/2022 1129   WBC 5.4 05/12/2021 0915   RBC 3.51 (L) 12/22/2022 1129   RBC 3.43 (L) 05/12/2021 0915   HGB 10.7 (L) 12/22/2022 1129   HCT 31.8 (L) 12/22/2022 1129   PLT 253 12/22/2022 1129   MCV 91 12/22/2022 1129   MCH 30.5 12/22/2022 1129   MCHC 33.6 12/22/2022 1129   MCHC 32.6 05/12/2021 0915   RDW 13.0 12/22/2022 1129   LYMPHSABS 1.8 12/22/2022 1129   MONOABS 0.3 05/04/2017 1008   EOSABS 0.1 12/22/2022 1129   BASOSABS 0.1 12/22/2022 1129    Lab Results  Component Value Date   HGBA1C 6.1 09/05/2023        Assessment & Plan Near syncope Recent syncope with dizziness and fatigue. EKG showed sinus rhythm. No acute cardiac issues. Vertigo noted when lying down, but vertigo medication not recommended due to sedation risk. - Order complete blood count to evaluate for anemia. - Perform 12-lead EKG to compare with EMS findings - NSR, no ST changes -Head cT ordered - Advise monitoring blood pressure at home weekly. - Educated on orthostatic hypotension and recommended slow positional changes.  Hypertension associated with diabetes Blood pressure elevated initially but normalized on repeat. Managed with benazepril -hydrochlorothiazide . No medication adjustment needed as blood pressure is controlled. - Monitor blood pressure at home weekly. - Re-evaluate blood pressure management if home readings are consistently elevated.  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 Currently on gabapentin  100 mg once daily. Plan to adjust dosage to 300 mg once daily at night. Discussed potential sedation with gabapentin . - Adjust gabapentin  to 300 mg once daily at night. - Educate on proper medication adherence.     Meds ordered this encounter  Medications    gabapentin  (NEURONTIN ) 300 MG capsule    Sig: Take 1 capsule (300 mg total) by mouth at bedtime.    Dispense:  90 capsule    Refill:  1   Blood Glucose Monitoring Suppl (ACCU-CHEK GUIDE) w/Device KIT    Sig: Use to check blood sugar once daily. E11.9    Dispense:  1 kit    Refill:  0   Accu-Chek Softclix Lancets lancets    Sig: Use to check blood sugar once daily. E11.9    Dispense:  100 each    Refill:  6   glucose blood (ACCU-CHEK GUIDE TEST) test strip    Sig: Use to check blood sugar  once daily. E11.9    Dispense:  100 each    Refill:  6    Follow-up: Return for previously scheduled appointment.       Corrina Sabin, MD, FAAFP. [facility] [place], [place] [phone]   10/19/2023, 3:37 PM

## 2023-10-19 NOTE — Patient Instructions (Signed)
 VISIT SUMMARY:  Today, you came in with your daughter, Bernarda, because you have been feeling very tired and dizzy. You mentioned a particularly concerning episode where you felt like you were losing your response and thought your time was up. Your blood pressure and blood sugar levels were checked, and you were evaluated for any heart-related issues. We discussed your current medications and lifestyle habits.  YOUR PLAN:  -SYNCOPE : Syncope means a temporary loss of consciousness usually related to insufficient blood flow to the brain. We did not find any acute heart issues, but you should monitor your blood pressure at home weekly and change positions slowly to avoid dizziness. We will also check your blood count and perform another EKG to compare with previous results.  -HYPERTENSION ASSOCIATED WITH DIABETES: Hypertension is high blood pressure, which can be more complicated when you have diabetes. Your blood pressure was high initially but normalized during the visit. Continue taking your current medication and start monitoring your blood pressure at home weekly. If your readings are consistently high, we may need to adjust your treatment.  -TYPE 2 DIABETES MELLITUS COMPLICATED BY DIABETIC NEUROPATHY, LEFT LOWER EXTREMITY: Diabetic neuropathy is nerve damage caused by diabetes, which can cause symptoms like coldness and numbness in your legs. We are increasing your gabapentin  dose to 300 mg once daily at night to help manage these symptoms. Make sure to take your medication as prescribed.  INSTRUCTIONS:  Please monitor your blood pressure at home weekly and keep a record of the readings. We will follow up on your blood pressure and blood sugar levels, and you should come back if you experience any more episodes of dizziness or extreme fatigue. Additionally, we will perform a complete blood count and another EKG to ensure everything is okay.

## 2023-10-20 ENCOUNTER — Ambulatory Visit: Payer: Self-pay | Admitting: Family Medicine

## 2023-10-20 LAB — CBC WITH DIFFERENTIAL/PLATELET
Basophils Absolute: 0.1 x10E3/uL (ref 0.0–0.2)
Basos: 1 %
EOS (ABSOLUTE): 0.1 x10E3/uL (ref 0.0–0.4)
Eos: 1 %
Hematocrit: 34.7 % (ref 34.0–46.6)
Hemoglobin: 11.3 g/dL (ref 11.1–15.9)
Immature Grans (Abs): 0 x10E3/uL (ref 0.0–0.1)
Immature Granulocytes: 0 %
Lymphocytes Absolute: 2.4 x10E3/uL (ref 0.7–3.1)
Lymphs: 34 %
MCH: 29.9 pg (ref 26.6–33.0)
MCHC: 32.6 g/dL (ref 31.5–35.7)
MCV: 92 fL (ref 79–97)
Monocytes Absolute: 0.5 x10E3/uL (ref 0.1–0.9)
Monocytes: 7 %
Neutrophils Absolute: 4 x10E3/uL (ref 1.4–7.0)
Neutrophils: 57 %
Platelets: 245 x10E3/uL (ref 150–450)
RBC: 3.78 x10E6/uL (ref 3.77–5.28)
RDW: 12.8 % (ref 11.7–15.4)
WBC: 7.1 x10E3/uL (ref 3.4–10.8)

## 2023-10-20 LAB — CMP14+EGFR
ALT: 10 [IU]/L (ref 0–32)
AST: 14 [IU]/L (ref 0–40)
Albumin: 4.7 g/dL (ref 3.7–4.7)
Alkaline Phosphatase: 61 [IU]/L (ref 44–121)
BUN/Creatinine Ratio: 14 (ref 12–28)
BUN: 18 mg/dL (ref 8–27)
Bilirubin Total: 0.4 mg/dL (ref 0.0–1.2)
CO2: 22 mmol/L (ref 20–29)
Calcium: 9.6 mg/dL (ref 8.7–10.3)
Chloride: 96 mmol/L (ref 96–106)
Creatinine, Ser: 1.26 mg/dL — ABNORMAL HIGH (ref 0.57–1.00)
Globulin, Total: 2.6 g/dL (ref 1.5–4.5)
Glucose: 111 mg/dL — ABNORMAL HIGH (ref 70–99)
Potassium: 4.2 mmol/L (ref 3.5–5.2)
Sodium: 136 mmol/L (ref 134–144)
Total Protein: 7.3 g/dL (ref 6.0–8.5)
eGFR: 43 mL/min/{1.73_m2} — ABNORMAL LOW

## 2023-10-20 LAB — VITAMIN D 25 HYDROXY (VIT D DEFICIENCY, FRACTURES): Vit D, 25-Hydroxy: 25.4 ng/mL — ABNORMAL LOW (ref 30.0–100.0)

## 2023-10-27 ENCOUNTER — Ambulatory Visit
Admission: RE | Admit: 2023-10-27 | Discharge: 2023-10-27 | Disposition: A | Discharge status: Home / Self Care | Source: Ambulatory Visit | Attending: Family Medicine | Admitting: Family Medicine

## 2023-10-27 DIAGNOSIS — I6782 Cerebral ischemia: Secondary | ICD-10-CM | POA: Diagnosis not present

## 2023-10-27 DIAGNOSIS — R55 Syncope and collapse: Secondary | ICD-10-CM | POA: Diagnosis not present

## 2023-11-11 NOTE — Telephone Encounter (Signed)
 I gave the patient a call regarding request to discuss CT head results with me but could not reach her and mailbox was full.

## 2023-11-16 ENCOUNTER — Telehealth: Payer: Self-pay

## 2023-11-16 NOTE — Telephone Encounter (Signed)
 Error

## 2023-12-12 ENCOUNTER — Ambulatory Visit

## 2023-12-20 ENCOUNTER — Ambulatory Visit

## 2024-01-11 ENCOUNTER — Ambulatory Visit

## 2024-01-11 DIAGNOSIS — M810 Age-related osteoporosis without current pathological fracture: Secondary | ICD-10-CM | POA: Diagnosis not present

## 2024-01-11 DIAGNOSIS — E1142 Type 2 diabetes mellitus with diabetic polyneuropathy: Secondary | ICD-10-CM | POA: Diagnosis not present

## 2024-01-11 DIAGNOSIS — M1990 Unspecified osteoarthritis, unspecified site: Secondary | ICD-10-CM | POA: Diagnosis not present

## 2024-01-11 DIAGNOSIS — M545 Low back pain, unspecified: Secondary | ICD-10-CM | POA: Diagnosis not present

## 2024-01-11 DIAGNOSIS — F0390 Unspecified dementia without behavioral disturbance: Secondary | ICD-10-CM | POA: Diagnosis not present

## 2024-01-11 DIAGNOSIS — I10 Essential (primary) hypertension: Secondary | ICD-10-CM | POA: Diagnosis not present

## 2024-01-11 DIAGNOSIS — Z9842 Cataract extraction status, left eye: Secondary | ICD-10-CM | POA: Diagnosis not present

## 2024-01-11 DIAGNOSIS — G8929 Other chronic pain: Secondary | ICD-10-CM | POA: Diagnosis not present

## 2024-01-11 DIAGNOSIS — Z87891 Personal history of nicotine dependence: Secondary | ICD-10-CM | POA: Diagnosis not present

## 2024-01-11 DIAGNOSIS — E1169 Type 2 diabetes mellitus with other specified complication: Secondary | ICD-10-CM | POA: Diagnosis not present

## 2024-01-11 DIAGNOSIS — E785 Hyperlipidemia, unspecified: Secondary | ICD-10-CM | POA: Diagnosis not present

## 2024-01-11 DIAGNOSIS — E669 Obesity, unspecified: Secondary | ICD-10-CM | POA: Diagnosis not present

## 2024-01-13 ENCOUNTER — Ambulatory Visit
Admission: RE | Admit: 2024-01-13 | Discharge: 2024-01-13 | Disposition: A | Discharge status: Home / Self Care | Source: Ambulatory Visit | Attending: Family Medicine | Admitting: Family Medicine

## 2024-01-13 DIAGNOSIS — Z1231 Encounter for screening mammogram for malignant neoplasm of breast: Secondary | ICD-10-CM

## 2024-01-17 ENCOUNTER — Ambulatory Visit: Payer: Self-pay | Admitting: Family Medicine

## 2024-01-20 ENCOUNTER — Telehealth: Payer: Self-pay | Admitting: Pharmacist

## 2024-01-20 ENCOUNTER — Other Ambulatory Visit: Payer: Self-pay | Admitting: Pharmacist

## 2024-01-20 NOTE — Progress Notes (Signed)
 Pharmacy Quality Measure Review  This patient is appearing on a report for being at risk of failing the adherence measure for hypertension (ACEi/ARB) medications this calendar year.   Medication: benazepril -HCTZ Last fill date: 09/05/2023 for 90 day supply  Contacted pharmacy to facilitate refills. Contacted patient's daughter and she will go by and get refills this weekend.   Herlene Fleeta Morris, PharmD, JAQUELINE, CPP Clinical Pharmacist [facility] [phone]

## 2024-01-20 NOTE — Telephone Encounter (Signed)
 Received call from patient's daughter, Harland. Originally, the call was for a refill of the patient's benazepril -hydrochlorothiazide . I approved this and sent to [facility]. Per Myra, she will pick this up this weekend.   Before ending the call, Myra expressed concerns with CT findings from 10/27/23. Per CT impressions made by the Radiologist, there were moderate patchy white matter hypodensities are nonspecific but compatible with chronic microvascular ischemic disease. Per Myra, she had called our [facility] to request a sooner appt than the one upcoming in Jan to discuss the significance of those findings. She also expresses concern with her mother's worsening memory and declining cognitive function. I do not see documentation of this, but told her I would send directly to PCP to see if we can schedule her for a sooner appt.   Herlene Fleeta Morris, PharmD, JAQUELINE, CPP Clinical Pharmacist [facility] [phone]

## 2024-01-20 NOTE — Telephone Encounter (Signed)
 FYI

## 2024-01-20 NOTE — Telephone Encounter (Signed)
 Please schedule an earlier appointment with me but also inform her she is under the care of Neu[facility]) who has been managing her Dementia and I would recommend she make an appointment with her as well. Thanks

## 2024-01-20 NOTE — Progress Notes (Signed)
 Opened in error

## 2024-01-24 NOTE — Telephone Encounter (Signed)
 LVM for return call.

## 2024-01-31 ENCOUNTER — Ambulatory Visit: Attending: Family Medicine

## 2024-01-31 VITALS — Ht 59.0 in | Wt 160.0 lb

## 2024-01-31 DIAGNOSIS — Z0000 Encounter for general adult medical examination without abnormal findings: Secondary | ICD-10-CM

## 2024-01-31 NOTE — Progress Notes (Signed)
 I connected with  Briana Patton on 01/31/24 by a audio enabled telemedicine application and verified that I am speaking with the correct person using two identifiers.  Patient Location: Home  Provider Location: Office/Clinic  Persons Participating in Visit: Patient assisted by Husband.  I discussed the limitations of evaluation and management by telemedicine. The patient expressed understanding and agreed to proceed.   Vital Signs: Because this visit was a virtual/telehealth visit, some criteria may be missing or patient reported. Any vitals not documented were not able to be obtained and vitals that have been documented are patient reported.     Chief Complaint  Patient presents with   Medicare Wellness    SUBSEQUENT     Subjective:   Briana Patton is a 83 y.o. female who presents for a Medicare Annual Wellness Visit.  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   Diverticulosis of colon 07/26/2006   Essential hypertension 07/26/2006   Generalized anxiety disorder 05/05/2021   GERD (gastroesophageal reflux disease)    Hyperlipidemia    IBS (irritable bowel syndrome) 06/03/2011   Internal hemorrhoids without mention of complication    Morbid obesity 05/05/2021   Osteoarthritis    Palpitations 09/04/2007   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   PVD (peripheral vascular disease) 05/05/2021   S/P left knee arthroscopy 07/26/2006   Seborrheic dermatitis 05/18/2021   Short-segment Barrett's esophagus 06/03/2011   Type II diabetes mellitus 07/26/2006   Past Surgical History:  Procedure Laterality Date   ABDOMINAL HYSTERECTOMY     CESAREAN SECTION     3 times   COLONOSCOPY     KNEE ARTHROSCOPY     left knee surgery 1999   LUMBAR  LAMINECTOMY     UPPER GASTROINTESTINAL ENDOSCOPY     Family History  Problem Relation Age of Onset   Diabetes Mother    Diabetes Father    Stroke Father    Alcohol abuse Sister        X2   Dementia Sister    Memory loss Sister    Diabetes Brother    Epilepsy Brother    Seizures Brother        from prior head injury at 43-24 years old   Colon cancer Neg Hx    Esophageal cancer Neg Hx    Rectal cancer Neg Hx    Stomach cancer Neg Hx    Breast cancer Neg Hx    Social History   Occupational History   Not on file  Tobacco Use   Smoking status: Former    Current packs/day: 0.00    Types: Cigarettes    Quit date: 03/08/1998    Years since quitting: 25.9   Smokeless tobacco: Never  Vaping Use   Vaping status: Never Used  Substance and Sexual Activity   Alcohol use: No    Alcohol/week: 0.0 standard drinks of alcohol   Drug use: No   Sexual activity: Not on file   Tobacco Counseling Counseling given: Not Answered  SDOH Screenings   Food Insecurity: No Food Insecurity (01/31/2024)  Housing: Low Risk  (01/31/2024)  Transportation Needs: No Transportation Needs (01/31/2024)  Utilities: Not At Risk (01/31/2024)  Alcohol Screen: Low Risk  (01/31/2024)  Depression (PHQ2-9): Low Risk  (09/05/2023)  Financial  Resource Strain: Low Risk  (01/31/2024)  Physical Activity: Inactive (01/31/2024)  Social Connections: Moderately Integrated (01/31/2024)  Stress: No Stress Concern Present (01/31/2024)  Tobacco Use: Medium Risk (01/31/2024)  Health Literacy: Patient Unable To Answer (01/31/2024)   See flowsheets for full screening details  Depression Screen Depression Screening Exception Documentation Depression Screening Exception:: Medical reason Depression Screening Exception Comment:: not able to complete during this visit  PHQ 2 & 9 Depression Scale- Over the past 2 weeks, how often have you been bothered by any of the following problems? Little interest or pleasure in doing  things: 0 Feeling down, depressed, or hopeless (PHQ Adolescent also includes...irritable): 0 PHQ-2 Total Score: 0     Goals Addressed             This Visit's Progress    01/31/24: To stay out the hospital         Visit info / Clinical Intake: Medicare Wellness Visit Type:: Subsequent Annual Wellness Visit Persons participating in visit:: patient & caregiver (HUSBAND) Medicare Wellness Visit Mode:: Telephone If telephone:: video declined Because this visit was a virtual/telehealth visit:: pt reported vitals (HUSBAND REPORTED) If Telephone or Video please confirm:: I connected with the patient using audio enabled telemedicine application and verified that I am speaking with the correct person using two identifiers; I discussed the limitations of evaluation and management by telemedicine; The patient expressed understanding and agreed to proceed Patient Location:: HOME Provider Location:: OFFICE Information given by:: caregiver Interpreter Needed?: No Pre-visit prep was completed: yes AWV questionnaire completed by patient prior to visit?: no Living arrangements:: lives with spouse/significant other Patient's Overall Health Status Rating: good Typical amount of pain: none Does pain affect daily life?: no Are you currently prescribed opioids?: no  Dietary Habits and Nutritional Risks How many meals a day?: 2 Eats fruit and vegetables daily?: yes Most meals are obtained by: preparing own meals In the last 2 weeks, have you had any of the following?: none Diabetic:: (!) yes Any non-healing wounds?: no How often do you check your BS?: 1 Would you like to be referred to a Nutritionist or for Diabetic Management? : no  Functional Status Activities of Daily Living (to include ambulation/medication): (!) Needs Assist Feeding: Independent Dressing/Grooming: Independent Bathing: Independent Toileting: Independent Transfer: Independent Ambulation: Independent with device-  listed below Home Assistive Devices/Equipment: Eyeglasses; Cane; Elevated toliet seat Medication Administration: Independent Home Management: Independent Manage your own finances?: (!) no (HUSBAND) Primary transportation is: driving Concerns about vision?: no *vision screening is required for WTM* Concerns about hearing?: no  Fall Screening Falls in the past year?: 0 Number of falls in past year: 0 Was there an injury with Fall?: 0 Fall Risk Category Calculator: 0 Patient Fall Risk Level: Low Fall Risk  Fall Risk Patient at Risk for Falls Due to: No Fall Risks Fall risk Follow up: Falls evaluation completed; Education provided  Home and Transportation Safety: All rugs have non-skid backing?: N/A, no rugs All stairs or steps have railings?: N/A, no stairs Grab bars in the bathtub or shower?: yes Have non-skid surface in bathtub or shower?: yes Good home lighting?: yes Regular seat belt use?: yes Hospital stays in the last year:: no  Cognitive Assessment Difficulty concentrating, remembering, or making decisions? : yes Will 6CIT or Mini Cog be Completed: no 6CIT or Mini Cog Declined: patient has a diagnosis of dementia or cognitive impairment  Advance Directives (For Healthcare) Does Patient Have a Medical Advance Directive?: No Would patient like information on creating  a medical advance directive?: No - Patient declined  Reviewed/Updated  Reviewed/Updated: Reviewed All (Medical, Surgical, Family, Medications, Allergies, Care Teams, Patient Goals)        Objective:    Today's Vitals   01/31/24 1047  Weight: 160 lb (72.6 kg)  Height: 4' 11 (1.499 m)  PainSc: 0-No pain   Body mass index is 32.32 kg/m.  Current Medications (verified) Outpatient Encounter Medications as of 01/31/2024  Medication Sig   Accu-Chek Softclix Lancets lancets Use to check blood sugar once daily. E11.9   albuterol  (VENTOLIN  HFA) 108 (90 Base) MCG/ACT inhaler Inhale 2 puffs into the  lungs every 6 (six) hours as needed for wheezing or shortness of breath.   atorvastatin  (LIPITOR) 40 MG tablet TAKE 1 TABLET BY MOUTH ONCE DAILY . APPOINTMENT REQUIRED FOR FUTURE REFILLS   benazepril -hydrochlorthiazide (LOTENSIN  HCT) 20-12.5 MG tablet Take 1 tablet by mouth daily.   Blood Glucose Monitoring Suppl (ACCU-CHEK GUIDE) w/Device KIT Use to check blood sugar once daily. E11.9   diclofenac  Sodium (VOLTAREN ) 1 % GEL Apply 4 g topically 2 (two) times daily as needed.   donepezil  (ARICEPT ) 10 MG tablet TAKE 1/2 TABLET BY MOUTH FOR 2 WEEKS, THEN INCREASE TO 1 TABLET DAILY   gabapentin  (NEURONTIN ) 300 MG capsule Take 1 capsule (300 mg total) by mouth at bedtime.   glucose blood (ACCU-CHEK GUIDE TEST) test strip Use to check blood sugar once daily. E11.9   Lancets (FREESTYLE) lancets Test once daily.   metFORMIN  (GLUCOPHAGE ) 1000 MG tablet Take 1 tablet (1,000 mg total) by mouth 2 (two) times daily with a meal.   Multiple Vitamins-Calcium  (ONE-A-DAY WOMENS FORMULA PO) Take 1 tablet by mouth daily.   pioglitazone  (ACTOS ) 15 MG tablet Take 1 tablet (15 mg total) by mouth daily.   risedronate  (ACTONEL ) 35 MG tablet Take 1 tablet (35 mg total) by mouth every 7 (seven) days. with water on empty stomach, nothing by mouth or lie down for next 30 minutes.   Turmeric 500 MG CAPS See admin instructions.   No facility-administered encounter medications on file as of 01/31/2024.   Hearing/Vision screen Hearing Screening - Comments:: Patient does not wear hearing aids. Vision Screening - Comments:: Wears rx glasses - up to date with routine eye exams with Arlyss King, MD.  Immunizations and Health Maintenance Health Maintenance  Topic Date Due   Zoster Vaccines- Shingrix (1 of 2) Never done   Influenza Vaccine  10/07/2023   COVID-19 Vaccine (4 - 2025-26 season) 11/07/2023   FOOT EXAM  12/22/2023   HEMOGLOBIN A1C  03/06/2024   OPHTHALMOLOGY EXAM  07/18/2024   Diabetic kidney evaluation - Urine  ACR  09/04/2024   Diabetic kidney evaluation - eGFR measurement  10/18/2024   Medicare Annual Wellness (AWV)  01/30/2025   DTaP/Tdap/Td (3 - Td or Tdap) 05/06/2031   Pneumococcal Vaccine: 50+ Years  Completed   Bone Density Scan  Completed   Meningococcal B Vaccine  Aged Out        Assessment/Plan:  This is a routine wellness examination for [facility].  Patient Care Team: Delbert Clam, MD as PCP - General (Family Medicine) King Arlyss, MD as Consulting Physician (Ophthalmology)  I have personally reviewed and noted the following in the patient's chart:   Medical and social history Use of alcohol, tobacco or illicit drugs  Current medications and supplements including opioid prescriptions. Functional ability and status Nutritional status Physical activity Advanced directives List of other physicians Hospitalizations, surgeries, and ER visits in previous 12  months Vitals Screenings to include cognitive, depression, and falls Referrals and appointments  No orders of the defined types were placed in this encounter.  In addition, I have reviewed and discussed with patient certain preventive protocols, quality metrics, and best practice recommendations. A written personalized care plan for preventive services as well as general preventive health recommendations were provided to patient.   Roz LOISE Fuller, LPN   [id number]   Return in 1 year (on 01/30/2025).  After Visit Summary: (Declined) Due to this being a telephonic visit, with patients personalized plan was offered to patient but patient Declined AVS at this time   Nurse Notes: Husband assisted with AWV for spouse, who has dementia due to Alzheimer's disease.

## 2024-01-31 NOTE — Patient Instructions (Signed)
 Briana Patton,  Thank you for taking the time for your Medicare Wellness Visit. I appreciate your continued commitment to your health goals. Please review the care plan we discussed, and feel free to reach out if I can assist you further.  Please note that Annual Wellness Visits do not include a physical exam. Some assessments may be limited, especially if the visit was conducted virtually. If needed, we may recommend an in-person follow-up with your provider.  Ongoing Care Seeing your primary care provider every 3 to 6 months helps us  monitor your health and provide consistent, personalized care.   Referrals If a referral was made during today's visit and you haven't received any updates within two weeks, please contact the referred provider directly to check on the status.  Recommended Screenings:  Health Maintenance  Topic Date Due   Zoster (Shingles) Vaccine (1 of 2) Never done   Medicare Annual Wellness Visit  08/24/2023   Flu Shot  10/07/2023   COVID-19 Vaccine (4 - 2025-26 season) 11/07/2023   Complete foot exam   12/22/2023   Hemoglobin A1C  03/06/2024   Eye exam for diabetics  07/18/2024   Yearly kidney health urinalysis for diabetes  09/04/2024   Yearly kidney function blood test for diabetes  10/18/2024   DTaP/Tdap/Td vaccine (3 - Td or Tdap) 05/06/2031   Pneumococcal Vaccine for age over 25  Completed   Osteoporosis screening with Bone Density Scan  Completed   Meningitis B Vaccine  Aged Out       01/31/2024    1:37 PM  Advanced Directives  Does Patient Have a Medical Advance Directive? No  Would patient like information on creating a medical advance directive? No - Patient declined    Vision: Annual vision screenings are recommended for early detection of glaucoma, cataracts, and diabetic retinopathy. These exams can also reveal signs of chronic conditions such as diabetes and high blood pressure.  Dental: Annual dental screenings help detect early signs of oral  cancer, gum disease, and other conditions linked to overall health, including heart disease and diabetes.  Please see the attached documents for additional preventive care recommendations.

## 2024-02-15 ENCOUNTER — Other Ambulatory Visit: Payer: Self-pay | Admitting: Pharmacist

## 2024-02-15 MED ORDER — ATORVASTATIN CALCIUM 40 MG PO TABS: 40.0000 mg | ORAL_TABLET | Freq: Every day | ORAL | 0 refills | Status: AC

## 2024-02-15 NOTE — Progress Notes (Signed)
 Pharmacy Quality Measure Review  This patient is appearing on a report for the adherence measure for cholesterol (statin) and diabetes medications this calendar year.   Medication: atorvastatin  Last fill date: 09/21/23 for 90 day supply  Contacted pharmacy to facilitate refills.  Medication: pioglitazone  Last fill date: 01/31/24 for 90 day supply  Insurance report was not up to date. No action needed at this time.   Herlene Fleeta Morris, PharmD, JAQUELINE, CPP Clinical Pharmacist [facility] [phone]

## 2024-03-09 ENCOUNTER — Telehealth: Payer: Self-pay | Admitting: Family Medicine

## 2024-03-09 NOTE — Telephone Encounter (Signed)
 Pt unconfirmed appt

## 2024-03-12 ENCOUNTER — Ambulatory Visit: Admitting: Family Medicine

## 2024-04-30 ENCOUNTER — Ambulatory Visit: Payer: Self-pay | Admitting: Family Medicine

## 2024-10-10 ENCOUNTER — Ambulatory Visit: Payer: Self-pay | Admitting: Family Medicine
